# Patient Record
Sex: Female | Born: 1980 | Race: White | Hispanic: No | Marital: Married | Smoking: Current every day smoker
Health system: Southern US, Community
[De-identification: ages and names within clinical notes are randomized; demographics above are authoritative.]

## PROBLEM LIST (undated history)

## (undated) DIAGNOSIS — R32 Unspecified urinary incontinence: Secondary | ICD-10-CM

## (undated) DIAGNOSIS — E669 Obesity, unspecified: Secondary | ICD-10-CM

## (undated) DIAGNOSIS — E663 Overweight: Secondary | ICD-10-CM

## (undated) DIAGNOSIS — D649 Anemia, unspecified: Secondary | ICD-10-CM

## (undated) DIAGNOSIS — T7840XA Allergy, unspecified, initial encounter: Secondary | ICD-10-CM

## (undated) DIAGNOSIS — F329 Major depressive disorder, single episode, unspecified: Secondary | ICD-10-CM

## (undated) DIAGNOSIS — F419 Anxiety disorder, unspecified: Secondary | ICD-10-CM

## (undated) DIAGNOSIS — L732 Hidradenitis suppurativa: Secondary | ICD-10-CM

## (undated) DIAGNOSIS — R5383 Other fatigue: Secondary | ICD-10-CM

## (undated) DIAGNOSIS — K5900 Constipation, unspecified: Secondary | ICD-10-CM

## (undated) DIAGNOSIS — R05 Cough: Secondary | ICD-10-CM

## (undated) DIAGNOSIS — Z124 Encounter for screening for malignant neoplasm of cervix: Secondary | ICD-10-CM

## (undated) DIAGNOSIS — M2550 Pain in unspecified joint: Secondary | ICD-10-CM

## (undated) DIAGNOSIS — R1011 Right upper quadrant pain: Secondary | ICD-10-CM

## (undated) DIAGNOSIS — J209 Acute bronchitis, unspecified: Secondary | ICD-10-CM

## (undated) DIAGNOSIS — Z7251 High risk heterosexual behavior: Secondary | ICD-10-CM

## (undated) DIAGNOSIS — B019 Varicella without complication: Secondary | ICD-10-CM

## (undated) DIAGNOSIS — F319 Bipolar disorder, unspecified: Secondary | ICD-10-CM

## (undated) DIAGNOSIS — Z62819 Personal history of unspecified abuse in childhood: Secondary | ICD-10-CM

## (undated) DIAGNOSIS — E785 Hyperlipidemia, unspecified: Secondary | ICD-10-CM

## (undated) DIAGNOSIS — R5381 Other malaise: Secondary | ICD-10-CM

## (undated) DIAGNOSIS — Z62810 Personal history of physical and sexual abuse in childhood: Secondary | ICD-10-CM

## (undated) DIAGNOSIS — M79604 Pain in right leg: Secondary | ICD-10-CM

## (undated) DIAGNOSIS — F32A Depression, unspecified: Secondary | ICD-10-CM

## (undated) DIAGNOSIS — Z0000 Encounter for general adult medical examination without abnormal findings: Secondary | ICD-10-CM

## (undated) DIAGNOSIS — N643 Galactorrhea not associated with childbirth: Secondary | ICD-10-CM

## (undated) DIAGNOSIS — Z720 Tobacco use: Secondary | ICD-10-CM

## (undated) DIAGNOSIS — R4589 Other symptoms and signs involving emotional state: Secondary | ICD-10-CM

## (undated) HISTORY — DX: Bipolar disorder, unspecified: F31.9

## (undated) HISTORY — DX: Hyperlipidemia, unspecified: E78.5

## (undated) HISTORY — DX: Varicella without complication: B01.9

## (undated) HISTORY — DX: Pain in right leg: M79.604

## (undated) HISTORY — DX: Obesity, unspecified: E66.9

## (undated) HISTORY — DX: Unspecified urinary incontinence: R32

## (undated) HISTORY — DX: Personal history of physical and sexual abuse in childhood: Z62.810

## (undated) HISTORY — DX: Anemia, unspecified: D64.9

## (undated) HISTORY — DX: Other fatigue: R53.83

## (undated) HISTORY — DX: Anxiety disorder, unspecified: F41.9

## (undated) HISTORY — DX: Encounter for screening for malignant neoplasm of cervix: Z12.4

## (undated) HISTORY — DX: High risk heterosexual behavior: Z72.51

## (undated) HISTORY — DX: Right upper quadrant pain: R10.11

## (undated) HISTORY — DX: Overweight: E66.3

## (undated) HISTORY — DX: Constipation, unspecified: K59.00

## (undated) HISTORY — DX: Allergy, unspecified, initial encounter: T78.40XA

## (undated) HISTORY — DX: Galactorrhea not associated with childbirth: N64.3

## (undated) HISTORY — DX: Other malaise: R53.81

## (undated) HISTORY — DX: Major depressive disorder, single episode, unspecified: F32.9

## (undated) HISTORY — DX: Tobacco use: Z72.0

## (undated) HISTORY — DX: Other symptoms and signs involving emotional state: R45.89

## (undated) HISTORY — DX: Personal history of unspecified abuse in childhood: Z62.819

## (undated) HISTORY — DX: Encounter for general adult medical examination without abnormal findings: Z00.00

## (undated) HISTORY — DX: Hidradenitis suppurativa: L73.2

## (undated) HISTORY — DX: Cough: R05

## (undated) HISTORY — DX: Pain in unspecified joint: M25.50

## (undated) HISTORY — PX: WISDOM TOOTH EXTRACTION: SHX21

## (undated) HISTORY — DX: Acute bronchitis, unspecified: J20.9

## (undated) HISTORY — DX: Depression, unspecified: F32.A

---

## 2000-03-02 ENCOUNTER — Other Ambulatory Visit: Admission: RE | Admit: 2000-03-02 | Discharge: 2000-03-02 | Payer: Self-pay | Admitting: Family Medicine

## 2000-05-10 ENCOUNTER — Encounter: Payer: Self-pay | Admitting: Family Medicine

## 2000-05-10 ENCOUNTER — Ambulatory Visit (HOSPITAL_COMMUNITY): Admission: RE | Admit: 2000-05-10 | Discharge: 2000-05-10 | Payer: Self-pay | Admitting: Family Medicine

## 2000-05-22 ENCOUNTER — Encounter: Payer: Self-pay | Admitting: Family Medicine

## 2000-05-22 ENCOUNTER — Ambulatory Visit (HOSPITAL_COMMUNITY): Admission: RE | Admit: 2000-05-22 | Discharge: 2000-05-22 | Payer: Self-pay | Admitting: Family Medicine

## 2000-07-17 ENCOUNTER — Other Ambulatory Visit: Admission: RE | Admit: 2000-07-17 | Discharge: 2000-07-17 | Payer: Self-pay | Admitting: Family Medicine

## 2000-07-17 ENCOUNTER — Other Ambulatory Visit: Admission: RE | Admit: 2000-07-17 | Discharge: 2000-07-17 | Payer: Self-pay | Admitting: *Deleted

## 2000-08-22 ENCOUNTER — Encounter: Payer: Self-pay | Admitting: Family Medicine

## 2000-08-22 ENCOUNTER — Ambulatory Visit (HOSPITAL_COMMUNITY): Admission: RE | Admit: 2000-08-22 | Discharge: 2000-08-22 | Payer: Self-pay | Admitting: Family Medicine

## 2000-10-02 ENCOUNTER — Inpatient Hospital Stay (HOSPITAL_COMMUNITY): Admission: AD | Admit: 2000-10-02 | Discharge: 2000-10-02 | Payer: Self-pay | Admitting: Family Medicine

## 2000-11-07 ENCOUNTER — Inpatient Hospital Stay (HOSPITAL_COMMUNITY): Admission: AD | Admit: 2000-11-07 | Discharge: 2000-11-07 | Payer: Self-pay | Admitting: Family Medicine

## 2001-01-02 ENCOUNTER — Inpatient Hospital Stay (HOSPITAL_COMMUNITY): Admission: AD | Admit: 2001-01-02 | Discharge: 2001-01-02 | Payer: Self-pay | Admitting: Family Medicine

## 2001-01-05 ENCOUNTER — Inpatient Hospital Stay (HOSPITAL_COMMUNITY): Admission: AD | Admit: 2001-01-05 | Discharge: 2001-01-07 | Payer: Self-pay | Admitting: Family Medicine

## 2001-02-11 ENCOUNTER — Other Ambulatory Visit: Admission: RE | Admit: 2001-02-11 | Discharge: 2001-02-11 | Payer: Self-pay | Admitting: Family Medicine

## 2007-06-27 DIAGNOSIS — F319 Bipolar disorder, unspecified: Secondary | ICD-10-CM

## 2007-06-27 HISTORY — DX: Bipolar disorder, unspecified: F31.9

## 2008-04-08 ENCOUNTER — Inpatient Hospital Stay (HOSPITAL_COMMUNITY): Admission: RE | Admit: 2008-04-08 | Discharge: 2008-04-11 | Payer: Self-pay | Admitting: Psychiatry

## 2008-04-08 ENCOUNTER — Ambulatory Visit: Payer: Self-pay | Admitting: Psychiatry

## 2008-04-08 ENCOUNTER — Emergency Department (HOSPITAL_COMMUNITY): Admission: EM | Admit: 2008-04-08 | Discharge: 2008-04-08 | Payer: Self-pay | Admitting: Emergency Medicine

## 2009-03-26 HISTORY — PX: OTHER SURGICAL HISTORY: SHX169

## 2010-11-11 NOTE — Discharge Summary (Signed)
NAME:  Phyllis Washington, Phyllis Washington NO.:  [id number]   MEDICAL RECORD NO.:  [id number]          PATIENT TYPE:  IPS   LOCATION:  0303                          FACILITY:  [facility]   PHYSICIAN:  Anselm Jungling, MD  DATE OF BIRTH:  01-07-1981   DATE OF ADMISSION:  04/08/2008  DATE OF DISCHARGE:  04/11/2008                               DISCHARGE SUMMARY   IDENTIFYING DATA AND REASON FOR ADMISSION:  The patient is a 30 year old  Caucasian female who was admitted to the inpatient psychiatric service  via [facility].  The patient stated, I  was upset, it was a misunderstanding, I don't need to be here.  She did  admit to depression but denied any suicidal ideation, intent, or plan.  She had apparently made some significant suicide threats verbally, in  the presence of her husband prior to admission.  This was her first [facility]  admission.  She came to [place] on a regimen of Celexa, Haldol, and Xanax.  Please refer to the admission note for further details pertaining to the  symptoms, circumstances, and history that led to hospitalization.  She  was given an initial Axis I diagnosis of depressive disorder NOS.   MEDICAL AND LABORATORY:  The patient was medically and physically  assessed by the psychiatric nurse practitioner.  She was in good health  without any active or chronic medical problems.  There were no  significant medical issues.   HOSPITAL COURSE:  The patient was admitted to the adult inpatient  psychiatric service.  She presented as a well-nourished, normally-  developed female who appeared alert and fully-oriented.  She was  depressed, with anxious and blunted affect.  She made no overtly  delusional statements and did not appear to be psychotic.  She was  extremely focused on being discharged home as soon as possible and  minimized the statements she had made prior to admission that had led to  concern and her hospitalization.  She consented for her  family to be  contacted, and her husband was reached by the case manager.  The husband  indicated that the patient had been severely depressed and unable to  function at home over many months.  He appeared to be highly invested in  the patient getting treatment.  Although the patient continued to plea  to go home and signed a 72-hour release request form, it was felt that  she was desperately in need of inpatient care for her depression.  She  indicated that she did not want to take Haldol or Celexa any longer.  However, she consented to a trial of Risperdal in low doses, initially  at 0.25 mg t.i.d., to which she had a good response.   On the fourth hospital day, the patient still indicated that she wanted  to go home.  She had absent suicidal ideation throughout her entire  stay, was less depressed, and less anxious.  At this point, her husband  was in favor of her returning home.  She agreed to the following  aftercare plan.   AFTERCARE:  The  patient was to follow-up with Cl[facility] Ridges with an  appointment on April 11, 2008.   DISCHARGE MEDICATIONS:  1. Risperdal 0.25 mg t.i.d.  2. Xanax 1 mg every 6 hours p.r.n. anxiety.  3. Ambien 10 mg q.h.s. as needed for sleep.   DISCHARGE DIAGNOSES:  Axis I: Mood disorder NOS.  Axis II: Borderline personality traits.  Axis III: No acute or chronic illnesses.  Axis IV: Stressors severe.  Axis V: GAF on discharge 55.      Anselm Jungling, MD  Electronically Signed     SPB/MEDQ  D:  04/16/2008  T:  04/16/2008  Job:  [id number]

## 2011-03-28 LAB — CBC
HCT: 41.9
MCHC: 34.3
MCV: 89
Platelets: 270
RBC: 4.7
WBC: 10.5

## 2011-03-28 LAB — URINALYSIS, ROUTINE W REFLEX MICROSCOPIC
Bilirubin Urine: NEGATIVE
Glucose, UA: NEGATIVE
Hgb urine dipstick: NEGATIVE
Nitrite: NEGATIVE
Protein, ur: NEGATIVE
Specific Gravity, Urine: 1.01
Urobilinogen, UA: 0.2
pH: 6

## 2011-03-28 LAB — RAPID URINE DRUG SCREEN, HOSP PERFORMED
Amphetamines: NOT DETECTED
Barbiturates: NOT DETECTED
Benzodiazepines: POSITIVE — AB
Cocaine: NOT DETECTED
Opiates: NOT DETECTED
Tetrahydrocannabinol: POSITIVE — AB

## 2011-03-28 LAB — DIFFERENTIAL
Basophils Absolute: 0
Basophils Relative: 0
Eosinophils Absolute: 0.1
Eosinophils Relative: 1
Lymphocytes Relative: 20
Lymphs Abs: 2.1
Monocytes Absolute: 0.5
Monocytes Relative: 5
Neutro Abs: 7.9 — ABNORMAL HIGH
Neutrophils Relative %: 75

## 2011-03-28 LAB — PREGNANCY, URINE: Preg Test, Ur: NEGATIVE

## 2011-03-28 LAB — BASIC METABOLIC PANEL
BUN: 7
CO2: 25
Chloride: 105
Potassium: 3.7

## 2011-03-28 LAB — ETHANOL: Alcohol, Ethyl (B): <5

## 2012-04-16 ENCOUNTER — Encounter: Payer: Self-pay | Admitting: Family Medicine

## 2012-04-16 ENCOUNTER — Ambulatory Visit (INDEPENDENT_AMBULATORY_CARE_PROVIDER_SITE_OTHER): Payer: Medicare Other | Admitting: Family Medicine

## 2012-04-16 VITALS — BP 119/81 | HR 73 | Temp 97.6°F | Ht 66.0 in | Wt 220.4 lb

## 2012-04-16 DIAGNOSIS — E669 Obesity, unspecified: Secondary | ICD-10-CM | POA: Insufficient documentation

## 2012-04-16 DIAGNOSIS — Z1239 Encounter for other screening for malignant neoplasm of breast: Secondary | ICD-10-CM | POA: Insufficient documentation

## 2012-04-16 DIAGNOSIS — E663 Overweight: Secondary | ICD-10-CM

## 2012-04-16 DIAGNOSIS — M79609 Pain in unspecified limb: Secondary | ICD-10-CM

## 2012-04-16 DIAGNOSIS — Z0000 Encounter for general adult medical examination without abnormal findings: Secondary | ICD-10-CM

## 2012-04-16 DIAGNOSIS — Z62810 Personal history of physical and sexual abuse in childhood: Secondary | ICD-10-CM | POA: Insufficient documentation

## 2012-04-16 DIAGNOSIS — M79604 Pain in right leg: Secondary | ICD-10-CM | POA: Insufficient documentation

## 2012-04-16 DIAGNOSIS — Z23 Encounter for immunization: Secondary | ICD-10-CM

## 2012-04-16 DIAGNOSIS — Z62819 Personal history of unspecified abuse in childhood: Secondary | ICD-10-CM | POA: Insufficient documentation

## 2012-04-16 DIAGNOSIS — D649 Anemia, unspecified: Secondary | ICD-10-CM

## 2012-04-16 DIAGNOSIS — S82209A Unspecified fracture of shaft of unspecified tibia, initial encounter for closed fracture: Secondary | ICD-10-CM

## 2012-04-16 DIAGNOSIS — F319 Bipolar disorder, unspecified: Secondary | ICD-10-CM

## 2012-04-16 HISTORY — DX: Pain in right leg: M79.604

## 2012-04-16 HISTORY — DX: Encounter for general adult medical examination without abnormal findings: Z00.00

## 2012-04-16 NOTE — Assessment & Plan Note (Signed)
Presently has a therapist she is happy with and a psychiatrist who prescribes her meds. Her disability, Medicare and Medicaid have just been approved so she may have to change practitoners for insurance reasons.

## 2012-04-16 NOTE — Assessment & Plan Note (Signed)
Given handout on DASH diet and check labs including tsh, consider Phentermine if needed at next visit

## 2012-04-16 NOTE — Assessment & Plan Note (Signed)
Check CBC with fasting labs

## 2012-04-16 NOTE — Assessment & Plan Note (Signed)
Flu shot given today, tdap in 2010, has appt with GYN next week. Check fasting labs and see patient back in 1 month or sooner as needed.

## 2012-04-16 NOTE — Assessment & Plan Note (Signed)
Has not had insurance or medical care for several years. Her disability has been finaliized and she is in a great deal of pain still so we will refer to orthopaedics for further evaluation and treatment
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
## 2012-04-16 NOTE — Patient Instructions (Addendum)
Preventive Care for Adults, Female A healthy lifestyle and preventive care can promote health and wellness. Preventive health guidelines for women include the following key practices.  A routine yearly physical is a good way to check with your caregiver about your health and preventive screening. It is a chance to share any concerns and updates on your health, and to receive a thorough exam.  Visit your dentist for a routine exam and preventive care every 6 months. Brush your teeth twice a day and floss once a day. Good oral hygiene prevents tooth decay and gum disease.  The frequency of eye exams is based on your age, health, family medical history, use of contact lenses, and other factors. Follow your caregiver's recommendations for frequency of eye exams.  Eat a healthy diet. Foods like vegetables, fruits, whole grains, low-fat dairy products, and lean protein foods contain the nutrients you need without too many calories. Decrease your intake of foods high in solid fats, added sugars, and salt. Eat the right amount of calories for you.Get information about a proper diet from your caregiver, if necessary.  Regular physical exercise is one of the most important things you can do for your health. Most adults should get at least 150 minutes of moderate-intensity exercise (any activity that increases your heart rate and causes you to sweat) each week. In addition, most adults need muscle-strengthening exercises on 2 or more days a week.  Maintain a healthy weight. The body mass index (BMI) is a screening tool to identify possible weight problems. It provides an estimate of body fat based on height and weight. Your caregiver can help determine your BMI, and can help you achieve or maintain a healthy weight.For adults 20 years and older:  A BMI below 18.5 is considered underweight.  A BMI of 18.5 to 24.9 is normal.  A BMI of 25 to 29.9 is considered overweight.  A BMI of 30 and above is  considered obese.  Maintain normal blood lipids and cholesterol levels by exercising and minimizing your intake of saturated fat. Eat a balanced diet with plenty of fruit and vegetables. Blood tests for lipids and cholesterol should begin at age 20 and be repeated every 5 years. If your lipid or cholesterol levels are high, you are over 50, or you are at high risk for heart disease, you may need your cholesterol levels checked more frequently.Ongoing high lipid and cholesterol levels should be treated with medicines if diet and exercise are not effective.  If you smoke, find out from your caregiver how to quit. If you do not use tobacco, do not start.  If you are pregnant, do not drink alcohol. If you are breastfeeding, be very cautious about drinking alcohol. If you are not pregnant and choose to drink alcohol, do not exceed 1 drink per day. One drink is considered to be 12 ounces (355 mL) of beer, 5 ounces (148 mL) of wine, or 1.5 ounces (44 mL) of liquor.  Avoid use of street drugs. Do not share needles with anyone. Ask for help if you need support or instructions about stopping the use of drugs.  High blood pressure causes heart disease and increases the risk of stroke. Your blood pressure should be checked at least every 1 to 2 years. Ongoing high blood pressure should be treated with medicines if weight loss and exercise are not effective.  If you are 55 to 33 years old, ask your caregiver if you should take aspirin to prevent strokes.  Diabetes   screening involves taking a blood sample to check your fasting blood sugar level. This should be done once every 3 years, after age 45, if you are within normal weight and without risk factors for diabetes. Testing should be considered at a younger age or be carried out more frequently if you are overweight and have at least 1 risk factor for diabetes.  Breast cancer screening is essential preventive care for women. You should practice "breast  self-awareness." This means understanding the normal appearance and feel of your breasts and may include breast self-examination. Any changes detected, no matter how small, should be reported to a caregiver. Women in their 20s and 30s should have a clinical breast exam (CBE) by a caregiver as part of a regular health exam every 1 to 3 years. After age 40, women should have a CBE every year. Starting at age 40, women should consider having a mammography (breast X-ray test) every year. Women who have a family history of breast cancer should talk to their caregiver about genetic screening. Women at a high risk of breast cancer should talk to their caregivers about having magnetic resonance imaging (MRI) and a mammography every year.  The Pap test is a screening test for cervical cancer. A Pap test can show cell changes on the cervix that might become cervical cancer if left untreated. A Pap test is a procedure in which cells are obtained and examined from the lower end of the uterus (cervix).  Women should have a Pap test starting at age 21.  Between ages 21 and 29, Pap tests should be repeated every 2 years.  Beginning at age 30, you should have a Pap test every 3 years as long as the past 3 Pap tests have been normal.  Some women have medical problems that increase the chance of getting cervical cancer. Talk to your caregiver about these problems. It is especially important to talk to your caregiver if a new problem develops soon after your last Pap test. In these cases, your caregiver may recommend more frequent screening and Pap tests.  The above recommendations are the same for women who have or have not gotten the vaccine for human papillomavirus (HPV).  If you had a hysterectomy for a problem that was not cancer or a condition that could lead to cancer, then you no longer need Pap tests. Even if you no longer need a Pap test, a regular exam is a good idea to make sure no other problems are  starting.  If you are between ages 65 and 70, and you have had normal Pap tests going back 10 years, you no longer need Pap tests. Even if you no longer need a Pap test, a regular exam is a good idea to make sure no other problems are starting.  If you have had past treatment for cervical cancer or a condition that could lead to cancer, you need Pap tests and screening for cancer for at least 20 years after your treatment.  If Pap tests have been discontinued, risk factors (such as a new sexual partner) need to be reassessed to determine if screening should be resumed.  The HPV test is an additional test that may be used for cervical cancer screening. The HPV test looks for the virus that can cause the cell changes on the cervix. The cells collected during the Pap test can be tested for HPV. The HPV test could be used to screen women aged 30 years and older, and should   be used in women of any age who have unclear Pap test results. After the age of 30, women should have HPV testing at the same frequency as a Pap test.  Colorectal cancer can be detected and often prevented. Most routine colorectal cancer screening begins at the age of 50 and continues through age 75. However, your caregiver may recommend screening at an earlier age if you have risk factors for colon cancer. On a yearly basis, your caregiver may provide home test kits to check for hidden blood in the stool. Use of a small camera at the end of a tube, to directly examine the colon (sigmoidoscopy or colonoscopy), can detect the earliest forms of colorectal cancer. Talk to your caregiver about this at age 50, when routine screening begins. Direct examination of the colon should be repeated every 5 to 10 years through age 75, unless early forms of pre-cancerous polyps or small growths are found.  Hepatitis C blood testing is recommended for all people born from 1945 through 1965 and any individual with known risks for hepatitis C.  Practice  safe sex. Use condoms and avoid high-risk sexual practices to reduce the spread of sexually transmitted infections (STIs). STIs include gonorrhea, chlamydia, syphilis, trichomonas, herpes, HPV, and human immunodeficiency virus (HIV). Herpes, HIV, and HPV are viral illnesses that have no cure. They can result in disability, cancer, and death. Sexually active women aged 25 and younger should be checked for chlamydia. Older women with new or multiple partners should also be tested for chlamydia. Testing for other STIs is recommended if you are sexually active and at increased risk.  Osteoporosis is a disease in which the bones lose minerals and strength with aging. This can result in serious bone fractures. The risk of osteoporosis can be identified using a bone density scan. Women ages 65 and over and women at risk for fractures or osteoporosis should discuss screening with their caregivers. Ask your caregiver whether you should take a calcium supplement or vitamin D to reduce the rate of osteoporosis.  Menopause can be associated with physical symptoms and risks. Hormone replacement therapy is available to decrease symptoms and risks. You should talk to your caregiver about whether hormone replacement therapy is right for you.  Use sunscreen with sun protection factor (SPF) of 30 or more. Apply sunscreen liberally and repeatedly throughout the day. You should seek shade when your shadow is shorter than you. Protect yourself by wearing long sleeves, pants, a wide-brimmed hat, and sunglasses year round, whenever you are outdoors.  Once a month, do a whole body skin exam, using a mirror to look at the skin on your back. Notify your caregiver of new moles, moles that have irregular borders, moles that are larger than a pencil eraser, or moles that have changed in shape or color.  Stay current with required immunizations.  Influenza. You need a dose every fall (or winter). The composition of the flu vaccine  changes each year, so being vaccinated once is not enough.  Pneumococcal polysaccharide. You need 1 to 2 doses if you smoke cigarettes or if you have certain chronic medical conditions. You need 1 dose at age 65 (or older) if you have never been vaccinated.  Tetanus, diphtheria, pertussis (Tdap, Td). Get 1 dose of Tdap vaccine if you are younger than age 65, are over 65 and have contact with an infant, are a healthcare worker, are pregnant, or simply want to be protected from whooping cough. After that, you need a Td   booster dose every 10 years. Consult your caregiver if you have not had at least 3 tetanus and diphtheria-containing shots sometime in your life or have a deep or dirty wound.  HPV. You need this vaccine if you are a woman age 26 or younger. The vaccine is given in 3 doses over 6 months.  Measles, mumps, rubella (MMR). You need at least 1 dose of MMR if you were born in 1957 or later. You may also need a second dose.  Meningococcal. If you are age 19 to 21 and a first-year college student living in a residence hall, or have one of several medical conditions, you need to get vaccinated against meningococcal disease. You may also need additional booster doses.  Zoster (shingles). If you are age 60 or older, you should get this vaccine.  Varicella (chickenpox). If you have never had chickenpox or you were vaccinated but received only 1 dose, talk to your caregiver to find out if you need this vaccine.  Hepatitis A. You need this vaccine if you have a specific risk factor for hepatitis A virus infection or you simply wish to be protected from this disease. The vaccine is usually given as 2 doses, 6 to 18 months apart.  Hepatitis B. You need this vaccine if you have a specific risk factor for hepatitis B virus infection or you simply wish to be protected from this disease. The vaccine is given in 3 doses, usually over 6 months. Preventive Services / Frequency Ages 19 to 39  Blood  pressure check.** / Every 1 to 2 years.  Lipid and cholesterol check.** / Every 5 years beginning at age 20.  Clinical breast exam.** / Every 3 years for women in their 20s and 30s.  Pap test.** / Every 2 years from ages 21 through 29. Every 3 years starting at age 30 through age 65 or 70 with a history of 3 consecutive normal Pap tests.  HPV screening.** / Every 3 years from ages 30 through ages 65 to 70 with a history of 3 consecutive normal Pap tests.  Hepatitis C blood test.** / For any individual with known risks for hepatitis C.  Skin self-exam. / Monthly.  Influenza immunization.** / Every year.  Pneumococcal polysaccharide immunization.** / 1 to 2 doses if you smoke cigarettes or if you have certain chronic medical conditions.  Tetanus, diphtheria, pertussis (Tdap, Td) immunization. / A one-time dose of Tdap vaccine. After that, you need a Td booster dose every 10 years.  HPV immunization. / 3 doses over 6 months, if you are 26 and younger.  Measles, mumps, rubella (MMR) immunization. / You need at least 1 dose of MMR if you were born in 1957 or later. You may also need a second dose.  Meningococcal immunization. / 1 dose if you are age 19 to 21 and a first-year college student living in a residence hall, or have one of several medical conditions, you need to get vaccinated against meningococcal disease. You may also need additional booster doses.  Varicella immunization.** / Consult your caregiver.  Hepatitis A immunization.** / Consult your caregiver. 2 doses, 6 to 18 months apart.  Hepatitis B immunization.** / Consult your caregiver. 3 doses usually over 6 months. Ages 40 to 64  Blood pressure check.** / Every 1 to 2 years.  Lipid and cholesterol check.** / Every 5 years beginning at age 20.  Clinical breast exam.** / Every year after age 40.  Mammogram.** / Every year beginning at age 40   and continuing for as long as you are in good health. Consult with your  caregiver.  Pap test.** / Every 3 years starting at age 30 through age 65 or 70 with a history of 3 consecutive normal Pap tests.  HPV screening.** / Every 3 years from ages 30 through ages 65 to 70 with a history of 3 consecutive normal Pap tests.  Fecal occult blood test (FOBT) of stool. / Every year beginning at age 50 and continuing until age 75. You may not need to do this test if you get a colonoscopy every 10 years.  Flexible sigmoidoscopy or colonoscopy.** / Every 5 years for a flexible sigmoidoscopy or every 10 years for a colonoscopy beginning at age 50 and continuing until age 75.  Hepatitis C blood test.** / For all people born from 1945 through 1965 and any individual with known risks for hepatitis C.  Skin self-exam. / Monthly.  Influenza immunization.** / Every year.  Pneumococcal polysaccharide immunization.** / 1 to 2 doses if you smoke cigarettes or if you have certain chronic medical conditions.  Tetanus, diphtheria, pertussis (Tdap, Td) immunization.** / A one-time dose of Tdap vaccine. After that, you need a Td booster dose every 10 years.  Measles, mumps, rubella (MMR) immunization. / You need at least 1 dose of MMR if you were born in 1957 or later. You may also need a second dose.  Varicella immunization.** / Consult your caregiver.  Meningococcal immunization.** / Consult your caregiver.  Hepatitis A immunization.** / Consult your caregiver. 2 doses, 6 to 18 months apart.  Hepatitis B immunization.** / Consult your caregiver. 3 doses, usually over 6 months. Ages 65 and over  Blood pressure check.** / Every 1 to 2 years.  Lipid and cholesterol check.** / Every 5 years beginning at age 20.  Clinical breast exam.** / Every year after age 40.  Mammogram.** / Every year beginning at age 40 and continuing for as long as you are in good health. Consult with your caregiver.  Pap test.** / Every 3 years starting at age 30 through age 65 or 70 with a 3  consecutive normal Pap tests. Testing can be stopped between 65 and 70 with 3 consecutive normal Pap tests and no abnormal Pap or HPV tests in the past 10 years.  HPV screening.** / Every 3 years from ages 30 through ages 65 or 70 with a history of 3 consecutive normal Pap tests. Testing can be stopped between 65 and 70 with 3 consecutive normal Pap tests and no abnormal Pap or HPV tests in the past 10 years.  Fecal occult blood test (FOBT) of stool. / Every year beginning at age 50 and continuing until age 75. You may not need to do this test if you get a colonoscopy every 10 years.  Flexible sigmoidoscopy or colonoscopy.** / Every 5 years for a flexible sigmoidoscopy or every 10 years for a colonoscopy beginning at age 50 and continuing until age 75.  Hepatitis C blood test.** / For all people born from 1945 through 1965 and any individual with known risks for hepatitis C.  Osteoporosis screening.** / A one-time screening for women ages 65 and over and women at risk for fractures or osteoporosis.  Skin self-exam. / Monthly.  Influenza immunization.** / Every year.  Pneumococcal polysaccharide immunization.** / 1 dose at age 65 (or older) if you have never been vaccinated.  Tetanus, diphtheria, pertussis (Tdap, Td) immunization. / A one-time dose of Tdap vaccine if you are over   65 and have contact with an infant, are a healthcare worker, or simply want to be protected from whooping cough. After that, you need a Td booster dose every 10 years.  Varicella immunization.** / Consult your caregiver.  Meningococcal immunization.** / Consult your caregiver.  Hepatitis A immunization.** / Consult your caregiver. 2 doses, 6 to 18 months apart.  Hepatitis B immunization.** / Check with your caregiver. 3 doses, usually over 6 months. ** Family history and personal history of risk and conditions may change your caregiver's recommendations. Document Released: 08/08/2001 Document Revised: 09/04/2011  Document Reviewed: 11/07/2010 [employer] Patient Information 2013 [employer].  

## 2012-04-17 ENCOUNTER — Other Ambulatory Visit: Payer: Medicare Other

## 2012-04-18 ENCOUNTER — Other Ambulatory Visit (INDEPENDENT_AMBULATORY_CARE_PROVIDER_SITE_OTHER): Payer: Medicare Other

## 2012-04-18 DIAGNOSIS — D649 Anemia, unspecified: Secondary | ICD-10-CM

## 2012-04-18 DIAGNOSIS — E663 Overweight: Secondary | ICD-10-CM

## 2012-04-18 DIAGNOSIS — Z0000 Encounter for general adult medical examination without abnormal findings: Secondary | ICD-10-CM

## 2012-04-18 LAB — CBC
HCT: 40 % (ref 36.0–46.0)
MCV: 92.2 fl (ref 78.0–100.0)
Platelets: 257 10*3/uL (ref 150.0–400.0)
RBC: 4.34 Mil/uL (ref 3.87–5.11)

## 2012-04-19 LAB — HEPATIC FUNCTION PANEL
Bilirubin, Direct: 0 mg/dL (ref 0.0–0.3)
Total Bilirubin: 0.3 mg/dL (ref 0.3–1.2)

## 2012-04-19 LAB — TSH: TSH: 0.73 u[IU]/mL (ref 0.35–5.50)

## 2012-04-19 LAB — RENAL FUNCTION PANEL
Albumin: 3.7 g/dL (ref 3.5–5.2)
CO2: 18 mEq/L — ABNORMAL LOW (ref 19–32)
Calcium: 9 mg/dL (ref 8.4–10.5)
Chloride: 106 mEq/L (ref 96–112)
Phosphorus: 2.8 mg/dL (ref 2.3–4.6)
Potassium: 4.4 mEq/L (ref 3.5–5.1)

## 2012-04-19 LAB — LIPID PANEL
LDL Cholesterol: 119 mg/dL — ABNORMAL HIGH (ref 0–99)
Total CHOL/HDL Ratio: 4
VLDL: 18.6 mg/dL (ref 0.0–40.0)

## 2012-04-23 NOTE — Progress Notes (Signed)
Quick Note:  Patient Informed and voiced understanding ______ 

## 2012-04-24 ENCOUNTER — Ambulatory Visit: Payer: Medicare Other | Admitting: Family Medicine

## 2012-04-26 ENCOUNTER — Ambulatory Visit: Payer: Medicare Other | Admitting: Family Medicine

## 2012-05-08 ENCOUNTER — Encounter: Payer: Self-pay | Admitting: Family Medicine

## 2012-05-08 ENCOUNTER — Ambulatory Visit (INDEPENDENT_AMBULATORY_CARE_PROVIDER_SITE_OTHER): Payer: Medicare Other | Admitting: Family Medicine

## 2012-05-08 VITALS — BP 121/83 | HR 74 | Temp 98.2°F | Ht 66.0 in | Wt 222.8 lb

## 2012-05-08 DIAGNOSIS — Z720 Tobacco use: Secondary | ICD-10-CM | POA: Insufficient documentation

## 2012-05-08 DIAGNOSIS — E663 Overweight: Secondary | ICD-10-CM

## 2012-05-08 DIAGNOSIS — F17200 Nicotine dependence, unspecified, uncomplicated: Secondary | ICD-10-CM

## 2012-05-08 HISTORY — DX: Tobacco use: Z72.0

## 2012-05-08 MED ORDER — PHENTERMINE HCL 15 MG PO CAPS: 15.0000 mg | ORAL_CAPSULE | ORAL | Status: DC
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
## 2012-05-08 NOTE — Assessment & Plan Note (Signed)
Encouraged DASH diet, increased exercise, 8 hours of sleep and started on Phentermine 15 mg daily.

## 2012-05-08 NOTE — Patient Instructions (Addendum)
Start a probiotic such as [employer] by [employer] Avoid trans fats/partially hydrogenated oil 64 oz of clear fluids, avoid diet drinks Add Metamucil powder daily    Calorie Counting Diet A calorie counting diet requires you to eat the number of calories that are right for you in a day. Calories are the measurement of how much energy you get from the food you eat. Eating the right amount of calories is important for staying at a healthy weight. If you eat too many calories, your body will store them as fat and you may gain weight. If you eat too few calories, you may lose weight. Counting the number of calories you eat during a day will help you know if you are eating the right amount. A Registered Dietitian can determine how many calories you need in a day. The amount of calories needed varies from person to person. If your goal is to lose weight, you will need to eat fewer calories. Losing weight can benefit you if you are overweight or have health problems such as heart disease, high blood pressure, or diabetes. If your goal is to gain weight, you will need to eat more calories. Gaining weight may be necessary if you have a certain health problem that causes your body to need more energy. TIPS Whether you are increasing or decreasing the number of calories you eat during a day, it may be hard to get used to changes in what you eat and drink. The following are tips to help you keep track of the number of calories you eat.  Measure foods at home with measuring cups. This helps you know the amount of food and number of calories you are eating.  Restaurants often serve food in amounts that are larger than 1 serving. While eating out, estimate how many servings of a food you are given. For example, a serving of cooked rice is  cup or about the size of half of a fist. Knowing serving sizes will help you be aware of how much food you are eating at restaurants.  Ask for smaller portion sizes or  child-size portions at restaurants.  Plan to eat half of a meal at a restaurant. Take the rest home or share the other half with a friend.  Read the Nutrition Facts panel on food labels for calorie content and serving size. You can find out how many servings are in a package, the size of a serving, and the number of calories each serving has.  For example, a package might contain 3 cookies. The Nutrition Facts panel on that package says that 1 serving is 1 cookie. Below that, it will say there are 3 servings in the container. The calories section of the Nutrition Facts label says there are 90 calories. This means there are 90 calories in 1 cookie (1 serving). If you eat 1 cookie you have eaten 90 calories. If you eat all 3 cookies, you have eaten 270 calories (3 servings x 90 calories = 270 calories). The list below tells you how big or small some common portion sizes are.  1 oz.........4 stacked dice.  3 oz........Marland KitchenDeck of cards.  1 tsp.......Marland KitchenTip of little finger.  1 tbs......Marland KitchenMarland KitchenThumb.  2 tbs.......Marland KitchenGolf ball.   cup......Marland KitchenHalf of a fist.  1 cup.......Marland KitchenA fist. KEEP A FOOD LOG Write down every food item you eat, the amount you eat, and the number of calories in each food you eat during the day. At the end of the day, you can  add up the total number of calories you have eaten. It may help to keep a list like the one below. Find out the calorie information by reading the Nutrition Facts panel on food labels. Breakfast  Bran cereal (1 cup, 110 calories).  Fat-free milk ( cup, 45 calories). Snack  Apple (1 medium, 80 calories). Lunch  Spinach (1 cup, 20 calories).  Tomato ( medium, 20 calories).  Chicken breast strips (3 oz, 165 calories).  Shredded cheddar cheese ( cup, 110 calories).  Light [place] dressing (2 tbs, 60 calories).  Whole-wheat bread (1 slice, 80 calories).  Tub margarine (1 tsp, 35 calories).  Vegetable soup (1 cup, 160 calories). Dinner  Pork chop  (3 oz, 190 calories).  Brown rice (1 cup, 215 calories).  Steamed broccoli ( cup, 20 calories).  Strawberries (1  cup, 65 calories).  Whipped cream (1 tbs, 50 calories). Daily Calorie Total: 1425 Document Released: 06/12/2005 Document Revised: 09/04/2011 Document Reviewed: 12/07/2006 [facility] Patient Information 2013 [place], [place].

## 2012-05-08 NOTE — Assessment & Plan Note (Signed)
Discussed need for cessation 

## 2012-05-09 ENCOUNTER — Telehealth: Payer: Self-pay | Admitting: Family Medicine

## 2012-05-09 ENCOUNTER — Encounter: Payer: Self-pay | Admitting: Family Medicine

## 2012-05-09 NOTE — Telephone Encounter (Signed)
Patient needs to know what type of soap Dr Abner Greenspan wanted her to use. Is it OTC or does she need an Rx?

## 2012-05-09 NOTE — Telephone Encounter (Signed)
Cetaphil soap is over the counter and she can get it at any pharmacy

## 2012-05-09 NOTE — Telephone Encounter (Signed)
Advised patient

## 2012-05-29 ENCOUNTER — Ambulatory Visit (INDEPENDENT_AMBULATORY_CARE_PROVIDER_SITE_OTHER): Payer: Medicare Other | Admitting: Family Medicine

## 2012-05-29 ENCOUNTER — Encounter: Payer: Self-pay | Admitting: Family Medicine

## 2012-05-29 VITALS — BP 121/81 | HR 75 | Temp 97.8°F | Ht 66.0 in | Wt 221.0 lb

## 2012-05-29 DIAGNOSIS — E663 Overweight: Secondary | ICD-10-CM

## 2012-05-29 DIAGNOSIS — E669 Obesity, unspecified: Secondary | ICD-10-CM

## 2012-05-29 MED ORDER — PHENTERMINE HCL 37.5 MG PO CAPS: 37.5000 mg | ORAL_CAPSULE | ORAL | Status: DC

## 2012-05-29 NOTE — Patient Instructions (Addendum)
Walk

## 2012-05-29 NOTE — Assessment & Plan Note (Signed)
Has noted increased energy and weight loss secondary to decreased appetite starting phentermine 15. No concerning side effects. Will increase to phentermine 37.5 mg daily and reassess vital signs and symptoms and 3 weeks' time or as needed. Encouraged a daily walk and increase exercise as well as maintaining a low calorie high fiber and hi protein diet
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
## 2012-06-18 ENCOUNTER — Ambulatory Visit: Payer: Medicare Other | Admitting: Family Medicine

## 2012-06-29 ENCOUNTER — Other Ambulatory Visit: Payer: Self-pay | Admitting: Family Medicine

## 2012-07-01 NOTE — Telephone Encounter (Signed)
Please advise phentermine refill? Last RX was wrote on 05-29-12 quantity 30 with 0 refills

## 2012-07-02 ENCOUNTER — Ambulatory Visit (INDEPENDENT_AMBULATORY_CARE_PROVIDER_SITE_OTHER): Payer: Medicare Other | Admitting: Family Medicine

## 2012-07-02 ENCOUNTER — Encounter: Payer: Self-pay | Admitting: Family Medicine

## 2012-07-02 VITALS — BP 119/87 | HR 95 | Temp 98.0°F | Ht 66.0 in | Wt 215.1 lb

## 2012-07-02 DIAGNOSIS — Z720 Tobacco use: Secondary | ICD-10-CM

## 2012-07-02 DIAGNOSIS — F319 Bipolar disorder, unspecified: Secondary | ICD-10-CM

## 2012-07-02 DIAGNOSIS — E663 Overweight: Secondary | ICD-10-CM

## 2012-07-02 DIAGNOSIS — E669 Obesity, unspecified: Secondary | ICD-10-CM

## 2012-07-02 DIAGNOSIS — F17200 Nicotine dependence, unspecified, uncomplicated: Secondary | ICD-10-CM

## 2012-07-02 MED ORDER — PHENTERMINE HCL 37.5 MG PO CAPS: 37.5000 mg | ORAL_CAPSULE | ORAL | Status: DC

## 2012-07-02 NOTE — Patient Instructions (Signed)
Obesity Obesity is defined as having too much total body fat and a body mass index (BMI) of 30 or more. BMI is an estimate of body fat and is calculated from your height and weight. Obesity happens when you consume more calories than you can burn by exercising or performing daily physical tasks. Prolonged obesity can cause major illnesses or emergencies, such as:   A stroke.  Heart disease.  Diabetes.  Cancer.  Arthritis.  High blood pressure (hypertension).  High cholesterol.  Sleep apnea.  Erectile dysfunction.  Infertility problems. CAUSES   Regularly eating unhealthy foods.  Physical inactivity.  Certain disorders, such as an underactive thyroid (hypothyroidism), Cushing's syndrome, and polycystic ovarian syndrome.  Certain medicines, such as steroids, some depression medicines, and antipsychotics.  Genetics.  Lack of sleep. DIAGNOSIS  A caregiver can diagnose obesity after calculating your BMI. Obesity will be diagnosed if your BMI is 30 or higher.  There are other methods of measuring obesity levels. Some other methods include measuring your skin fold thickness, your waist circumference, and comparing your hip circumference to your waist circumference. TREATMENT  A healthy treatment program includes some or all of the following:  Long-term dietary changes.  Exercise and physical activity.  Behavioral and lifestyle changes.  Medicine only under the supervision of your caregiver. Medicines may help, but only if they are used with diet and exercise programs. An unhealthy treatment program includes:  Fasting.  Fad diets.  Supplements and drugs. These choices do not succeed in long-term weight control.  HOME CARE INSTRUCTIONS   Exercise and perform physical activity as directed by your caregiver. To increase physical activity, try the following:  Use stairs instead of elevators.  Park farther away from store entrances.  Garden, bike, or walk instead of  watching television or using the computer.  Eat healthy, low-calorie foods and drinks on a regular basis. Eat more fruits and vegetables. Use low-calorie cookbooks or take healthy cooking classes.  Limit fast food, sweets, and processed snack foods.  Eat smaller portions.  Keep a daily journal of everything you eat. There are many free websites to help you with this. It may be helpful to measure your foods so you can determine if you are eating the correct portion sizes.  Avoid drinking alcohol. Drink more water and drinks without calories.  Take vitamins and supplements only as recommended by your caregiver.  Weight-loss support groups, Registered Dieticians, counselors, and stress reduction education can also be very helpful. SEEK IMMEDIATE MEDICAL CARE IF:  You have chest pain or tightness.  You have trouble breathing or feel short of breath.  You have weakness or leg numbness.  You feel confused or have trouble talking.  You have sudden changes in your vision. MAKE SURE YOU:  Understand these instructions.  Will watch your condition.  Will get help right away if you are not doing well or get worse. Document Released: 07/20/2004 Document Revised: 12/12/2011 Document Reviewed: 07/19/2011 [employer] Patient Information 2013 [employer].  

## 2012-07-02 NOTE — Assessment & Plan Note (Signed)
Tolerating phentermine. Has had some headaches but they are not notably worse the usual just more frequent. She also had a change in her wrist no level, a great deal of stress over the holidays and other factors which may be contributing. At this point she would like to maintain the phentermine at 37.5 mg and she is allowed to do so. If she has any further trouble she will let us know. Encouraged DASH diet and increased exercise as well.

## 2012-07-02 NOTE — Assessment & Plan Note (Signed)
Is encouraged to consider any cigarette and trying to cut back further. Agrees to try
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
## 2012-07-02 NOTE — Assessment & Plan Note (Signed)
Her psychiatrist Dr. Valerie Roys to 1 mg twice a day and she felt like this contributed to her increase in headaches so she went back up to 3 times a day and has not noted a great improvement thus far. Does see psychiatry soon

## 2012-10-02 ENCOUNTER — Ambulatory Visit (INDEPENDENT_AMBULATORY_CARE_PROVIDER_SITE_OTHER): Payer: Medicare Other | Admitting: Family Medicine

## 2012-10-02 ENCOUNTER — Encounter: Payer: Self-pay | Admitting: Family Medicine

## 2012-10-02 VITALS — BP 121/85 | HR 101 | Temp 98.2°F | Ht 66.0 in | Wt 217.4 lb

## 2012-10-02 DIAGNOSIS — Z720 Tobacco use: Secondary | ICD-10-CM

## 2012-10-02 DIAGNOSIS — F341 Dysthymic disorder: Secondary | ICD-10-CM

## 2012-10-02 DIAGNOSIS — R4589 Other symptoms and signs involving emotional state: Secondary | ICD-10-CM

## 2012-10-02 DIAGNOSIS — F329 Major depressive disorder, single episode, unspecified: Secondary | ICD-10-CM

## 2012-10-02 DIAGNOSIS — F17200 Nicotine dependence, unspecified, uncomplicated: Secondary | ICD-10-CM

## 2012-10-02 DIAGNOSIS — F418 Other specified anxiety disorders: Secondary | ICD-10-CM | POA: Insufficient documentation

## 2012-10-02 DIAGNOSIS — E669 Obesity, unspecified: Secondary | ICD-10-CM

## 2012-10-02 MED ORDER — ESCITALOPRAM OXALATE 20 MG PO TABS: 20.0000 mg | ORAL_TABLET | Freq: Two times a day (BID) | ORAL | Status: DC

## 2012-10-02 NOTE — Assessment & Plan Note (Addendum)
Has just gotten lights set up in her house this week and is going to use them routinely. Is crying in visit, will d/c Celexa and try Escitalopram 20 mg po bid

## 2012-10-02 NOTE — Assessment & Plan Note (Signed)
Still smoking but has cut down from 5 cig today

## 2012-10-02 NOTE — Patient Instructions (Addendum)
Seasonal Affective Disorder  A seasonal affective disorder is a depressive reaction. It is when you feel emotionally down, which seems to come at specific times of the year. The most common time of year for this is winter. Otherwise, it behaves like a plain depression. As with other depressive disorders, there are:  Crying episodes.  Headaches.  Irritability.  Loss of energy. DIAGNOSIS  The diagnosis of this problem is usually made by the history (what has been going on). A physical exam may be done to make sure there is no other cause of your depression. TREATMENT  The treatment of seasonal affective disorders has been found to be helped immensely by photo-therapy. This means a person sits or lies for several hours per day in front of or under bright lights. The symptoms (problems) of depression respond rapidly, usually over a couple days. HOME CARE INSTRUCTIONS   Follow your caregiver's instructions for light therapy.  You must be awake during the light therapy.  If you do not respond or you feel you are getting worse, see your caregiver. Document Released: 03/07/2001 Document Revised: 09/04/2011 Document Reviewed: 09/28/2005 [facility] Patient Information 2013 [place], [place].
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
## 2012-10-03 ENCOUNTER — Telehealth: Payer: Self-pay | Admitting: Family Medicine

## 2012-10-03 NOTE — Telephone Encounter (Signed)
PRECERT STARTED WITH [employer] FOR ESCITALOPRAM 20 MG TABLET  [employer] WILL ADVISE WITHIN 48 HOURS BY FAX

## 2012-10-07 NOTE — Telephone Encounter (Signed)
PA was denied stating that the claim was missing information. Paperwork put on MD's desk.

## 2012-10-14 ENCOUNTER — Telehealth: Payer: Self-pay | Admitting: *Deleted

## 2012-10-14 DIAGNOSIS — F329 Major depressive disorder, single episode, unspecified: Secondary | ICD-10-CM

## 2012-10-14 MED ORDER — ESCITALOPRAM OXALATE 20 MG PO TABS: 20.0000 mg | ORAL_TABLET | Freq: Every day | ORAL | Status: DC

## 2012-10-14 NOTE — Addendum Note (Signed)
Addended by: Court Joy on: 10/14/2012 12:25 PM   Modules accepted: Orders

## 2012-10-14 NOTE — Telephone Encounter (Signed)
Patient called upset because she is having a problem getting her lexapro rx filled. Please give patient a call back? Thanks.

## 2012-10-14 NOTE — Telephone Encounter (Signed)
Pt informed that we are having to fill out an appeal due to insurance not wanting to pay for 1 Lexapro bid. Per md send in 30 tabs take 1 daily and have pt take 1 tab bid until md gets insurance paperwork done.   RX sent

## 2012-12-03 ENCOUNTER — Encounter: Payer: Self-pay | Admitting: Family Medicine

## 2012-12-03 ENCOUNTER — Ambulatory Visit (INDEPENDENT_AMBULATORY_CARE_PROVIDER_SITE_OTHER): Payer: Medicare Other | Admitting: Family Medicine

## 2012-12-03 VITALS — BP 122/82 | HR 82 | Temp 98.2°F | Ht 66.0 in | Wt 222.1 lb

## 2012-12-03 DIAGNOSIS — F319 Bipolar disorder, unspecified: Secondary | ICD-10-CM

## 2012-12-03 DIAGNOSIS — E669 Obesity, unspecified: Secondary | ICD-10-CM

## 2012-12-03 DIAGNOSIS — F17200 Nicotine dependence, unspecified, uncomplicated: Secondary | ICD-10-CM

## 2012-12-03 DIAGNOSIS — Z720 Tobacco use: Secondary | ICD-10-CM

## 2012-12-03 MED ORDER — PHENTERMINE HCL 15 MG PO CAPS: 15.0000 mg | ORAL_CAPSULE | ORAL | Status: DC

## 2012-12-03 NOTE — Patient Instructions (Addendum)
DASH Diet  The DASH diet stands for "Dietary Approaches to Stop Hypertension." It is a healthy eating plan that has been shown to reduce high blood pressure (hypertension) in as little as 14 days, while also possibly providing other significant health benefits. These other health benefits include reducing the risk of breast cancer after menopause and reducing the risk of type 2 diabetes, heart disease, colon cancer, and stroke. Health benefits also include weight loss and slowing kidney failure in patients with chronic kidney disease.   DIET GUIDELINES  · Limit salt (sodium). Your diet should contain less than 1500 mg of sodium daily.  · Limit refined or processed carbohydrates. Your diet should include mostly whole grains. Desserts and added sugars should be used sparingly.  · Include small amounts of heart-healthy fats. These types of fats include nuts, oils, and tub margarine. Limit saturated and trans fats. These fats have been shown to be harmful in the body.  CHOOSING FOODS   The following food groups are based on a 2000 calorie diet. See your Registered Dietitian for individual calorie needs.  Grains and Grain Products (6 to 8 servings daily)  · Eat More Often: Whole-wheat bread, brown rice, whole-grain or wheat pasta, quinoa, popcorn without added fat or salt (air popped).  · Eat Less Often: White bread, white pasta, white rice, cornbread.  Vegetables (4 to 5 servings daily)  · Eat More Often: Fresh, frozen, and canned vegetables. Vegetables may be raw, steamed, roasted, or grilled with a minimal amount of fat.  · Eat Less Often/Avoid: Creamed or fried vegetables. Vegetables in a cheese sauce.  Fruit (4 to 5 servings daily)  · Eat More Often: All fresh, canned (in natural juice), or frozen fruits. Dried fruits without added sugar. One hundred percent fruit juice (½ cup [237 mL] daily).  · Eat Less Often: Dried fruits with added sugar. Canned fruit in light or heavy syrup.  Lean Meats, Fish, and Poultry (2  servings or less daily. One serving is 3 to 4 oz [85-114 g]).  · Eat More Often: Ninety percent or leaner ground beef, tenderloin, sirloin. Round cuts of beef, chicken breast, turkey breast. All fish. Grill, bake, or broil your meat. Nothing should be fried.  · Eat Less Often/Avoid: Fatty cuts of meat, turkey, or chicken leg, thigh, or wing. Fried cuts of meat or fish.  Dairy (2 to 3 servings)  · Eat More Often: Low-fat or fat-free milk, low-fat plain or light yogurt, reduced-fat or part-skim cheese.  · Eat Less Often/Avoid: Milk (whole, 2%). Whole milk yogurt. Full-fat cheeses.  Nuts, Seeds, and Legumes (4 to 5 servings per week)  · Eat More Often: All without added salt.  · Eat Less Often/Avoid: Salted nuts and seeds, canned beans with added salt.  Fats and Sweets (limited)  · Eat More Often: Vegetable oils, tub margarines without trans fats, sugar-free gelatin. Mayonnaise and salad dressings.  · Eat Less Often/Avoid: Coconut oils, palm oils, butter, stick margarine, cream, half and half, cookies, candy, pie.  FOR MORE INFORMATION  The Dash Diet Eating Plan: www.dashdiet.org  Document Released: 06/01/2011 Document Revised: 09/04/2011 Document Reviewed: 06/01/2011  [employer]® Patient Information ©2014 [employer].

## 2012-12-07 NOTE — Assessment & Plan Note (Signed)
Unfortunately continues to smoke but has not increased again, encouraged cessation
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
## 2012-12-07 NOTE — Assessment & Plan Note (Signed)
Feels she has done well with the increase in her Lexapro. No further changes today

## 2013-01-07 ENCOUNTER — Ambulatory Visit: Payer: Medicare Other | Admitting: Family Medicine

## 2013-01-22 ENCOUNTER — Encounter: Payer: Self-pay | Admitting: Family Medicine

## 2013-01-22 ENCOUNTER — Ambulatory Visit (INDEPENDENT_AMBULATORY_CARE_PROVIDER_SITE_OTHER): Payer: Medicare Other | Admitting: Family Medicine

## 2013-01-22 VITALS — BP 110/80 | HR 69 | Temp 97.8°F | Ht 66.0 in | Wt 213.1 lb

## 2013-01-22 DIAGNOSIS — F319 Bipolar disorder, unspecified: Secondary | ICD-10-CM

## 2013-01-22 DIAGNOSIS — F17200 Nicotine dependence, unspecified, uncomplicated: Secondary | ICD-10-CM

## 2013-01-22 DIAGNOSIS — E663 Overweight: Secondary | ICD-10-CM

## 2013-01-22 DIAGNOSIS — Z720 Tobacco use: Secondary | ICD-10-CM

## 2013-01-22 DIAGNOSIS — E669 Obesity, unspecified: Secondary | ICD-10-CM

## 2013-01-22 MED ORDER — PHENTERMINE HCL 15 MG PO CAPS: 15.0000 mg | ORAL_CAPSULE | ORAL | Status: DC

## 2013-01-22 NOTE — Assessment & Plan Note (Signed)
Has increased slightly to 1/2 to 1 ppd, encouraged cessation or at least cutting down again

## 2013-01-22 NOTE — Patient Instructions (Addendum)
  Good job continue exercising  DASH Diet The DASH diet stands for "Dietary Approaches to Stop Hypertension." It is a healthy eating plan that has been shown to reduce high blood pressure (hypertension) in as little as 14 days, while also possibly providing other significant health benefits. These other health benefits include reducing the risk of breast cancer after menopause and reducing the risk of type 2 diabetes, heart disease, colon cancer, and stroke. Health benefits also include weight loss and slowing kidney failure in patients with chronic kidney disease.  DIET GUIDELINES  Limit salt (sodium). Your diet should contain less than 1500 mg of sodium daily.  Limit refined or processed carbohydrates. Your diet should include mostly whole grains. Desserts and added sugars should be used sparingly.  Include small amounts of heart-healthy fats. These types of fats include nuts, oils, and tub margarine. Limit saturated and trans fats. These fats have been shown to be harmful in the body. CHOOSING FOODS  The following food groups are based on a 2000 calorie diet. See your Registered Dietitian for individual calorie needs. Grains and Grain Products (6 to 8 servings daily)  Eat More Often: Whole-wheat bread, brown rice, whole-grain or wheat pasta, quinoa, popcorn without added fat or salt (air popped).  Eat Less Often: White bread, white pasta, white rice, cornbread. Vegetables (4 to 5 servings daily)  Eat More Often: Fresh, frozen, and canned vegetables. Vegetables may be raw, steamed, roasted, or grilled with a minimal amount of fat.  Eat Less Often/Avoid: Creamed or fried vegetables. Vegetables in a cheese sauce. Fruit (4 to 5 servings daily)  Eat More Often: All fresh, canned (in natural juice), or frozen fruits. Dried fruits without added sugar. One hundred percent fruit juice ( cup [237 mL] daily).  Eat Less Often: Dried fruits with added sugar. Canned fruit in light or heavy  syrup. [employer], Fish, and Poultry (2 servings or less daily. One serving is 3 to 4 oz [85-114 g]).  Eat More Often: Ninety percent or leaner ground beef, tenderloin, sirloin. Round cuts of beef, chicken breast, [place] breast. All fish. Grill, bake, or broil your meat. Nothing should be fried.  Eat Less Often/Avoid: Fatty cuts of meat, [place], or chicken leg, thigh, or wing. Fried cuts of meat or fish. Dairy (2 to 3 servings)  Eat More Often: Low-fat or fat-free milk, low-fat plain or light yogurt, reduced-fat or part-skim cheese.  Eat Less Often/Avoid: Milk (whole, 2%).Whole milk yogurt. Full-fat cheeses. Nuts, Seeds, and Legumes (4 to 5 servings per week)  Eat More Often: All without added salt.  Eat Less Often/Avoid: Salted nuts and seeds, canned beans with added salt. Fats and Sweets (limited)  Eat More Often: Vegetable oils, tub margarines without trans fats, sugar-free gelatin. Mayonnaise and salad dressings.  Eat Less Often/Avoid: Coconut oils, palm oils, butter, stick margarine, cream, half and half, cookies, candy, pie. FOR MORE INFORMATION The Dash Diet Eating Plan: www.dashdiet.org Document Released: 06/01/2011 Document Revised: 09/04/2011 Document Reviewed: 06/01/2011 [facility] Patient Information 2014 [place], [place].

## 2013-01-22 NOTE — Assessment & Plan Note (Signed)
Feeling better on current meds. No concerns at today's visit

## 2013-01-22 NOTE — Assessment & Plan Note (Signed)
Good weight loss with low dose Phentermine. Given a 3 month supply. Has started walking and the DASH diet and is doing well, encouraged the same
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
## 2013-03-19 ENCOUNTER — Telehealth: Payer: Self-pay | Admitting: Family Medicine

## 2013-03-19 NOTE — Telephone Encounter (Signed)
Patient informed and voiced understanding

## 2013-03-19 NOTE — Telephone Encounter (Signed)
Please advise 

## 2013-03-19 NOTE — Telephone Encounter (Signed)
Patient states that her psychiatrist has put her on prazosin 1mg  and would like to know if this medication would interact with the other medications that she is taking?

## 2013-03-19 NOTE — Telephone Encounter (Signed)
No interactions with Prazosin, OK to take

## 2013-04-22 ENCOUNTER — Ambulatory Visit (INDEPENDENT_AMBULATORY_CARE_PROVIDER_SITE_OTHER): Payer: Medicare Other | Admitting: Family Medicine

## 2013-04-22 ENCOUNTER — Encounter: Payer: Self-pay | Admitting: Family Medicine

## 2013-04-22 VITALS — BP 110/82 | HR 66 | Temp 97.8°F | Ht 66.0 in | Wt 214.0 lb

## 2013-04-22 DIAGNOSIS — R5383 Other fatigue: Secondary | ICD-10-CM | POA: Insufficient documentation

## 2013-04-22 DIAGNOSIS — E785 Hyperlipidemia, unspecified: Secondary | ICD-10-CM

## 2013-04-22 DIAGNOSIS — R5381 Other malaise: Secondary | ICD-10-CM

## 2013-04-22 DIAGNOSIS — Z0000 Encounter for general adult medical examination without abnormal findings: Secondary | ICD-10-CM

## 2013-04-22 DIAGNOSIS — Z720 Tobacco use: Secondary | ICD-10-CM

## 2013-04-22 DIAGNOSIS — F17200 Nicotine dependence, unspecified, uncomplicated: Secondary | ICD-10-CM

## 2013-04-22 DIAGNOSIS — E782 Mixed hyperlipidemia: Secondary | ICD-10-CM | POA: Insufficient documentation

## 2013-04-22 DIAGNOSIS — E663 Overweight: Secondary | ICD-10-CM

## 2013-04-22 DIAGNOSIS — Z23 Encounter for immunization: Secondary | ICD-10-CM

## 2013-04-22 DIAGNOSIS — E669 Obesity, unspecified: Secondary | ICD-10-CM

## 2013-04-22 DIAGNOSIS — F319 Bipolar disorder, unspecified: Secondary | ICD-10-CM

## 2013-04-22 HISTORY — DX: Hyperlipidemia, unspecified: E78.5

## 2013-04-22 HISTORY — DX: Other malaise: R53.81

## 2013-04-22 LAB — RENAL FUNCTION PANEL
BUN: 7 mg/dL (ref 6–23)
Calcium: 8.9 mg/dL (ref 8.4–10.5)
Glucose, Bld: 97 mg/dL (ref 70–99)
Potassium: 4.6 mEq/L (ref 3.5–5.3)

## 2013-04-22 LAB — CBC
HCT: 38.1 % (ref 36.0–46.0)
Hemoglobin: 13 g/dL (ref 12.0–15.0)
MCHC: 34.1 g/dL (ref 30.0–36.0)

## 2013-04-22 LAB — LIPID PANEL
Cholesterol: 238 mg/dL — ABNORMAL HIGH (ref 0–200)
LDL Cholesterol: 154 mg/dL — ABNORMAL HIGH (ref 0–99)
VLDL: 21 mg/dL (ref 0–40)

## 2013-04-22 LAB — HEPATIC FUNCTION PANEL
ALT: 16 U/L (ref 0–35)
AST: 16 U/L (ref 0–37)
Alkaline Phosphatase: 74 U/L (ref 39–117)
Indirect Bilirubin: 0.2 mg/dL (ref 0.0–0.9)
Total Protein: 6.3 g/dL (ref 6.0–8.3)

## 2013-04-22 MED ORDER — PHENTERMINE HCL 30 MG PO CAPS: 30.0000 mg | ORAL_CAPSULE | ORAL | Status: DC

## 2013-04-22 NOTE — Assessment & Plan Note (Signed)
Multifactorial, likely contributed to by meds and depression but will repeat annual labs as well, encouraged adequate sleep and increased exercise

## 2013-04-22 NOTE — Assessment & Plan Note (Signed)
Less than 1 ppd, encouraged ongoing efforts at quitting.

## 2013-04-22 NOTE — Patient Instructions (Signed)

## 2013-04-22 NOTE — Assessment & Plan Note (Signed)
Following with psychiatry still struggling with depression, encouraged increased exercise

## 2013-04-22 NOTE — Assessment & Plan Note (Signed)
Check lipids, avoid trans fats
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
## 2013-04-22 NOTE — Assessment & Plan Note (Addendum)
Is frustrated with lack of weight loss, no concerning side effects withPhentermine, will try increasing dose. Reassess next month. Encouraged carb counting. Increase exercise

## 2013-04-25 ENCOUNTER — Telehealth: Payer: Self-pay

## 2013-04-25 NOTE — Telephone Encounter (Signed)
Patient informed of results.  

## 2013-04-25 NOTE — Telephone Encounter (Signed)
Message copied by Eulis Manly on Fri Apr 25, 2013  4:18 PM ------      Message from: Danise Edge A      Created: Tue Apr 22, 2013  6:25 PM       Notify labs look good so far, cbc pending all else normal except cholesterol is up again, we will discuss options for that at next ------

## 2013-05-19 ENCOUNTER — Encounter: Payer: Self-pay | Admitting: Family Medicine

## 2013-05-19 ENCOUNTER — Ambulatory Visit (INDEPENDENT_AMBULATORY_CARE_PROVIDER_SITE_OTHER): Payer: Medicare Other | Admitting: Family Medicine

## 2013-05-19 VITALS — BP 102/82 | HR 95 | Temp 98.3°F | Ht 66.0 in | Wt 212.0 lb

## 2013-05-19 DIAGNOSIS — Z720 Tobacco use: Secondary | ICD-10-CM

## 2013-05-19 DIAGNOSIS — M25571 Pain in right ankle and joints of right foot: Secondary | ICD-10-CM

## 2013-05-19 DIAGNOSIS — M79604 Pain in right leg: Secondary | ICD-10-CM

## 2013-05-19 DIAGNOSIS — F17200 Nicotine dependence, unspecified, uncomplicated: Secondary | ICD-10-CM

## 2013-05-19 DIAGNOSIS — E669 Obesity, unspecified: Secondary | ICD-10-CM

## 2013-05-19 DIAGNOSIS — M79609 Pain in unspecified limb: Secondary | ICD-10-CM

## 2013-05-19 DIAGNOSIS — M25579 Pain in unspecified ankle and joints of unspecified foot: Secondary | ICD-10-CM

## 2013-05-19 MED ORDER — TRAMADOL HCL 50 MG PO TABS: 50.0000 mg | ORAL_TABLET | Freq: Two times a day (BID) | ORAL | Status: DC | PRN

## 2013-05-19 NOTE — Assessment & Plan Note (Signed)
Has been eating better and tolerating Phentermine but acknowledges that she is not exercising and knows for the holidays she will not eat right. So we will stop Phentermine for now and reconsider in spring. Encouraged DASH diet

## 2013-05-19 NOTE — Assessment & Plan Note (Signed)
Has cut back considerably with vapor cig, encouraged ongoing efforts at complete cessation

## 2013-05-19 NOTE — Assessment & Plan Note (Signed)
Continue Ibuprofen 400 mg prn and can try topical treatments as well. Given a small amount of Tramadol to use prn. Call if worsens

## 2013-05-19 NOTE — Progress Notes (Signed)
Pre visit review using our clinic review tool, if applicable. No additional management support is needed unless otherwise documented below in the visit note. 

## 2013-05-19 NOTE — Patient Instructions (Signed)
Nicotine Addiction Nicotine can act as both a stimulant (excites/activates) and a sedative (calms/quiets). Immediately after exposure to nicotine, there is a "kick" caused in part by the drug's stimulation of the adrenal glands and resulting discharge of adrenaline (epinephrine). The rush of adrenaline stimulates the body and causes a sudden release of sugar. This means that smokers are always slightly hyperglycemic. Hyperglycemic means that the blood sugar is high, just like in diabetics. Nicotine also decreases the amount of insulin which helps control sugar levels in the body. There is an increase in blood pressure, breathing, and the rate of heart beats.  In addition, nicotine indirectly causes a release of dopamine in the brain that controls pleasure and motivation. A similar reaction is seen with other drugs of abuse, such as cocaine and heroin. This dopamine release is thought to cause the pleasurable sensations when smoking. In some different cases, nicotine can also create a calming effect, depending on sensitivity of the smoker's nervous system and the dose of nicotine taken. WHAT HAPPENS WHEN NICOTINE IS TAKEN FOR LONG PERIODS OF TIME?  Long-term use of nicotine results in addiction. It is difficult to stop.  Repeated use of nicotine creates tolerance. Higher doses of nicotine are needed to get the "kick." When nicotine use is stopped, withdrawal may last a month or more. Withdrawal may begin within a few hours after the last cigarette. Symptoms peak within the first few days and may lessen within a few weeks. For some people, however, symptoms may last for months or longer. Withdrawal symptoms include:   Irritability.  Craving.  Learning and attention deficits.  Sleep disturbances.  Increased appetite. Craving for tobacco may last for 6 months or longer. Many behaviors done while using nicotine can also play a part in the severity of withdrawal symptoms. For some people, the feel,  smell, and sight of a cigarette and the ritual of obtaining, handling, lighting, and smoking the cigarette are closely linked with the pleasure of smoking. When stopped, they also miss the related behaviors which make the withdrawal or craving worse. While nicotine gum and patches may lessen the drug aspects of withdrawal, cravings often persist. WHAT ARE THE MEDICAL CONSEQUENCES OF NICOTINE USE?  Nicotine addiction accounts for one-third of all cancers. The top cancer caused by tobacco is lung cancer. Lung cancer is the number one cancer killer of both men and women.  Smoking is also associated with cancers of the:  Mouth.  Pharynx.  Larynx.  Esophagus.  Stomach.  Pancreas.  Cervix.  Kidney.  Ureter.  Bladder.  Smoking also causes lung diseases such as lasting (chronic) bronchitis and emphysema.  It worsens asthma in adults and children.  Smoking increases the risk of heart disease, including:  Stroke.  Heart attack.  Vascular disease.  Aneurysm.  Passive or secondary smoke can also increase medical risks including:  Asthma in children.  Sudden Infant Death Syndrome (SIDS).  Additionally, dropped cigarettes are the leading cause of residential fire fatalities.  Nicotine poisoning has been reported from accidental ingestion of tobacco products by children and pets. Death usually results in a few minutes from respiratory failure (when a person stops breathing) caused by paralysis. TREATMENT   Medication. Nicotine replacement medicines such as nicotine gum and the patch are used to stop smoking. These medicines gradually lower the dosage of nicotine in the body. These medicines do not contain the carbon monoxide and other toxins found in tobacco smoke.  Hypnotherapy.  Relaxation therapy.  Nicotine Anonymous (a 12-step support   program). Find times and locations in your local yellow pages. Document Released: 02/16/2004 Document Revised: 09/04/2011 Document  Reviewed: 07/10/2007 [facility] Patient Information 2014 [employer], [place]. Basic Carbohydrate Counting Basic carbohydrate counting is a way to plan meals. It is done by counting the amount of carbohydrate in foods. Foods that have carbohydrates are starches (grains, beans, starchy vegetables) and sweets. Eating carbohydrates increases blood glucose (sugar) levels. People with diabetes use carbohydrate counting to help keep their blood glucose at a normal level.  COUNTING CARBOHYDRATES IN FOODS The first step in counting carbohydrates is to learn how many carbohydrate servings you should have in every meal. A dietitian can plan this for you. After learning the amount of carbohydrates to include in your meal plan, you can start to choose the carbohydrate-containing foods you want to eat.  There are 2 ways to identify the amount of carbohydrates in the foods you eat.  Read the Nutrition Facts panel on food labels. You need 2 pieces of information from the Nutrition Facts panel to count carbohydrates this way:  Serving size.  Total carbohydrate (in grams). Decide how many servings you will be eating. If it is 1 serving, you will be eating the amount of carbohydrate listed on the panel. If you will be eating 2 servings, you will be eating double the amount of carbohydrate listed on the panel.   Learn serving sizes. A serving size of most carbohydrate-containing foods is about 15 grams (g). Listed below are single serving sizes of common carbohydrate-containing foods:  1 slice bread.   cup unsweetened, dry cereal.   cup hot cereal.   cup rice.   cup mashed potatoes.   cup pasta.  1 cup fresh fruit.   cup canned fruit.  1 cup milk (whole, 2%, or skim).   cup starchy vegetables (peas, corn, or potatoes). Counting carbohydrates this way is similar to looking on the Nutrition Facts panel. Decide how many servings you will eat first. Multiply the number of servings you eat by 15 g. For  example, if you have 2 cups of strawberries, you had 2 servings. That means you had 30 g of carbohydrate (2 servings x 15 g = 30 g). CALCULATING CARBOHYDRATES IN A MEAL Sample dinner  3 oz chicken breast.   cup brown rice.   cup corn.  1 cup fat-free milk.  1 cup strawberries with sugar-free whipped topping. Carbohydrate calculation First, identify the foods that contain carbohydrate:  Rice.  Corn.  Milk.  Strawberries. Calculate the number of servings eaten:  2 servings rice.  1 serving corn.  1 serving milk.  1 serving strawberries. Multiply the number of servings by 15 g:  2 servings rice x 15 g = 30 g.  1 serving corn x 15 g = 15 g.  1 serving milk x 15 g = 15 g.  1 serving strawberries x 15 g = 15 g. Add the amounts to find the total carbohydrates eaten: 30 g + 15 g + 15 g + 15 g = 75 g carbohydrate eaten at dinner. Document Released: 06/12/2005 Document Revised: 09/04/2011 Document Reviewed: 04/28/2011 [facility] Patient Information 2014 [place], [place].
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
## 2013-07-03 ENCOUNTER — Encounter: Payer: Medicare Other | Admitting: Family Medicine

## 2013-08-01 ENCOUNTER — Telehealth: Payer: Self-pay | Admitting: Family Medicine

## 2013-08-01 MED ORDER — ESCITALOPRAM OXALATE 20 MG PO TABS
20.0000 mg | ORAL_TABLET | Freq: Two times a day (BID) | ORAL | Status: DC
Start: 2013-08-01 — End: 2018-04-30

## 2013-08-01 MED ORDER — ESCITALOPRAM OXALATE 20 MG PO TABS: 20.0000 mg | ORAL_TABLET | Freq: Two times a day (BID) | ORAL | Status: DC

## 2013-08-01 NOTE — Telephone Encounter (Signed)
Patient is on lexapro.  He physchiatrist is monitoring the medicine his name is Dr Ysidro Evert.  He is not returning her call and she is out of meds.  Dr Charlett Blake is who originally put her on this med.  Is there anything we can do to help the patient

## 2013-08-01 NOTE — Telephone Encounter (Signed)
Notified pt. Rx was sent to [facility] in [place] and pt advised to print rx from [email or web address]. Pt called back stating [facility] will not except coupon but [facility] in [place] will. Rx sent to [facility], pt aware.  Rx cancelled at [facility].

## 2013-08-19 ENCOUNTER — Ambulatory Visit: Payer: Medicare Other | Admitting: Family Medicine

## 2013-09-18 ENCOUNTER — Telehealth: Payer: Self-pay | Admitting: Family Medicine

## 2013-09-18 ENCOUNTER — Ambulatory Visit (INDEPENDENT_AMBULATORY_CARE_PROVIDER_SITE_OTHER): Payer: Medicare Other | Admitting: Family Medicine

## 2013-09-18 ENCOUNTER — Encounter: Payer: Self-pay | Admitting: Family Medicine

## 2013-09-18 VITALS — BP 120/76 | HR 74 | Temp 97.7°F | Ht 66.0 in | Wt 224.0 lb

## 2013-09-18 DIAGNOSIS — E785 Hyperlipidemia, unspecified: Secondary | ICD-10-CM

## 2013-09-18 DIAGNOSIS — E669 Obesity, unspecified: Secondary | ICD-10-CM

## 2013-09-18 DIAGNOSIS — F17200 Nicotine dependence, unspecified, uncomplicated: Secondary | ICD-10-CM

## 2013-09-18 DIAGNOSIS — D649 Anemia, unspecified: Secondary | ICD-10-CM

## 2013-09-18 DIAGNOSIS — F319 Bipolar disorder, unspecified: Secondary | ICD-10-CM

## 2013-09-18 DIAGNOSIS — Z720 Tobacco use: Secondary | ICD-10-CM

## 2013-09-18 DIAGNOSIS — Z0000 Encounter for general adult medical examination without abnormal findings: Secondary | ICD-10-CM

## 2013-09-18 LAB — HEPATIC FUNCTION PANEL
ALBUMIN: 4.1 g/dL (ref 3.5–5.2)
ALT: 15 U/L (ref 0–35)
AST: 18 U/L (ref 0–37)
Alkaline Phosphatase: 84 U/L (ref 39–117)
Bilirubin, Direct: 0.1 mg/dL (ref 0.0–0.3)
Indirect Bilirubin: 0.2 mg/dL (ref 0.2–1.2)
TOTAL PROTEIN: 6.5 g/dL (ref 6.0–8.3)
Total Bilirubin: 0.3 mg/dL (ref 0.2–1.2)

## 2013-09-18 LAB — TSH: TSH: 1.435 u[IU]/mL (ref 0.350–4.500)

## 2013-09-18 LAB — LIPID PANEL
Cholesterol: 220 mg/dL — ABNORMAL HIGH (ref 0–200)
HDL: 58 mg/dL (ref >39)
LDL CALC: 143 mg/dL — AB (ref 0–99)
TRIGLYCERIDES: 97 mg/dL (ref <150)
Total CHOL/HDL Ratio: 3.8 Ratio
VLDL: 19 mg/dL (ref 0–40)

## 2013-09-18 LAB — RENAL FUNCTION PANEL
ALBUMIN: 4.1 g/dL (ref 3.5–5.2)
BUN: 7 mg/dL (ref 6–23)
CALCIUM: 8.9 mg/dL (ref 8.4–10.5)
CO2: 24 meq/L (ref 19–32)
CREATININE: 0.77 mg/dL (ref 0.50–1.10)
Chloride: 103 mEq/L (ref 96–112)
GLUCOSE: 92 mg/dL (ref 70–99)
Phosphorus: 2.8 mg/dL (ref 2.3–4.6)
Potassium: 4.3 mEq/L (ref 3.5–5.3)
Sodium: 136 mEq/L (ref 135–145)

## 2013-09-18 LAB — CBC
HCT: 37.5 % (ref 36.0–46.0)
Hemoglobin: 13 g/dL (ref 12.0–15.0)
MCH: 30.2 pg (ref 26.0–34.0)
MCHC: 34.7 g/dL (ref 30.0–36.0)
MCV: 87 fL (ref 78.0–100.0)
PLATELETS: 330 10*3/uL (ref 150–400)
RBC: 4.31 MIL/uL (ref 3.87–5.11)
RDW: 13.6 % (ref 11.5–15.5)
WBC: 9.7 10*3/uL (ref 4.0–10.5)

## 2013-09-18 MED ORDER — PHENTERMINE HCL 37.5 MG PO CAPS: 37.5000 mg | ORAL_CAPSULE | ORAL | Status: DC

## 2013-09-18 NOTE — Patient Instructions (Addendum)
Nicotine Addiction Nicotine can act as both a stimulant (excites/activates) and a sedative (calms/quiets). Immediately after exposure to nicotine, there is a "kick" caused in part by the drug's stimulation of the adrenal glands and resulting discharge of adrenaline (epinephrine). The rush of adrenaline stimulates the body and causes a sudden release of sugar. This means that smokers are always slightly hyperglycemic. Hyperglycemic means that the blood sugar is high, just like in diabetics. Nicotine also decreases the amount of insulin which helps control sugar levels in the body. There is an increase in blood pressure, breathing, and the rate of heart beats.  In addition, nicotine indirectly causes a release of dopamine in the brain that controls pleasure and motivation. A similar reaction is seen with other drugs of abuse, such as cocaine and heroin. This dopamine release is thought to cause the pleasurable sensations when smoking. In some different cases, nicotine can also create a calming effect, depending on sensitivity of the smoker's nervous system and the dose of nicotine taken. WHAT HAPPENS WHEN NICOTINE IS TAKEN FOR LONG PERIODS OF TIME?  Long-term use of nicotine results in addiction. It is difficult to stop.  Repeated use of nicotine creates tolerance. Higher doses of nicotine are needed to get the "kick." When nicotine use is stopped, withdrawal may last a month or more. Withdrawal may begin within a few hours after the last cigarette. Symptoms peak within the first few days and may lessen within a few weeks. For some people, however, symptoms may last for months or longer. Withdrawal symptoms include:   Irritability.  Craving.  Learning and attention deficits.  Sleep disturbances.  Increased appetite. Craving for tobacco may last for 6 months or longer. Many behaviors done while using nicotine can also play a part in the severity of withdrawal symptoms. For some people, the feel,  smell, and sight of a cigarette and the ritual of obtaining, handling, lighting, and smoking the cigarette are closely linked with the pleasure of smoking. When stopped, they also miss the related behaviors which make the withdrawal or craving worse. While nicotine gum and patches may lessen the drug aspects of withdrawal, cravings often persist. WHAT ARE THE MEDICAL CONSEQUENCES OF NICOTINE USE?  Nicotine addiction accounts for one-third of all cancers. The top cancer caused by tobacco is lung cancer. Lung cancer is the number one cancer killer of both men and women.  Smoking is also associated with cancers of the:  Mouth.  Pharynx.  Larynx.  Esophagus.  Stomach.  Pancreas.  Cervix.  Kidney.  Ureter.  Bladder.  Smoking also causes lung diseases such as lasting (chronic) bronchitis and emphysema.  It worsens asthma in adults and children.  Smoking increases the risk of heart disease, including:  Stroke.  Heart attack.  Vascular disease.  Aneurysm.  Passive or secondary smoke can also increase medical risks including:  Asthma in children.  Sudden Infant Death Syndrome (SIDS).  Additionally, dropped cigarettes are the leading cause of residential fire fatalities.  Nicotine poisoning has been reported from accidental ingestion of tobacco products by children and pets. Death usually results in a few minutes from respiratory failure (when a person stops breathing) caused by paralysis. TREATMENT   Medication. Nicotine replacement medicines such as nicotine gum and the patch are used to stop smoking. These medicines gradually lower the dosage of nicotine in the body. These medicines do not contain the carbon monoxide and other toxins found in tobacco smoke.  Hypnotherapy.  Relaxation therapy.  Nicotine Anonymous (a 12-step support   program). Find times and locations in your local yellow pages. Document Released: 02/16/2004 Document Revised: 09/04/2011 Document  Reviewed: 07/10/2007 [employer] Patient Information 2014 [employer].  

## 2013-09-18 NOTE — Progress Notes (Signed)
Pre visit review using our clinic review tool, if applicable. No additional management support is needed unless otherwise documented below in the visit note. 
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
## 2013-09-18 NOTE — Telephone Encounter (Signed)
Relevant patient education assigned to patient using Emmi. ° °

## 2013-09-20 ENCOUNTER — Other Ambulatory Visit: Payer: Self-pay | Admitting: Family Medicine

## 2013-09-21 NOTE — Assessment & Plan Note (Signed)
Encouraged complete cessation. Discussed need to quit as relates to risk of numerous cancers, cardiac and pulmonary disease as well as neurologic complications. Counseled for greater than 3 minutes 

## 2013-09-21 NOTE — Assessment & Plan Note (Signed)
Allowed just 2 months worth of Phetermine. Encouraged DASH diet, decrease po intake and increase exercise as tolerated. Needs 7-8 hours of sleep nightly. Avoid trans fats, eat small, frequent meals every 4-5 hours with lean proteins, complex carbs and healthy fats. Minimize simple carbs, GMO foods.

## 2013-09-21 NOTE — Assessment & Plan Note (Signed)
Encouraged heart healthy diet, increase exercise, avoid trans fats, consider a krill oil cap daily 

## 2013-09-21 NOTE — Assessment & Plan Note (Signed)
Stable on current meds 

## 2013-09-21 NOTE — Assessment & Plan Note (Signed)
resolved 

## 2013-09-21 NOTE — Assessment & Plan Note (Signed)
Patient encouraged to maintain heart healthy diet, regular exercise, adequate sleep. Consider daily probiotics. Take medications as prescribed 

## 2013-09-22 NOTE — Telephone Encounter (Signed)
OK to refill with the same sig, 60 with 1 rf

## 2013-09-22 NOTE — Telephone Encounter (Signed)
Pt has follow up on 10/24/13, last seen 09/18/13.  Rx of tramadol called to pharmacy voicemail as previous refill of 05/19/13, #60 x 1 refill.

## 2013-10-07 ENCOUNTER — Other Ambulatory Visit: Payer: Self-pay | Admitting: Family Medicine

## 2013-10-24 ENCOUNTER — Ambulatory Visit (INDEPENDENT_AMBULATORY_CARE_PROVIDER_SITE_OTHER): Payer: Medicare Other | Admitting: Family Medicine

## 2013-10-24 ENCOUNTER — Encounter: Payer: Self-pay | Admitting: Family Medicine

## 2013-10-24 VITALS — BP 130/90 | HR 83 | Temp 98.0°F | Ht 66.0 in | Wt 216.1 lb

## 2013-10-24 DIAGNOSIS — E669 Obesity, unspecified: Secondary | ICD-10-CM

## 2013-10-24 DIAGNOSIS — J209 Acute bronchitis, unspecified: Secondary | ICD-10-CM

## 2013-10-24 DIAGNOSIS — B009 Herpesviral infection, unspecified: Secondary | ICD-10-CM

## 2013-10-24 DIAGNOSIS — N926 Irregular menstruation, unspecified: Secondary | ICD-10-CM

## 2013-10-24 DIAGNOSIS — B001 Herpesviral vesicular dermatitis: Secondary | ICD-10-CM

## 2013-10-24 DIAGNOSIS — N938 Other specified abnormal uterine and vaginal bleeding: Secondary | ICD-10-CM

## 2013-10-24 DIAGNOSIS — N949 Unspecified condition associated with female genital organs and menstrual cycle: Secondary | ICD-10-CM

## 2013-10-24 DIAGNOSIS — D649 Anemia, unspecified: Secondary | ICD-10-CM

## 2013-10-24 DIAGNOSIS — F319 Bipolar disorder, unspecified: Secondary | ICD-10-CM

## 2013-10-24 DIAGNOSIS — F17200 Nicotine dependence, unspecified, uncomplicated: Secondary | ICD-10-CM

## 2013-10-24 DIAGNOSIS — E785 Hyperlipidemia, unspecified: Secondary | ICD-10-CM

## 2013-10-24 DIAGNOSIS — E663 Overweight: Secondary | ICD-10-CM

## 2013-10-24 DIAGNOSIS — Z720 Tobacco use: Secondary | ICD-10-CM

## 2013-10-24 LAB — POCT URINE HCG BY VISUAL COLOR COMPARISON TESTS: Preg Test, Ur: NEGATIVE

## 2013-10-24 MED ORDER — PHENTERMINE HCL 37.5 MG PO CAPS: 37.5000 mg | ORAL_CAPSULE | ORAL | Status: DC

## 2013-10-24 MED ORDER — ALBUTEROL SULFATE HFA 108 (90 BASE) MCG/ACT IN AERS: 2.0000 | INHALATION_SPRAY | Freq: Four times a day (QID) | RESPIRATORY_TRACT | Status: DC | PRN

## 2013-10-24 MED ORDER — ACYCLOVIR 5 % EX CREA: 1.0000 "application " | TOPICAL_CREAM | CUTANEOUS | Status: DC

## 2013-10-24 MED ORDER — CIPROFLOXACIN HCL 500 MG PO TABS: 500.0000 mg | ORAL_TABLET | Freq: Two times a day (BID) | ORAL | Status: DC

## 2013-10-24 MED ORDER — HYDROCODONE-HOMATROPINE 5-1.5 MG/5ML PO SYRP: 5.0000 mL | ORAL_SOLUTION | Freq: Three times a day (TID) | ORAL | Status: DC | PRN

## 2013-10-24 NOTE — Assessment & Plan Note (Signed)
Stable 1/2 ppd. Encouraged complete cessation. Discussed need to quit as relates to risk of numerous cancers, cardiac and pulmonary disease as well as neurologic complications. Counseled for greater than 3 minutes
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
## 2013-10-24 NOTE — Patient Instructions (Signed)
Start a probiotic daily  Bronchitis Bronchitis is inflammation of the airways that extend from the windpipe into the lungs (bronchi). The inflammation often causes mucus to develop, which leads to a cough. If the inflammation becomes severe, it may cause shortness of breath. CAUSES  Bronchitis may be caused by:   Viral infections.   Bacteria.   Cigarette smoke.   Allergens, pollutants, and other irritants.  SIGNS AND SYMPTOMS  The most common symptom of bronchitis is a frequent cough that produces mucus. Other symptoms include:  Fever.   Body aches.   Chest congestion.   Chills.   Shortness of breath.   Sore throat.  DIAGNOSIS  Bronchitis is usually diagnosed through a medical history and physical exam. Tests, such as chest X-rays, are sometimes done to rule out other conditions.  TREATMENT  You may need to avoid contact with whatever caused the problem (smoking, for example). Medicines are sometimes needed. These may include:  Antibiotics. These may be prescribed if the condition is caused by bacteria.  Cough suppressants. These may be prescribed for relief of cough symptoms.   Inhaled medicines. These may be prescribed to help open your airways and make it easier for you to breathe.   Steroid medicines. These may be prescribed for those with recurrent (chronic) bronchitis. HOME CARE INSTRUCTIONS  Get plenty of rest.   Drink enough fluids to keep your urine clear or pale yellow (unless you have a medical condition that requires fluid restriction). Increasing fluids may help thin your secretions and will prevent dehydration.   Only take over-the-counter or prescription medicines as directed by your health care provider.  Only take antibiotics as directed. Make sure you finish them even if you start to feel better.  Avoid secondhand smoke, irritating chemicals, and strong fumes. These will make bronchitis worse. If you are a smoker, quit smoking.  Consider using nicotine gum or skin patches to help control withdrawal symptoms. Quitting smoking will help your lungs heal faster.   Put a cool-mist humidifier in your bedroom at night to moisten the air. This may help loosen mucus. Change the water in the humidifier daily. You can also run the hot water in your shower and sit in the bathroom with the door closed for 5 10 minutes.   Follow up with your health care provider as directed.   Wash your hands frequently to avoid catching bronchitis again or spreading an infection to others.  SEEK MEDICAL CARE IF: Your symptoms do not improve after 1 week of treatment.  SEEK IMMEDIATE MEDICAL CARE IF:  Your fever increases.  You have chills.   You have chest pain.   You have worsening shortness of breath.   You have bloody sputum.  You faint.  You have lightheadedness.  You have a severe headache.   You vomit repeatedly. MAKE SURE YOU:   Understand these instructions.  Will watch your condition.  Will get help right away if you are not doing well or get worse. Document Released: 06/12/2005 Document Revised: 04/02/2013 Document Reviewed: 02/04/2013 [facility] Patient Information 2014 [place].

## 2013-10-24 NOTE — Assessment & Plan Note (Signed)
resolved 

## 2013-10-24 NOTE — Progress Notes (Signed)
Pre visit review using our clinic review tool, if applicable. No additional management support is needed unless otherwise documented below in the visit note. 

## 2013-10-24 NOTE — Assessment & Plan Note (Signed)
Encouraged DASH diet, decrease po intake and increase exercise as tolerated. Needs 7-8 hours of sleep nightly. Avoid trans fats, eat small, frequent meals every 4-5 hours with lean proteins, complex carbs and healthy fats. Minimize simple carbs, GMO foods. Allowed one more set of refills on the Phentermine but she is aware that is her last refill.

## 2013-10-24 NOTE — Assessment & Plan Note (Signed)
Encouraged heart healthy diet, increase exercise, avoid trans fats, consider a krill oil cap daily 

## 2013-10-24 NOTE — Assessment & Plan Note (Addendum)
Her psychiatrist has recently dropped her Risperdal to 1 mg and she notes increased irritability. They dropped due to progesterone being elevated. May need to switch meds. Did take some Sudafed prior to her angry outburst yesterday she agrees not to take anymore and then contact psych if symptoms do not improve

## 2014-01-26 ENCOUNTER — Ambulatory Visit: Payer: Medicare Other | Admitting: Family Medicine

## 2014-02-10 ENCOUNTER — Encounter: Payer: Self-pay | Admitting: Family Medicine

## 2014-02-10 ENCOUNTER — Ambulatory Visit (INDEPENDENT_AMBULATORY_CARE_PROVIDER_SITE_OTHER): Payer: Medicare Other | Admitting: Family Medicine

## 2014-02-10 ENCOUNTER — Other Ambulatory Visit: Payer: Self-pay | Admitting: Family Medicine

## 2014-02-10 ENCOUNTER — Telehealth: Payer: Self-pay | Admitting: Family Medicine

## 2014-02-10 VITALS — BP 128/90 | HR 88 | Temp 98.1°F | Ht 66.0 in | Wt 213.1 lb

## 2014-02-10 DIAGNOSIS — F17200 Nicotine dependence, unspecified, uncomplicated: Secondary | ICD-10-CM

## 2014-02-10 DIAGNOSIS — N644 Mastodynia: Secondary | ICD-10-CM

## 2014-02-10 DIAGNOSIS — N643 Galactorrhea not associated with childbirth: Secondary | ICD-10-CM

## 2014-02-10 DIAGNOSIS — Z720 Tobacco use: Secondary | ICD-10-CM

## 2014-02-10 DIAGNOSIS — E785 Hyperlipidemia, unspecified: Secondary | ICD-10-CM

## 2014-02-10 DIAGNOSIS — E669 Obesity, unspecified: Secondary | ICD-10-CM

## 2014-02-10 DIAGNOSIS — Z0000 Encounter for general adult medical examination without abnormal findings: Secondary | ICD-10-CM

## 2014-02-10 DIAGNOSIS — F319 Bipolar disorder, unspecified: Secondary | ICD-10-CM

## 2014-02-10 NOTE — Progress Notes (Signed)
Pre visit review using our clinic review tool, if applicable. No additional management support is needed unless otherwise documented below in the visit note. 

## 2014-02-10 NOTE — Telephone Encounter (Signed)
Lab order week of 07-07-2014 lipid renal cbc tsh hepatic

## 2014-02-10 NOTE — Assessment & Plan Note (Signed)
Since increasing Wellbutrin has cut down to less than 1/2 ppd. Encouraged complete cessation. Discussed need to quit as relates to risk of numerous cancers, cardiac and pulmonary disease as well as neurologic complications. Counseled for greater than 3 minutes

## 2014-02-10 NOTE — Patient Instructions (Signed)
Galactorrhea Galactorrhea is when there is a milky nipple discharge. It is different from normal milk in nursing mothers. It usually comes from both nipples. Galactorrhea is not a disease but may be a symptom of a problem. It may continue for years after weaning. Galactorrhea is caused by the hormone prolactin, which stimulates milk production. If the breast discharge looks like pus, is bloody or if there is a lump present in the affected breast, the discharge may be caused by other problems including:  A benign cyst.  Papilloma.  Breast cancer.  A breast infection.  A breast abscess. It can also be seen in men who have a low or absent female hormone (testosterone) level. Galactorrhea can be present in a newborn if the mother had high female hormone (estrogen) levels that crossed into the baby through the placenta. The baby usually has enlarged breasts, but in time, it all goes away on its own. CAUSES   Tumor of the pituitary gland in the brain.  Problems with the hypothalamus in the brain that stimulates the pituitary gland.  Low thyroid function (hypothyroid disease).  Chronic kidney failure.  Medications, antidepressants, tranquilizers and blood pressure medication.  Herbal medications (nettle, fennel, blessed thistle, anise and fenugreek seed).  Illegal drugs (marijuana and opiates).  Breast stimulation during sexual activity or too many and frequent self breast exams.  Birth control pills.  Surgery or trauma to the breast causing nerve damage.  Spinal cord injury. SYMPTOMS   White, yellow or green discharge from one or both breasts.  No menstrual period (amenorrhea) or infrequent menstrual periods (hypomenorrhea).  Hot flashes, lack of sexual desire or vaginal dryness.  Infertility in women and men.  Headaches and vision problems.  Decrease in calcium in your bones (developing osteopenia or osteoporosis). DIAGNOSIS  Your caregiver may be able to know your problem  by taking a detailed history and physical exam of you. Tests that may be done, include:  Blood tests to check for the prolactin hormone, your female and thyroid hormones and a pregnancy test.  A detailed eye exam.  Mammogram.  X-rays, CT scan or MRI of breasts or your brain looking for a tumor. TREATMENT   Stopping medications that may be causing the galactorrhea.  Treating low thyroid function with thyroid hormones.  Medical or surgical (if necessary) treatment of a pituitary gland tumor.  Medication to lower the prolactin hormone level when no cause can be found.  Surgery as a last resort to remove the breasts ducts if the discharge persists with treatment and is a problem.  Treatment may not be necessary if you are not bothered by the breast discharge. HOME CARE INSTRUCTIONS   Before seeing your caregiver, make a list of all your symptoms, medications, when the breast discharge started and questions you may have.  Avoid breast stimulation during sexual activity.  Perform breast self exam once a month.  Avoid clothes that rub on your nipples.  Use breasts pads to absorb the discharge.  Wear a support bra. SEEK MEDICAL CARE IF:   You have galactorrhea and you are trying to get pregnant.  You develop hot flashes, vaginal dryness or lack of sexual desire.  You stop having menstrual periods or they are irregular or far apart.  You have headaches.  You have vision problems. SEEK IMMEDIATE MEDICAL CARE IF:   Your breast discharge is bloody or pus-like.  You have breast pain.  You feel a lump in your breast.  Your breast shows wrinkling or   dimpling.  Your breast becomes red and swollen. Document Released: 07/20/2004 Document Revised: 09/04/2011 Document Reviewed: 06/02/2008 [employer] Patient Information 2015 [employer]. This information is not intended to replace advice given to you by your health care provider. Make sure you discuss any questions you have  with your health care provider.  

## 2014-02-15 ENCOUNTER — Encounter: Payer: Self-pay | Admitting: Family Medicine

## 2014-02-15 DIAGNOSIS — N643 Galactorrhea not associated with childbirth: Secondary | ICD-10-CM

## 2014-02-15 HISTORY — DX: Galactorrhea not associated with childbirth: N64.3

## 2014-02-15 NOTE — Assessment & Plan Note (Signed)
Encouraged heart healthy diet, increase exercise, avoid trans fats, consider a krill oil cap daily 

## 2014-02-15 NOTE — Assessment & Plan Note (Signed)
Encouraged DASH diet, decrease po intake and increase exercise as tolerated. Needs 7-8 hours of sleep nightly. Avoid trans fats, eat small, frequent meals every 4-5 hours with lean proteins, complex carbs and healthy fats. Minimize simple carbs, GMO foods. 

## 2014-02-15 NOTE — Assessment & Plan Note (Signed)
Recently noted on left. Likely related to Risperdal which has been stopped. Will see if resolution occurs.

## 2014-02-15 NOTE — Assessment & Plan Note (Signed)
Her psychiatrist has just stopped her Risperdal due to her persistent feelings of fatigue. Sees [facility], Baker Pierini, [place]
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
## 2014-02-17 ENCOUNTER — Ambulatory Visit
Admission: RE | Admit: 2014-02-17 | Discharge: 2014-02-17 | Disposition: A | Discharge status: Home / Self Care | Payer: Medicare Other | Source: Ambulatory Visit | Attending: Family Medicine | Admitting: Family Medicine

## 2014-02-17 DIAGNOSIS — N644 Mastodynia: Secondary | ICD-10-CM

## 2014-03-18 ENCOUNTER — Other Ambulatory Visit: Payer: Self-pay | Admitting: Family Medicine

## 2014-03-27 ENCOUNTER — Other Ambulatory Visit: Payer: Self-pay | Admitting: Family Medicine

## 2014-03-30 NOTE — Telephone Encounter (Signed)
Last OV 02-10-14  Last RX done on 09-22-13 quantity 60 with 1 refill  RX printed for md to sign and fax

## 2014-06-01 ENCOUNTER — Ambulatory Visit: Payer: Medicare Other | Admitting: Family Medicine

## 2014-06-09 ENCOUNTER — Encounter: Payer: Self-pay | Admitting: Family Medicine

## 2014-06-26 DIAGNOSIS — B019 Varicella without complication: Secondary | ICD-10-CM

## 2014-06-26 HISTORY — DX: Varicella without complication: B01.9

## 2014-07-18 ENCOUNTER — Other Ambulatory Visit: Payer: Self-pay | Admitting: Family Medicine

## 2014-07-23 ENCOUNTER — Encounter: Payer: Medicare Other | Admitting: Family Medicine

## 2014-08-11 ENCOUNTER — Other Ambulatory Visit: Payer: Self-pay | Admitting: Family Medicine

## 2014-08-13 NOTE — Telephone Encounter (Signed)
Patient informed of denail on tramadol refill.. She has scheduled an appointment with PCP 09/29/14

## 2014-09-29 ENCOUNTER — Ambulatory Visit (INDEPENDENT_AMBULATORY_CARE_PROVIDER_SITE_OTHER): Payer: Medicare Other | Admitting: Family Medicine

## 2014-09-29 VITALS — BP 120/76 | HR 91 | Temp 98.0°F | Resp 16 | Ht 67.0 in | Wt 217.0 lb

## 2014-09-29 DIAGNOSIS — M1990 Unspecified osteoarthritis, unspecified site: Secondary | ICD-10-CM

## 2014-09-29 DIAGNOSIS — R2231 Localized swelling, mass and lump, right upper limb: Secondary | ICD-10-CM

## 2014-09-29 DIAGNOSIS — E669 Obesity, unspecified: Secondary | ICD-10-CM

## 2014-09-29 DIAGNOSIS — M79604 Pain in right leg: Secondary | ICD-10-CM

## 2014-09-29 DIAGNOSIS — Z720 Tobacco use: Secondary | ICD-10-CM

## 2014-09-29 MED ORDER — METHYLPREDNISOLONE (PAK) 4 MG PO TABS: ORAL_TABLET | ORAL | Status: DC

## 2014-09-29 MED ORDER — TRAMADOL HCL 50 MG PO TABS: 50.0000 mg | ORAL_TABLET | Freq: Two times a day (BID) | ORAL | Status: DC | PRN

## 2014-09-29 NOTE — Progress Notes (Signed)
Pre visit review using our clinic review tool, if applicable. No additional management support is needed unless otherwise documented below in the visit note. 

## 2014-09-29 NOTE — Patient Instructions (Signed)
Rel of rec Domenica Reamer, MMS, PA-C at [facility] lab work over last yearArthritis, Nonspecific Arthritis is inflammation of a joint. This usually means pain, redness, warmth or swelling are present. One or more joints may be involved. There are a number of types of arthritis. Your caregiver may not be able to tell what type of arthritis you have right away. CAUSES  The most common cause of arthritis is the wear and tear on the joint (osteoarthritis). This causes damage to the cartilage, which can break down over time. The knees, hips, back and neck are most often affected by this type of arthritis. Other types of arthritis and common causes of joint pain include:  Sprains and other injuries near the joint. Sometimes minor sprains and injuries cause pain and swelling that develop hours later.  Rheumatoid arthritis. This affects hands, feet and knees. It usually affects both sides of your body at the same time. It is often associated with chronic ailments, fever, weight loss and general weakness.  Crystal arthritis. Gout and pseudo gout can cause occasional acute severe pain, redness and swelling in the foot, ankle, or knee.  Infectious arthritis. Bacteria can get into a joint through a break in overlying skin. This can cause infection of the joint. Bacteria and viruses can also spread through the blood and affect your joints.  Drug, infectious and allergy reactions. Sometimes joints can become mildly painful and slightly swollen with these types of illnesses. SYMPTOMS   Pain is the main symptom.  Your joint or joints can also be red, swollen and warm or hot to the touch.  You may have a fever with certain types of arthritis, or even feel overall ill.  The joint with arthritis will hurt with movement. Stiffness is present with some types of arthritis. DIAGNOSIS  Your caregiver will suspect arthritis based on your description of your symptoms and on your exam. Testing may be needed to  find the type of arthritis:  Blood and sometimes urine tests.  X-ray tests and sometimes CT or MRI scans.  Removal of fluid from the joint (arthrocentesis) is done to check for bacteria, crystals or other causes. Your caregiver (or a specialist) will numb the area over the joint with a local anesthetic, and use a needle to remove joint fluid for examination. This procedure is only minimally uncomfortable.  Even with these tests, your caregiver may not be able to tell what kind of arthritis you have. Consultation with a specialist (rheumatologist) may be helpful. TREATMENT  Your caregiver will discuss with you treatment specific to your type of arthritis. If the specific type cannot be determined, then the following general recommendations may apply. Treatment of severe joint pain includes:  Rest.  Elevation.  Anti-inflammatory medication (for example, ibuprofen) may be prescribed. Avoiding activities that cause increased pain.  Only take over-the-counter or prescription medicines for pain and discomfort as recommended by your caregiver.  Cold packs over an inflamed joint may be used for 10 to 15 minutes every hour. Hot packs sometimes feel better, but do not use overnight. Do not use hot packs if you are diabetic without your caregiver's permission.  A cortisone shot into arthritic joints may help reduce pain and swelling.  Any acute arthritis that gets worse over the next 1 to 2 days needs to be looked at to be sure there is no joint infection. Long-term arthritis treatment involves modifying activities and lifestyle to reduce joint stress jarring. This can include weight loss. Also, exercise  is needed to nourish the joint cartilage and remove waste. This helps keep the muscles around the joint strong. HOME CARE INSTRUCTIONS   Do not take aspirin to relieve pain if gout is suspected. This elevates uric acid levels.  Only take over-the-counter or prescription medicines for pain,  discomfort or fever as directed by your caregiver.  Rest the joint as much as possible.  If your joint is swollen, keep it elevated.  Use crutches if the painful joint is in your leg.  Drinking plenty of fluids may help for certain types of arthritis.  Follow your caregiver's dietary instructions.  Try low-impact exercise such as:  Swimming.  Water aerobics.  Biking.  Walking.  Morning stiffness is often relieved by a warm shower.  Put your joints through regular range-of-motion. SEEK MEDICAL CARE IF:   You do not feel better in 24 hours or are getting worse.  You have side effects to medications, or are not getting better with treatment. SEEK IMMEDIATE MEDICAL CARE IF:   You have a fever.  You develop severe joint pain, swelling or redness.  Many joints are involved and become painful and swollen.  There is severe back pain and/or leg weakness.  You have loss of bowel or bladder control. Document Released: 07/20/2004 Document Revised: 09/04/2011 Document Reviewed: 08/05/2008 [facility] Patient Information 2015 [place], [place]. This information is not intended to replace advice given to you by your health care provider. Make sure you discuss any questions you have with your health care provider.

## 2014-10-04 ENCOUNTER — Encounter: Payer: Self-pay | Admitting: Family Medicine

## 2014-10-04 DIAGNOSIS — M1990 Unspecified osteoarthritis, unspecified site: Secondary | ICD-10-CM | POA: Insufficient documentation

## 2014-10-04 NOTE — Assessment & Plan Note (Signed)
Continues to struggle with chronic pain but manages with medicatiosn, may continue

## 2014-10-04 NOTE — Assessment & Plan Note (Signed)
Encouraged complete cessation. Discussed need to quit as relates to risk of numerous cancers, cardiac and pulmonary disease as well as neurologic complications. Counseled for greater than 3 minutes 
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
## 2014-10-04 NOTE — Assessment & Plan Note (Signed)
Nodule on right finger, arthritic nodule. Encouraged to keep using pain and try Salon Pas gel.

## 2014-10-04 NOTE — Assessment & Plan Note (Signed)
Encouraged DASH diet, decrease po intake and increase exercise as tolerated. Needs 7-8 hours of sleep nightly. Avoid trans fats, eat small, frequent meals every 4-5 hours with lean proteins, complex carbs and healthy fats. Minimize simple carbs, GMO foods. 

## 2014-10-13 ENCOUNTER — Other Ambulatory Visit (INDEPENDENT_AMBULATORY_CARE_PROVIDER_SITE_OTHER): Payer: Medicare Other

## 2014-10-13 DIAGNOSIS — Z0000 Encounter for general adult medical examination without abnormal findings: Secondary | ICD-10-CM | POA: Diagnosis not present

## 2014-10-13 DIAGNOSIS — R2231 Localized swelling, mass and lump, right upper limb: Secondary | ICD-10-CM | POA: Diagnosis not present

## 2014-10-13 DIAGNOSIS — M1990 Unspecified osteoarthritis, unspecified site: Secondary | ICD-10-CM

## 2014-10-13 DIAGNOSIS — E785 Hyperlipidemia, unspecified: Secondary | ICD-10-CM

## 2014-10-13 LAB — URIC ACID: Uric Acid, Serum: 3.2 mg/dL (ref 2.4–7.0)

## 2014-10-13 LAB — SEDIMENTATION RATE: Sed Rate: 18 mm/hr (ref 0–22)

## 2014-10-13 NOTE — Addendum Note (Signed)
Addended by: Modena Morrow D on: 10/13/2014 10:14 AM   Modules accepted: Orders

## 2014-10-13 NOTE — Addendum Note (Signed)
Addended by: Modena Morrow D on: 10/13/2014 10:11 AM   Modules accepted: Orders

## 2014-10-14 LAB — ANA: Anti Nuclear Antibody(ANA): NEGATIVE

## 2014-11-24 ENCOUNTER — Other Ambulatory Visit: Payer: Self-pay | Admitting: Family Medicine

## 2014-11-24 DIAGNOSIS — R2231 Localized swelling, mass and lump, right upper limb: Secondary | ICD-10-CM

## 2014-11-24 DIAGNOSIS — M1990 Unspecified osteoarthritis, unspecified site: Secondary | ICD-10-CM

## 2014-11-25 ENCOUNTER — Other Ambulatory Visit: Payer: Self-pay

## 2014-11-25 ENCOUNTER — Other Ambulatory Visit: Payer: Self-pay | Admitting: Family Medicine

## 2014-11-25 NOTE — Telephone Encounter (Signed)
OK to refill Tramadol 

## 2014-11-26 ENCOUNTER — Other Ambulatory Visit: Payer: Self-pay | Admitting: Family Medicine

## 2014-11-26 DIAGNOSIS — M1990 Unspecified osteoarthritis, unspecified site: Secondary | ICD-10-CM

## 2014-11-26 DIAGNOSIS — R2231 Localized swelling, mass and lump, right upper limb: Secondary | ICD-10-CM

## 2014-11-26 MED ORDER — TRAMADOL HCL 50 MG PO TABS: 50.0000 mg | ORAL_TABLET | Freq: Two times a day (BID) | ORAL | Status: DC | PRN

## 2014-11-26 NOTE — Telephone Encounter (Signed)
Printed prescription as instructed by PCP and on counter for signature then will fax.  The patient has been notified per [employer] script sent in.

## 2014-11-26 NOTE — Telephone Encounter (Signed)
Printed tramadol per PCP instructions. On counter for signature , then will fax in.  Patient notified per mychart.

## 2015-05-28 ENCOUNTER — Ambulatory Visit (INDEPENDENT_AMBULATORY_CARE_PROVIDER_SITE_OTHER): Payer: Medicare Other | Admitting: Family Medicine

## 2015-05-28 ENCOUNTER — Encounter: Payer: Self-pay | Admitting: Family Medicine

## 2015-05-28 ENCOUNTER — Other Ambulatory Visit (HOSPITAL_COMMUNITY)
Admission: RE | Admit: 2015-05-28 | Discharge: 2015-05-28 | Disposition: A | Discharge status: Home / Self Care | Payer: Medicare Other | Source: Ambulatory Visit | Attending: Family Medicine | Admitting: Family Medicine

## 2015-05-28 VITALS — BP 133/97 | HR 97 | Temp 98.1°F | Ht 67.0 in | Wt 222.5 lb

## 2015-05-28 DIAGNOSIS — Z0000 Encounter for general adult medical examination without abnormal findings: Secondary | ICD-10-CM

## 2015-05-28 DIAGNOSIS — M79604 Pain in right leg: Secondary | ICD-10-CM

## 2015-05-28 DIAGNOSIS — Z124 Encounter for screening for malignant neoplasm of cervix: Secondary | ICD-10-CM | POA: Insufficient documentation

## 2015-05-28 DIAGNOSIS — Z720 Tobacco use: Secondary | ICD-10-CM

## 2015-05-28 DIAGNOSIS — F3110 Bipolar disorder, current episode manic without psychotic features, unspecified: Secondary | ICD-10-CM

## 2015-05-28 DIAGNOSIS — E669 Obesity, unspecified: Secondary | ICD-10-CM | POA: Diagnosis not present

## 2015-05-28 DIAGNOSIS — M1990 Unspecified osteoarthritis, unspecified site: Secondary | ICD-10-CM

## 2015-05-28 DIAGNOSIS — L732 Hidradenitis suppurativa: Secondary | ICD-10-CM

## 2015-05-28 DIAGNOSIS — R2231 Localized swelling, mass and lump, right upper limb: Secondary | ICD-10-CM

## 2015-05-28 HISTORY — DX: Encounter for screening for malignant neoplasm of cervix: Z12.4

## 2015-05-28 MED ORDER — TRAMADOL HCL 50 MG PO TABS: 50.0000 mg | ORAL_TABLET | Freq: Two times a day (BID) | ORAL | Status: DC | PRN

## 2015-05-28 NOTE — Assessment & Plan Note (Signed)
Pap today, no concerns on exam.  

## 2015-05-28 NOTE — Assessment & Plan Note (Signed)
Encouraged DASH diet, decrease po intake and increase exercise as tolerated. Needs 7-8 hours of sleep nightly. Avoid trans fats, eat small, frequent meals every 4-5 hours with lean proteins, complex carbs and healthy fats. Minimize simple carbs 

## 2015-05-28 NOTE — Patient Instructions (Signed)
Preventive Care for Adults, Female A healthy lifestyle and preventive care can promote health and wellness. Preventive health guidelines for women include the following key practices.  A routine yearly physical is a good way to check with your health care provider about your health and preventive screening. It is a chance to share any concerns and updates on your health and to receive a thorough exam.  Visit your dentist for a routine exam and preventive care every 6 months. Brush your teeth twice a day and floss once a day. Good oral hygiene prevents tooth decay and gum disease.  The frequency of eye exams is based on your age, health, family medical history, use of contact lenses, and other factors. Follow your health care provider's recommendations for frequency of eye exams.  Eat a healthy diet. Foods like vegetables, fruits, whole grains, low-fat dairy products, and lean protein foods contain the nutrients you need without too many calories. Decrease your intake of foods high in solid fats, added sugars, and salt. Eat the right amount of calories for you.Get information about a proper diet from your health care provider, if necessary.  Regular physical exercise is one of the most important things you can do for your health. Most adults should get at least 150 minutes of moderate-intensity exercise (any activity that increases your heart rate and causes you to sweat) each week. In addition, most adults need muscle-strengthening exercises on 2 or more days a week.  Maintain a healthy weight. The body mass index (BMI) is a screening tool to identify possible weight problems. It provides an estimate of body fat based on height and weight. Your health care provider can find your BMI and can help you achieve or maintain a healthy weight.For adults 20 years and older:  A BMI below 18.5 is considered underweight.  A BMI of 18.5 to 24.9 is normal.  A BMI of 25 to 29.9 is considered overweight.  A  BMI of 30 and above is considered obese.  Maintain normal blood lipids and cholesterol levels by exercising and minimizing your intake of saturated fat. Eat a balanced diet with plenty of fruit and vegetables. Blood tests for lipids and cholesterol should begin at age 45 and be repeated every 5 years. If your lipid or cholesterol levels are high, you are over 50, or you are at high risk for heart disease, you may need your cholesterol levels checked more frequently.Ongoing high lipid and cholesterol levels should be treated with medicines if diet and exercise are not working.  If you smoke, find out from your health care provider how to quit. If you do not use tobacco, do not start.  Lung cancer screening is recommended for adults aged 45-80 years who are at high risk for developing lung cancer because of a history of smoking. A yearly low-dose CT scan of the lungs is recommended for people who have at least a 30-pack-year history of smoking and are a current smoker or have quit within the past 15 years. A pack year of smoking is smoking an average of 1 pack of cigarettes a day for 1 year (for example: 1 pack a day for 30 years or 2 packs a day for 15 years). Yearly screening should continue until the smoker has stopped smoking for at least 15 years. Yearly screening should be stopped for people who develop a health problem that would prevent them from having lung cancer treatment.  If you are pregnant, do not drink alcohol. If you are  breastfeeding, be very cautious about drinking alcohol. If you are not pregnant and choose to drink alcohol, do not have more than 1 drink per day. One drink is considered to be 12 ounces (355 mL) of beer, 5 ounces (148 mL) of wine, or 1.5 ounces (44 mL) of liquor.  Avoid use of street drugs. Do not share needles with anyone. Ask for help if you need support or instructions about stopping the use of drugs.  High blood pressure causes heart disease and increases the risk  of stroke. Your blood pressure should be checked at least every 1 to 2 years. Ongoing high blood pressure should be treated with medicines if weight loss and exercise do not work.  If you are 55-79 years old, ask your health care provider if you should take aspirin to prevent strokes.  Diabetes screening is done by taking a blood sample to check your blood glucose level after you have not eaten for a certain period of time (fasting). If you are not overweight and you do not have risk factors for diabetes, you should be screened once every 3 years starting at age 45. If you are overweight or obese and you are 40-70 years of age, you should be screened for diabetes every year as part of your cardiovascular risk assessment.  Breast cancer screening is essential preventive care for women. You should practice "breast self-awareness." This means understanding the normal appearance and feel of your breasts and may include breast self-examination. Any changes detected, no matter how small, should be reported to a health care provider. Women in their 20s and 30s should have a clinical breast exam (CBE) by a health care provider as part of a regular health exam every 1 to 3 years. After age 40, women should have a CBE every year. Starting at age 40, women should consider having a mammogram (breast X-ray test) every year. Women who have a family history of breast cancer should talk to their health care provider about genetic screening. Women at a high risk of breast cancer should talk to their health care providers about having an MRI and a mammogram every year.  Breast cancer gene (BRCA)-related cancer risk assessment is recommended for women who have family members with BRCA-related cancers. BRCA-related cancers include breast, ovarian, tubal, and peritoneal cancers. Having family members with these cancers may be associated with an increased risk for harmful changes (mutations) in the breast cancer genes BRCA1 and  BRCA2. Results of the assessment will determine the need for genetic counseling and BRCA1 and BRCA2 testing.  Your health care provider may recommend that you be screened regularly for cancer of the pelvic organs (ovaries, uterus, and vagina). This screening involves a pelvic examination, including checking for microscopic changes to the surface of your cervix (Pap test). You may be encouraged to have this screening done every 3 years, beginning at age 21.  For women ages 30-65, health care providers may recommend pelvic exams and Pap testing every 3 years, or they may recommend the Pap and pelvic exam, combined with testing for human papilloma virus (HPV), every 5 years. Some types of HPV increase your risk of cervical cancer. Testing for HPV may also be done on women of any age with unclear Pap test results.  Other health care providers may not recommend any screening for nonpregnant women who are considered low risk for pelvic cancer and who do not have symptoms. Ask your health care provider if a screening pelvic exam is right for   you.  If you have had past treatment for cervical cancer or a condition that could lead to cancer, you need Pap tests and screening for cancer for at least 20 years after your treatment. If Pap tests have been discontinued, your risk factors (such as having a new sexual partner) need to be reassessed to determine if screening should resume. Some women have medical problems that increase the chance of getting cervical cancer. In these cases, your health care provider may recommend more frequent screening and Pap tests.  Colorectal cancer can be detected and often prevented. Most routine colorectal cancer screening begins at the age of 50 years and continues through age 75 years. However, your health care provider may recommend screening at an earlier age if you have risk factors for colon cancer. On a yearly basis, your health care provider may provide home test kits to check  for hidden blood in the stool. Use of a small camera at the end of a tube, to directly examine the colon (sigmoidoscopy or colonoscopy), can detect the earliest forms of colorectal cancer. Talk to your health care provider about this at age 50, when routine screening begins. Direct exam of the colon should be repeated every 5-10 years through age 75 years, unless early forms of precancerous polyps or small growths are found.  People who are at an increased risk for hepatitis B should be screened for this virus. You are considered at high risk for hepatitis B if:  You were born in a country where hepatitis B occurs often. Talk with your health care provider about which countries are considered high risk.  Your parents were born in a high-risk country and you have not received a shot to protect against hepatitis B (hepatitis B vaccine).  You have HIV or AIDS.  You use needles to inject street drugs.  You live with, or have sex with, someone who has hepatitis B.  You get hemodialysis treatment.  You take certain medicines for conditions like cancer, organ transplantation, and autoimmune conditions.  Hepatitis C blood testing is recommended for all people born from 1945 through 1965 and any individual with known risks for hepatitis C.  Practice safe sex. Use condoms and avoid high-risk sexual practices to reduce the spread of sexually transmitted infections (STIs). STIs include gonorrhea, chlamydia, syphilis, trichomonas, herpes, HPV, and human immunodeficiency virus (HIV). Herpes, HIV, and HPV are viral illnesses that have no cure. They can result in disability, cancer, and death.  You should be screened for sexually transmitted illnesses (STIs) including gonorrhea and chlamydia if:  You are sexually active and are younger than 24 years.  You are older than 24 years and your health care provider tells you that you are at risk for this type of infection.  Your sexual activity has changed  since you were last screened and you are at an increased risk for chlamydia or gonorrhea. Ask your health care provider if you are at risk.  If you are at risk of being infected with HIV, it is recommended that you take a prescription medicine daily to prevent HIV infection. This is called preexposure prophylaxis (PrEP). You are considered at risk if:  You are sexually active and do not regularly use condoms or know the HIV status of your partner(s).  You take drugs by injection.  You are sexually active with a partner who has HIV.  Talk with your health care provider about whether you are at high risk of being infected with HIV. If   you choose to begin PrEP, you should first be tested for HIV. You should then be tested every 3 months for as long as you are taking PrEP.  Osteoporosis is a disease in which the bones lose minerals and strength with aging. This can result in serious bone fractures or breaks. The risk of osteoporosis can be identified using a bone density scan. Women ages 67 years and over and women at risk for fractures or osteoporosis should discuss screening with their health care providers. Ask your health care provider whether you should take a calcium supplement or vitamin D to reduce the rate of osteoporosis.  Menopause can be associated with physical symptoms and risks. Hormone replacement therapy is available to decrease symptoms and risks. You should talk to your health care provider about whether hormone replacement therapy is right for you.  Use sunscreen. Apply sunscreen liberally and repeatedly throughout the day. You should seek shade when your shadow is shorter than you. Protect yourself by wearing long sleeves, pants, a wide-brimmed hat, and sunglasses year round, whenever you are outdoors.  Once a month, do a whole body skin exam, using a mirror to look at the skin on your back. Tell your health care provider of new moles, moles that have irregular borders, moles that  are larger than a pencil eraser, or moles that have changed in shape or color.  Stay current with required vaccines (immunizations).  Influenza vaccine. All adults should be immunized every year.  Tetanus, diphtheria, and acellular pertussis (Td, Tdap) vaccine. Pregnant women should receive 1 dose of Tdap vaccine during each pregnancy. The dose should be obtained regardless of the length of time since the last dose. Immunization is preferred during the 27th-36th week of gestation. An adult who has not previously received Tdap or who does not know her vaccine status should receive 1 dose of Tdap. This initial dose should be followed by tetanus and diphtheria toxoids (Td) booster doses every 10 years. Adults with an unknown or incomplete history of completing a 3-dose immunization series with Td-containing vaccines should begin or complete a primary immunization series including a Tdap dose. Adults should receive a Td booster every 10 years.  Varicella vaccine. An adult without evidence of immunity to varicella should receive 2 doses or a second dose if she has previously received 1 dose. Pregnant females who do not have evidence of immunity should receive the first dose after pregnancy. This first dose should be obtained before leaving the health care facility. The second dose should be obtained 4-8 weeks after the first dose.  Human papillomavirus (HPV) vaccine. Females aged 13-26 years who have not received the vaccine previously should obtain the 3-dose series. The vaccine is not recommended for use in pregnant females. However, pregnancy testing is not needed before receiving a dose. If a female is found to be pregnant after receiving a dose, no treatment is needed. In that case, the remaining doses should be delayed until after the pregnancy. Immunization is recommended for any person with an immunocompromised condition through the age of 61 years if she did not get any or all doses earlier. During the  3-dose series, the second dose should be obtained 4-8 weeks after the first dose. The third dose should be obtained 24 weeks after the first dose and 16 weeks after the second dose.  Zoster vaccine. One dose is recommended for adults aged 30 years or older unless certain conditions are present.  Measles, mumps, and rubella (MMR) vaccine. Adults born  before 1957 generally are considered immune to measles and mumps. Adults born in 1957 or later should have 1 or more doses of MMR vaccine unless there is a contraindication to the vaccine or there is laboratory evidence of immunity to each of the three diseases. A routine second dose of MMR vaccine should be obtained at least 28 days after the first dose for students attending postsecondary schools, health care workers, or international travelers. People who received inactivated measles vaccine or an unknown type of measles vaccine during 1963-1967 should receive 2 doses of MMR vaccine. People who received inactivated mumps vaccine or an unknown type of mumps vaccine before 1979 and are at high risk for mumps infection should consider immunization with 2 doses of MMR vaccine. For females of childbearing age, rubella immunity should be determined. If there is no evidence of immunity, females who are not pregnant should be vaccinated. If there is no evidence of immunity, females who are pregnant should delay immunization until after pregnancy. Unvaccinated health care workers born before 1957 who lack laboratory evidence of measles, mumps, or rubella immunity or laboratory confirmation of disease should consider measles and mumps immunization with 2 doses of MMR vaccine or rubella immunization with 1 dose of MMR vaccine.  Pneumococcal 13-valent conjugate (PCV13) vaccine. When indicated, a person who is uncertain of his immunization history and has no record of immunization should receive the PCV13 vaccine. All adults 65 years of age and older should receive this  vaccine. An adult aged 19 years or older who has certain medical conditions and has not been previously immunized should receive 1 dose of PCV13 vaccine. This PCV13 should be followed with a dose of pneumococcal polysaccharide (PPSV23) vaccine. Adults who are at high risk for pneumococcal disease should obtain the PPSV23 vaccine at least 8 weeks after the dose of PCV13 vaccine. Adults older than 36 years of age who have normal immune system function should obtain the PPSV23 vaccine dose at least 1 year after the dose of PCV13 vaccine.  Pneumococcal polysaccharide (PPSV23) vaccine. When PCV13 is also indicated, PCV13 should be obtained first. All adults aged 65 years and older should be immunized. An adult younger than age 65 years who has certain medical conditions should be immunized. Any person who resides in a nursing home or long-term care facility should be immunized. An adult smoker should be immunized. People with an immunocompromised condition and certain other conditions should receive both PCV13 and PPSV23 vaccines. People with human immunodeficiency virus (HIV) infection should be immunized as soon as possible after diagnosis. Immunization during chemotherapy or radiation therapy should be avoided. Routine use of PPSV23 vaccine is not recommended for American Indians, [place] Natives, or people younger than 65 years unless there are medical conditions that require PPSV23 vaccine. When indicated, people who have unknown immunization and have no record of immunization should receive PPSV23 vaccine. One-time revaccination 5 years after the first dose of PPSV23 is recommended for people aged 19-64 years who have chronic kidney failure, nephrotic syndrome, asplenia, or immunocompromised conditions. People who received 1-2 doses of PPSV23 before age 65 years should receive another dose of PPSV23 vaccine at age 65 years or later if at least 5 years have passed since the previous dose. Doses of PPSV23 are not  needed for people immunized with PPSV23 at or after age 65 years.  Meningococcal vaccine. Adults with asplenia or persistent complement component deficiencies should receive 2 doses of quadrivalent meningococcal conjugate (MenACWY-D) vaccine. The doses should be obtained   at least 2 months apart. Microbiologists working with certain meningococcal bacteria, [place] recruits, people at risk during an outbreak, and people who travel to or live in countries with a high rate of meningitis should be immunized. A first-year college student up through age 34 years who is living in a residence hall should receive a dose if she did not receive a dose on or after her 16th birthday. Adults who have certain high-risk conditions should receive one or more doses of vaccine.  Hepatitis A vaccine. Adults who wish to be protected from this disease, have certain high-risk conditions, work with hepatitis A-infected animals, work in hepatitis A research labs, or travel to or work in countries with a high rate of hepatitis A should be immunized. Adults who were previously unvaccinated and who anticipate close contact with an international adoptee during the first 60 days after arrival in the [place] from a country with a high rate of hepatitis A should be immunized.  Hepatitis B vaccine. Adults who wish to be protected from this disease, have certain high-risk conditions, may be exposed to blood or other infectious body fluids, are household contacts or sex partners of hepatitis B positive people, are clients or workers in certain care facilities, or travel to or work in countries with a high rate of hepatitis B should be immunized.  Haemophilus influenzae type b (Hib) vaccine. A previously unvaccinated person with asplenia or sickle cell disease or having a scheduled splenectomy should receive 1 dose of Hib vaccine. Regardless of previous immunization, a recipient of a hematopoietic stem cell transplant should receive a  3-dose series 6-12 months after her successful transplant. Hib vaccine is not recommended for adults with HIV infection. Preventive Services / Frequency Ages 35 to 4 years  Blood pressure check.** / Every 3-5 years.  Lipid and cholesterol check.** / Every 5 years beginning at age 60.  Clinical breast exam.** / Every 3 years for women in their 71s and 10s.  BRCA-related cancer risk assessment.** / For women who have family members with a BRCA-related cancer (breast, ovarian, tubal, or peritoneal cancers).  Pap test.** / Every 2 years from ages 76 through 26. Every 3 years starting at age 61 through age 76 or 93 with a history of 3 consecutive normal Pap tests.  HPV screening.** / Every 3 years from ages 37 through ages 60 to 51 with a history of 3 consecutive normal Pap tests.  Hepatitis C blood test.** / For any individual with known risks for hepatitis C.  Skin self-exam. / Monthly.  Influenza vaccine. / Every year.  Tetanus, diphtheria, and acellular pertussis (Tdap, Td) vaccine.** / Consult your health care provider. Pregnant women should receive 1 dose of Tdap vaccine during each pregnancy. 1 dose of Td every 10 years.  Varicella vaccine.** / Consult your health care provider. Pregnant females who do not have evidence of immunity should receive the first dose after pregnancy.  HPV vaccine. / 3 doses over 6 months, if 93 and younger. The vaccine is not recommended for use in pregnant females. However, pregnancy testing is not needed before receiving a dose.  Measles, mumps, rubella (MMR) vaccine.** / You need at least 1 dose of MMR if you were born in 1957 or later. You may also need a 2nd dose. For females of childbearing age, rubella immunity should be determined. If there is no evidence of immunity, females who are not pregnant should be vaccinated. If there is no evidence of immunity, females who are  pregnant should delay immunization until after pregnancy.  Pneumococcal  13-valent conjugate (PCV13) vaccine.** / Consult your health care provider.  Pneumococcal polysaccharide (PPSV23) vaccine.** / 1 to 2 doses if you smoke cigarettes or if you have certain conditions.  Meningococcal vaccine.** / 1 dose if you are age 68 to 8 years and a Market researcher living in a residence hall, or have one of several medical conditions, you need to get vaccinated against meningococcal disease. You may also need additional booster doses.  Hepatitis A vaccine.** / Consult your health care provider.  Hepatitis B vaccine.** / Consult your health care provider.  Haemophilus influenzae type b (Hib) vaccine.** / Consult your health care provider. Ages 7 to 53 years  Blood pressure check.** / Every year.  Lipid and cholesterol check.** / Every 5 years beginning at age 25 years.  Lung cancer screening. / Every year if you are aged 11-80 years and have a 30-pack-year history of smoking and currently smoke or have quit within the past 15 years. Yearly screening is stopped once you have quit smoking for at least 15 years or develop a health problem that would prevent you from having lung cancer treatment.  Clinical breast exam.** / Every year after age 48 years.  BRCA-related cancer risk assessment.** / For women who have family members with a BRCA-related cancer (breast, ovarian, tubal, or peritoneal cancers).  Mammogram.** / Every year beginning at age 41 years and continuing for as long as you are in good health. Consult with your health care provider.  Pap test.** / Every 3 years starting at age 65 years through age 37 or 70 years with a history of 3 consecutive normal Pap tests.  HPV screening.** / Every 3 years from ages 72 years through ages 60 to 40 years with a history of 3 consecutive normal Pap tests.  Fecal occult blood test (FOBT) of stool. / Every year beginning at age 21 years and continuing until age 5 years. You may not need to do this test if you get  a colonoscopy every 10 years.  Flexible sigmoidoscopy or colonoscopy.** / Every 5 years for a flexible sigmoidoscopy or every 10 years for a colonoscopy beginning at age 35 years and continuing until age 48 years.  Hepatitis C blood test.** / For all people born from [birth year] through 1965 and any individual with known risks for hepatitis C.  Skin self-exam. / Monthly.  Influenza vaccine. / Every year.  Tetanus, diphtheria, and acellular pertussis (Tdap/Td) vaccine.** / Consult your health care provider. Pregnant women should receive 1 dose of Tdap vaccine during each pregnancy. 1 dose of Td every 10 years.  Varicella vaccine.** / Consult your health care provider. Pregnant females who do not have evidence of immunity should receive the first dose after pregnancy.  Zoster vaccine.** / 1 dose for adults aged 30 years or older.  Measles, mumps, rubella (MMR) vaccine.** / You need at least 1 dose of MMR if you were born in 1957 or later. You may also need a second dose. For females of childbearing age, rubella immunity should be determined. If there is no evidence of immunity, females who are not pregnant should be vaccinated. If there is no evidence of immunity, females who are pregnant should delay immunization until after pregnancy.  Pneumococcal 13-valent conjugate (PCV13) vaccine.** / Consult your health care provider.  Pneumococcal polysaccharide (PPSV23) vaccine.** / 1 to 2 doses if you smoke cigarettes or if you have certain conditions.  Meningococcal vaccine.** /  Consult your health care provider.  Hepatitis A vaccine.** / Consult your health care provider.  Hepatitis B vaccine.** / Consult your health care provider.  Haemophilus influenzae type b (Hib) vaccine.** / Consult your health care provider. Ages 64 years and over  Blood pressure check.** / Every year.  Lipid and cholesterol check.** / Every 5 years beginning at age 23 years.  Lung cancer screening. / Every year if you  are aged 16-80 years and have a 30-pack-year history of smoking and currently smoke or have quit within the past 15 years. Yearly screening is stopped once you have quit smoking for at least 15 years or develop a health problem that would prevent you from having lung cancer treatment.  Clinical breast exam.** / Every year after age 74 years.  BRCA-related cancer risk assessment.** / For women who have family members with a BRCA-related cancer (breast, ovarian, tubal, or peritoneal cancers).  Mammogram.** / Every year beginning at age 44 years and continuing for as long as you are in good health. Consult with your health care provider.  Pap test.** / Every 3 years starting at age 58 years through age 22 or 39 years with 3 consecutive normal Pap tests. Testing can be stopped between 65 and 70 years with 3 consecutive normal Pap tests and no abnormal Pap or HPV tests in the past 10 years.  HPV screening.** / Every 3 years from ages 64 years through ages 70 or 61 years with a history of 3 consecutive normal Pap tests. Testing can be stopped between 65 and 70 years with 3 consecutive normal Pap tests and no abnormal Pap or HPV tests in the past 10 years.  Fecal occult blood test (FOBT) of stool. / Every year beginning at age 40 years and continuing until age 27 years. You may not need to do this test if you get a colonoscopy every 10 years.  Flexible sigmoidoscopy or colonoscopy.** / Every 5 years for a flexible sigmoidoscopy or every 10 years for a colonoscopy beginning at age 7 years and continuing until age 32 years.  Hepatitis C blood test.** / For all people born from [birth year] through 1967 and any individual with known risks for hepatitis C.  Osteoporosis screening.** / A one-time screening for women ages 30 years and over and women at risk for fractures or osteoporosis.  Skin self-exam. / Monthly.  Influenza vaccine. / Every year.  Tetanus, diphtheria, and acellular pertussis (Tdap/Td)  vaccine.** / 1 dose of Td every 10 years.  Varicella vaccine.** / Consult your health care provider.  Zoster vaccine.** / 1 dose for adults aged 35 years or older.  Pneumococcal 13-valent conjugate (PCV13) vaccine.** / Consult your health care provider.  Pneumococcal polysaccharide (PPSV23) vaccine.** / 1 dose for all adults aged 46 years and older.  Meningococcal vaccine.** / Consult your health care provider.  Hepatitis A vaccine.** / Consult your health care provider.  Hepatitis B vaccine.** / Consult your health care provider.  Haemophilus influenzae type b (Hib) vaccine.** / Consult your health care provider. ** Family history and personal history of risk and conditions may change your health care provider's recommendations.   This information is not intended to replace advice given to you by your health care provider. Make sure you discuss any questions you have with your health care provider.   Document Released: 08/08/2001 Document Revised: 07/03/2014 Document Reviewed: 11/07/2010 Elsevier Interactive Patient Education [employer].

## 2015-05-30 ENCOUNTER — Encounter: Payer: Self-pay | Admitting: Family Medicine

## 2015-05-30 DIAGNOSIS — L732 Hidradenitis suppurativa: Secondary | ICD-10-CM | POA: Insufficient documentation

## 2015-05-30 HISTORY — DX: Hidradenitis suppurativa: L73.2

## 2015-05-30 NOTE — Assessment & Plan Note (Signed)
Patient encouraged to maintain heart healthy diet, regular exercise, adequate sleep. Consider daily probiotics. Take medications as prescribed. Labs reviewed. Pap completed.

## 2015-05-30 NOTE — Assessment & Plan Note (Signed)
Encouraged complete cessation. Discussed need to quit as relates to risk of numerous cancers, cardiac and pulmonary disease as well as neurologic complications. Counseled for greater than 3 minutes 

## 2015-05-30 NOTE — Assessment & Plan Note (Signed)
Recurrent b/l she is controlling by cleaning routinely with rubbing alcohol. Continue same

## 2015-05-30 NOTE — Assessment & Plan Note (Signed)
Continues to follow closely with psychiatry and has recently been struggling with a manic episode. Has been placed on Gabapentin and it appears to be helping. At 300 mg po tid the only concern is some fatigue.
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
## 2015-05-30 NOTE — Assessment & Plan Note (Signed)
Struggles with chronic pain, allowed refill on Tramadol which she uses sparingly. Will request copy of drug testing that her psychiatrist does

## 2015-05-31 LAB — CYTOLOGY - PAP

## 2015-07-20 ENCOUNTER — Telehealth: Payer: Self-pay | Admitting: Family Medicine

## 2015-07-20 NOTE — Telephone Encounter (Signed)
LM to schedule AWV with RN (never billed AWV)

## 2015-07-24 ENCOUNTER — Other Ambulatory Visit: Payer: Self-pay | Admitting: Family Medicine

## 2015-07-26 NOTE — Telephone Encounter (Signed)
Faxed hardcopy to [email or web address]

## 2015-09-30 ENCOUNTER — Other Ambulatory Visit: Payer: Self-pay | Admitting: Family Medicine

## 2015-11-26 ENCOUNTER — Ambulatory Visit: Payer: Medicare Other | Admitting: Family Medicine

## 2015-12-24 ENCOUNTER — Other Ambulatory Visit (INDEPENDENT_AMBULATORY_CARE_PROVIDER_SITE_OTHER): Payer: Medicare Other

## 2015-12-24 ENCOUNTER — Ambulatory Visit (INDEPENDENT_AMBULATORY_CARE_PROVIDER_SITE_OTHER): Payer: Medicare Other | Admitting: Family Medicine

## 2015-12-24 ENCOUNTER — Encounter: Payer: Self-pay | Admitting: Family Medicine

## 2015-12-24 ENCOUNTER — Other Ambulatory Visit: Payer: Self-pay | Admitting: Family Medicine

## 2015-12-24 VITALS — BP 108/62 | HR 108 | Temp 98.0°F | Ht 67.0 in | Wt 229.5 lb

## 2015-12-24 DIAGNOSIS — D649 Anemia, unspecified: Secondary | ICD-10-CM

## 2015-12-24 DIAGNOSIS — R739 Hyperglycemia, unspecified: Secondary | ICD-10-CM

## 2015-12-24 DIAGNOSIS — K5900 Constipation, unspecified: Secondary | ICD-10-CM | POA: Diagnosis not present

## 2015-12-24 DIAGNOSIS — E669 Obesity, unspecified: Secondary | ICD-10-CM

## 2015-12-24 DIAGNOSIS — F319 Bipolar disorder, unspecified: Secondary | ICD-10-CM

## 2015-12-24 DIAGNOSIS — E782 Mixed hyperlipidemia: Secondary | ICD-10-CM | POA: Diagnosis not present

## 2015-12-24 DIAGNOSIS — Z720 Tobacco use: Secondary | ICD-10-CM

## 2015-12-24 DIAGNOSIS — J209 Acute bronchitis, unspecified: Secondary | ICD-10-CM

## 2015-12-24 DIAGNOSIS — F3110 Bipolar disorder, current episode manic without psychotic features, unspecified: Secondary | ICD-10-CM

## 2015-12-24 HISTORY — DX: Constipation, unspecified: K59.00

## 2015-12-24 HISTORY — DX: Acute bronchitis, unspecified: J20.9

## 2015-12-24 LAB — COMPREHENSIVE METABOLIC PANEL
ALBUMIN: 4 g/dL (ref 3.5–5.2)
ALT: 11 U/L (ref 0–35)
AST: 14 U/L (ref 0–37)
Alkaline Phosphatase: 94 U/L (ref 39–117)
BILIRUBIN TOTAL: 0.2 mg/dL (ref 0.2–1.2)
BUN: 10 mg/dL (ref 6–23)
CALCIUM: 9 mg/dL (ref 8.4–10.5)
CO2: 27 mEq/L (ref 19–32)
Chloride: 105 mEq/L (ref 96–112)
Creatinine, Ser: 0.81 mg/dL (ref 0.40–1.20)
GFR: 84.45 mL/min (ref >60.00)
Glucose, Bld: 110 mg/dL — ABNORMAL HIGH (ref 70–99)
Potassium: 4.3 mEq/L (ref 3.5–5.1)
Sodium: 136 mEq/L (ref 135–145)
TOTAL PROTEIN: 7 g/dL (ref 6.0–8.3)

## 2015-12-24 LAB — LIPID PANEL
CHOLESTEROL: 224 mg/dL — AB (ref 0–200)
HDL: 57.9 mg/dL (ref >39.00)
LDL Cholesterol: 141 mg/dL — ABNORMAL HIGH (ref 0–99)
NonHDL: 166.59
TRIGLYCERIDES: 128 mg/dL (ref 0.0–149.0)
Total CHOL/HDL Ratio: 4
VLDL: 25.6 mg/dL (ref 0.0–40.0)

## 2015-12-24 LAB — CBC
HCT: 33.4 % — ABNORMAL LOW (ref 36.0–46.0)
HEMOGLOBIN: 11 g/dL — AB (ref 12.0–15.0)
MCHC: 32.8 g/dL (ref 30.0–36.0)
MCV: 83.7 fl (ref 78.0–100.0)
PLATELETS: 337 10*3/uL (ref 150.0–400.0)
RBC: 3.99 Mil/uL (ref 3.87–5.11)
RDW: 14.5 % (ref 11.5–15.5)
WBC: 8.4 10*3/uL (ref 4.0–10.5)

## 2015-12-24 LAB — HEMOGLOBIN A1C: HEMOGLOBIN A1C: 5.9 % (ref 4.6–6.5)

## 2015-12-24 LAB — TSH: TSH: 2.72 u[IU]/mL (ref 0.35–4.50)

## 2015-12-24 MED ORDER — PREDNISONE 20 MG PO TABS: 20.0000 mg | ORAL_TABLET | Freq: Every day | ORAL | Status: DC

## 2015-12-24 MED ORDER — TRAMADOL HCL 50 MG PO TABS: 50.0000 mg | ORAL_TABLET | Freq: Two times a day (BID) | ORAL | Status: DC | PRN

## 2015-12-24 MED ORDER — FERROUS FUMARATE 324 (106 FE) MG PO TABS: 1.0000 | ORAL_TABLET | Freq: Every day | ORAL | Status: DC

## 2015-12-24 MED ORDER — CEFDINIR 300 MG PO CAPS: 300.0000 mg | ORAL_CAPSULE | Freq: Two times a day (BID) | ORAL | Status: AC
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
## 2015-12-24 NOTE — Assessment & Plan Note (Addendum)
Encouraged increased rest and hydration, add probiotics, zinc such as Coldeze or Xicam. Treat fevers as needed. Cefdinir and Prednisone

## 2015-12-24 NOTE — Progress Notes (Signed)
Pre visit review using our clinic review tool, if applicable. No additional management support is needed unless otherwise documented below in the visit note. 

## 2015-12-24 NOTE — Assessment & Plan Note (Signed)
Encouraged increased hydration and fiber in diet. Daily probiotics. If bowels not moving can use MOM 2 tbls po in 4 oz of warm prune juice by mouth every 2-3 days. If no results then repeat in 4 hours with  Dulcolax suppository pr, may repeat again in 4 more hours as needed. Seek care if symptoms worsen. Consider daily Miralax and/or Dulcolax if symptoms persist.  

## 2015-12-24 NOTE — Assessment & Plan Note (Signed)
Encouraged heart healthy diet, increase exercise, avoid trans fats, consider a krill oil cap daily 

## 2015-12-24 NOTE — Assessment & Plan Note (Signed)
Encouraged DASH diet, decrease po intake and increase exercise as tolerated. Needs 7-8 hours of sleep nightly. Avoid trans fats, eat small, frequent meals every 4-5 hours with lean proteins, complex carbs and healthy fats. Minimize simple carbs 

## 2015-12-24 NOTE — Patient Instructions (Signed)
NOW probiotic daily Encouraged increased hydration and fiber in diet. Daily probiotics. If bowels not moving can use MOM 2 tbls po in 4 oz of warm prune juice by mouth every 2-3 days. If no results then repeat in 4 hours with  Dulcolax suppository pr, may repeat again in 4 more hours as needed. Seek care if symptoms worsen. Consider daily Miralax and/or Dulcolax if symptoms persist. 64 oz   Acute Bronchitis Bronchitis is inflammation of the airways that extend from the windpipe into the lungs (bronchi). The inflammation often causes mucus to develop. This leads to a cough, which is the most common symptom of bronchitis.  In acute bronchitis, the condition usually develops suddenly and goes away over time, usually in a couple weeks. Smoking, allergies, and asthma can make bronchitis worse. Repeated episodes of bronchitis may cause further lung problems.  CAUSES Acute bronchitis is most often caused by the same virus that causes a cold. The virus can spread from person to person (contagious) through coughing, sneezing, and touching contaminated objects. SIGNS AND SYMPTOMS   Cough.   Fever.   Coughing up mucus.   Body aches.   Chest congestion.   Chills.   Shortness of breath.   Sore throat.  DIAGNOSIS  Acute bronchitis is usually diagnosed through a physical exam. Your health care provider will also ask you questions about your medical history. Tests, such as chest X-rays, are sometimes done to rule out other conditions.  TREATMENT  Acute bronchitis usually goes away in a couple weeks. Oftentimes, no medical treatment is necessary. Medicines are sometimes given for relief of fever or cough. Antibiotic medicines are usually not needed but may be prescribed in certain situations. In some cases, an inhaler may be recommended to help reduce shortness of breath and control the cough. A cool mist vaporizer may also be used to help thin bronchial secretions and make it easier to clear the  chest.  HOME CARE INSTRUCTIONS  Get plenty of rest.   Drink enough fluids to keep your urine clear or pale yellow (unless you have a medical condition that requires fluid restriction). Increasing fluids may help thin your respiratory secretions (sputum) and reduce chest congestion, and it will prevent dehydration.   Take medicines only as directed by your health care provider.  If you were prescribed an antibiotic medicine, finish it all even if you start to feel better.  Avoid smoking and secondhand smoke. Exposure to cigarette smoke or irritating chemicals will make bronchitis worse. If you are a smoker, consider using nicotine gum or skin patches to help control withdrawal symptoms. Quitting smoking will help your lungs heal faster.   Reduce the chances of another bout of acute bronchitis by washing your hands frequently, avoiding people with cold symptoms, and trying not to touch your hands to your mouth, nose, or eyes.   Keep all follow-up visits as directed by your health care provider.  SEEK MEDICAL CARE IF: Your symptoms do not improve after 1 week of treatment.  SEEK IMMEDIATE MEDICAL CARE IF:  You develop an increased fever or chills.   You have chest pain.   You have severe shortness of breath.  You have bloody sputum.   You develop dehydration.  You faint or repeatedly feel like you are going to pass out.  You develop repeated vomiting.  You develop a severe headache. MAKE SURE YOU:   Understand these instructions.  Will watch your condition.  Will get help right away if you are not  doing well or get worse.   This information is not intended to replace advice given to you by your health care provider. Make sure you discuss any questions you have with your health care provider.   Document Released: 07/20/2004 Document Revised: 07/03/2014 Document Reviewed: 12/03/2012 Elsevier Interactive Patient Education [employer].

## 2015-12-24 NOTE — Assessment & Plan Note (Signed)
Encouraged complete cessation. Discussed need to quit as relates to risk of numerous cancers, cardiac and pulmonary disease as well as neurologic complications. Counseled for greater than 3 minutes, unfortunately up to 2 ppd

## 2015-12-29 NOTE — Assessment & Plan Note (Signed)
Following with psychiatry, no recent change in meds.

## 2016-02-26 ENCOUNTER — Other Ambulatory Visit: Payer: Self-pay | Admitting: Family Medicine

## 2016-02-29 ENCOUNTER — Other Ambulatory Visit: Payer: Self-pay | Admitting: Family Medicine

## 2016-02-29 MED ORDER — FERROUS FUMARATE 324 (106 FE) MG PO TABS: 1.0000 | ORAL_TABLET | Freq: Every day | ORAL | 0 refills | Status: DC

## 2016-03-27 ENCOUNTER — Encounter: Payer: Self-pay | Admitting: Family Medicine

## 2016-03-27 ENCOUNTER — Other Ambulatory Visit: Payer: Self-pay | Admitting: Family Medicine

## 2016-03-27 ENCOUNTER — Ambulatory Visit (HOSPITAL_BASED_OUTPATIENT_CLINIC_OR_DEPARTMENT_OTHER)
Admission: RE | Admit: 2016-03-27 | Discharge: 2016-03-27 | Disposition: A | Discharge status: Home / Self Care | Payer: Medicare Other | Source: Ambulatory Visit | Attending: Family Medicine | Admitting: Family Medicine

## 2016-03-27 ENCOUNTER — Ambulatory Visit (INDEPENDENT_AMBULATORY_CARE_PROVIDER_SITE_OTHER): Payer: Medicare Other | Admitting: Family Medicine

## 2016-03-27 ENCOUNTER — Telehealth: Payer: Self-pay | Admitting: Family Medicine

## 2016-03-27 VITALS — BP 118/80 | HR 91 | Temp 98.4°F | Wt 228.6 lb

## 2016-03-27 DIAGNOSIS — R1011 Right upper quadrant pain: Secondary | ICD-10-CM | POA: Diagnosis not present

## 2016-03-27 DIAGNOSIS — L732 Hidradenitis suppurativa: Secondary | ICD-10-CM | POA: Diagnosis not present

## 2016-03-27 DIAGNOSIS — R739 Hyperglycemia, unspecified: Secondary | ICD-10-CM | POA: Diagnosis not present

## 2016-03-27 DIAGNOSIS — K6289 Other specified diseases of anus and rectum: Secondary | ICD-10-CM | POA: Insufficient documentation

## 2016-03-27 DIAGNOSIS — N200 Calculus of kidney: Secondary | ICD-10-CM | POA: Insufficient documentation

## 2016-03-27 DIAGNOSIS — D649 Anemia, unspecified: Secondary | ICD-10-CM | POA: Diagnosis not present

## 2016-03-27 DIAGNOSIS — K5900 Constipation, unspecified: Secondary | ICD-10-CM | POA: Diagnosis not present

## 2016-03-27 DIAGNOSIS — E782 Mixed hyperlipidemia: Secondary | ICD-10-CM

## 2016-03-27 DIAGNOSIS — Z0000 Encounter for general adult medical examination without abnormal findings: Secondary | ICD-10-CM

## 2016-03-27 DIAGNOSIS — R109 Unspecified abdominal pain: Secondary | ICD-10-CM | POA: Diagnosis not present

## 2016-03-27 HISTORY — DX: Right upper quadrant pain: R10.11

## 2016-03-27 LAB — COMPREHENSIVE METABOLIC PANEL
ALK PHOS: 87 U/L (ref 39–117)
ALT: 16 U/L (ref 0–35)
AST: 17 U/L (ref 0–37)
Albumin: 4 g/dL (ref 3.5–5.2)
BUN: 10 mg/dL (ref 6–23)
CO2: 25 mEq/L (ref 19–32)
Calcium: 8.9 mg/dL (ref 8.4–10.5)
Chloride: 106 mEq/L (ref 96–112)
Creatinine, Ser: 0.76 mg/dL (ref 0.40–1.20)
GFR: 90.76 mL/min (ref >60.00)
GLUCOSE: 101 mg/dL — AB (ref 70–99)
POTASSIUM: 4.1 meq/L (ref 3.5–5.1)
SODIUM: 138 meq/L (ref 135–145)
TOTAL PROTEIN: 7 g/dL (ref 6.0–8.3)
Total Bilirubin: 0.2 mg/dL (ref 0.2–1.2)

## 2016-03-27 LAB — LIPID PANEL
CHOL/HDL RATIO: 5
Cholesterol: 225 mg/dL — ABNORMAL HIGH (ref 0–200)
HDL: 46 mg/dL (ref >39.00)
LDL Cholesterol: 155 mg/dL — ABNORMAL HIGH (ref 0–99)
NONHDL: 179.02
Triglycerides: 121 mg/dL (ref 0.0–149.0)
VLDL: 24.2 mg/dL (ref 0.0–40.0)

## 2016-03-27 LAB — CBC
HEMATOCRIT: 37.1 % (ref 36.0–46.0)
HEMOGLOBIN: 12.9 g/dL (ref 12.0–15.0)
MCHC: 34.8 g/dL (ref 30.0–36.0)
MCV: 89.2 fl (ref 78.0–100.0)
Platelets: 287 10*3/uL (ref 150.0–400.0)
RBC: 4.16 Mil/uL (ref 3.87–5.11)
RDW: 14.2 % (ref 11.5–15.5)
WBC: 12.3 10*3/uL — AB (ref 4.0–10.5)

## 2016-03-27 LAB — URINALYSIS
Bilirubin Urine: NEGATIVE
Hgb urine dipstick: NEGATIVE
Ketones, ur: NEGATIVE
Leukocytes, UA: NEGATIVE
NITRITE: NEGATIVE
PH: 6 (ref 5.0–8.0)
TOTAL PROTEIN, URINE-UPE24: NEGATIVE
Urine Glucose: NEGATIVE
Urobilinogen, UA: 0.2 (ref 0.0–1.0)

## 2016-03-27 LAB — HEMOGLOBIN A1C: HEMOGLOBIN A1C: 5.6 % (ref 4.6–6.5)

## 2016-03-27 LAB — TSH: TSH: 3.19 u[IU]/mL (ref 0.35–4.50)

## 2016-03-27 MED ORDER — TIZANIDINE HCL 4 MG PO TABS: 4.0000 mg | ORAL_TABLET | Freq: Four times a day (QID) | ORAL | 1 refills | Status: DC | PRN

## 2016-03-27 MED ORDER — SULFAMETHOXAZOLE-TRIMETHOPRIM 800-160 MG PO TABS: 1.0000 | ORAL_TABLET | Freq: Two times a day (BID) | ORAL | 0 refills | Status: DC

## 2016-03-27 MED ORDER — FLUCONAZOLE 150 MG PO TABS: 150.0000 mg | ORAL_TABLET | ORAL | 0 refills | Status: DC

## 2016-03-27 NOTE — Patient Instructions (Signed)
Encouraged increased hydration and fiber in diet. Daily probiotics. If bowels not moving can use MOM 2 tbls po in 4 oz of warm prune juice by mouth every 2-3 days. If no results then repeat in 4 hours with  Dulcolax suppository pr, may repeat again in 4 more hours as needed. Seek care if symptoms worsen. Consider daily Miralax and/or Dulcolax if symptoms persist.   Apply moist heat and Lidocaine patches, Aspercreme, Icy Hot or Salon Pas  Back Pain, Adult Back pain is very common in adults.The cause of back pain is rarely dangerous and the pain often gets better over time.The cause of your back pain may not be known. Some common causes of back pain include:  Strain of the muscles or ligaments supporting the spine.  Wear and tear (degeneration) of the spinal disks.  Arthritis.  Direct injury to the back. For many people, back pain may return. Since back pain is rarely dangerous, most people can learn to manage this condition on their own. HOME CARE INSTRUCTIONS Watch your back pain for any changes. The following actions may help to lessen any discomfort you are feeling:  Remain active. It is stressful on your back to sit or stand in one place for long periods of time. Do not sit, drive, or stand in one place for more than 30 minutes at a time. Take short walks on even surfaces as soon as you are able.Try to increase the length of time you walk each day.  Exercise regularly as directed by your health care provider. Exercise helps your back heal faster. It also helps avoid future injury by keeping your muscles strong and flexible.  Do not stay in bed.Resting more than 1-2 days can delay your recovery.  Pay attention to your body when you bend and lift. The most comfortable positions are those that put less stress on your recovering back. Always use proper lifting techniques, including:  Bending your knees.  Keeping the load close to your body.  Avoiding twisting.  Find a comfortable  position to sleep. Use a firm mattress and lie on your side with your knees slightly bent. If you lie on your back, put a pillow under your knees.  Avoid feeling anxious or stressed.Stress increases muscle tension and can worsen back pain.It is important to recognize when you are anxious or stressed and learn ways to manage it, such as with exercise.  Take medicines only as directed by your health care provider. Over-the-counter medicines to reduce pain and inflammation are often the most helpful.Your health care provider may prescribe muscle relaxant drugs.These medicines help dull your pain so you can more quickly return to your normal activities and healthy exercise.  Apply ice to the injured area:  Put ice in a plastic bag.  Place a towel between your skin and the bag.  Leave the ice on for 20 minutes, 2-3 times a day for the first 2-3 days. After that, ice and heat may be alternated to reduce pain and spasms.  Maintain a healthy weight. Excess weight puts extra stress on your back and makes it difficult to maintain good posture. SEEK MEDICAL CARE IF:  You have pain that is not relieved with rest or medicine.  You have increasing pain going down into the legs or buttocks.  You have pain that does not improve in one week.  You have night pain.  You lose weight.  You have a fever or chills. SEEK IMMEDIATE MEDICAL CARE IF:   You develop  new bowel or bladder control problems.  You have unusual weakness or numbness in your arms or legs.  You develop nausea or vomiting.  You develop abdominal pain.  You feel faint.   This information is not intended to replace advice given to you by your health care provider. Make sure you discuss any questions you have with your health care provider.   Document Released: 06/12/2005 Document Revised: 07/03/2014 Document Reviewed: 10/14/2013 Elsevier Interactive Patient Education [employer].

## 2016-03-27 NOTE — Telephone Encounter (Signed)
It is not a prescription it is over the counter. Also her WBC was up so I ordered an abdominal ultrasound please make sure she has that in process.

## 2016-03-27 NOTE — Assessment & Plan Note (Signed)
Moist hot compresses, clean with alcohol and started on Bactrim DS and consider referral to surgeon if recurs again, diflucan for possible yeast infection. Happens mostly with cycles

## 2016-03-27 NOTE — Telephone Encounter (Signed)
Pt says that provider discussed starting her on Lateran. (not sure of spelling) She says that she went to pharmacy and was advised that provider didn't sent in Rx. She would like to be advised.    CB: [phone]
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
## 2016-03-27 NOTE — Assessment & Plan Note (Signed)
hgba1c acceptable, minimize simple carbs. Increase exercise as tolerated.  

## 2016-03-27 NOTE — Progress Notes (Signed)
Pre visit review using our clinic review tool, if applicable. No additional management support is needed unless otherwise documented below in the visit note. 

## 2016-03-27 NOTE — Telephone Encounter (Signed)
Relation to [id number] Call back number:[phone] Pharmacy: [id number] - [place], [place]  Reason for call:  Patient checking on the status of lidocaine prescription. Please advise

## 2016-03-27 NOTE — Assessment & Plan Note (Signed)
Increase leafy greens, consider increased lean red meat and using cast iron cookware. Continue to monitor, report any concerns 

## 2016-03-27 NOTE — Assessment & Plan Note (Signed)
She linked it to exercise and straining but with leukocytosis need to rule out leukocytosis so will proceed with abdominal ultrasound

## 2016-03-27 NOTE — Assessment & Plan Note (Signed)
Encouraged heart healthy diet, increase exercise, avoid trans fats, consider a krill oil cap daily 

## 2016-03-28 ENCOUNTER — Telehealth: Payer: Self-pay | Admitting: Family Medicine

## 2016-03-28 ENCOUNTER — Telehealth: Payer: Self-pay | Admitting: *Deleted

## 2016-03-28 LAB — URINE CULTURE: ORGANISM ID, BACTERIA: NO GROWTH

## 2016-03-28 MED ORDER — HYDROCORTISONE ACETATE 25 MG RE SUPP: 25.0000 mg | Freq: Two times a day (BID) | RECTAL | 0 refills | Status: DC

## 2016-03-28 NOTE — Telephone Encounter (Signed)
[facility] note received via fax  Call:   Date: 03/27/16 Time: 2109   Caller: Self Return number: [phone]  Nurse: Ocie Cornfield, RN  Chief Complaint: Medication Question (non-symptomatic)  Reason for call: Saw Dr. Charlett Blake this morning and thought it was a pulled muscle, then the dr told her to go get an ultra sound because of blood work results and it may be her gal bladder. Does she take her meds for the muscle or not.  Related visit to physician within the last 2 weeks: Yes  Guideline: N/A  Disposition: Clinical Call  **Discussed w/ Dr. Charlett Blake, okay to take Zanaflex as directed.**

## 2016-03-28 NOTE — Telephone Encounter (Signed)
OK to use hemorrhoid suppository, Anusol HC supp 1 pr qhs prn disp #10

## 2016-03-28 NOTE — Telephone Encounter (Signed)
Pharmacy is calling to let us know that this medication hydrocortisone (ANUSOL-HC) 25 MG suppository is not covered by the patient's insurance and she knows it is normally an expensive medication. The pharmacist is wondering if we could prescribe her something else.    Pharmacy Contact: [phone]

## 2016-03-28 NOTE — Addendum Note (Signed)
Addended by: Sharon Seller B on: 03/28/2016 01:55 PM   Modules accepted: Orders

## 2016-03-28 NOTE — Telephone Encounter (Signed)
Sent in suppositories to her pharmacy. Patient informed of PCP instructions

## 2016-03-28 NOTE — Telephone Encounter (Signed)
Patient stated she has not had a normal BM in a couple days and now hemorrhoids are bleeding.  She needs to use a hemorrhoid suppository, but not sure if ok since not having a BM

## 2016-03-28 NOTE — Telephone Encounter (Signed)
Need to know what her insurance will cover for hemorrhoids.

## 2016-03-29 ENCOUNTER — Telehealth: Payer: Self-pay | Admitting: *Deleted

## 2016-03-29 NOTE — Telephone Encounter (Signed)
CallerBelenda Cruise from [facility] #: [phone]  Reason for call: Diflucan rx sent on 03/27/16. Concurrent use of diflucan and alprazolam can cause prolonged QT interval. Please advise.

## 2016-03-29 NOTE — Telephone Encounter (Signed)
Patient can hold the Xanax for a couple of days around use of the Diflucan. Then resume.

## 2016-03-29 NOTE — Telephone Encounter (Signed)
Called pharmacy, insurance company did not send formulary or recommend alternative medication. Called pt and notified her to call insurance company and ask for covered alternatives and let us know what the options are. She verbalized understanding of instructions.

## 2016-03-29 NOTE — Telephone Encounter (Signed)
Pharmacy notified of instructions and verbalized understanding. 

## 2016-03-31 ENCOUNTER — Telehealth: Payer: Self-pay | Admitting: Family Medicine

## 2016-03-31 MED ORDER — HYDROCORTISONE 2.5 % RE CREA: 1.0000 "application " | TOPICAL_CREAM | RECTAL | 1 refills | Status: DC | PRN

## 2016-03-31 NOTE — Telephone Encounter (Signed)
Caller name: Relationship to patient: Pharmacy  Pharmacy:  [id number] - [place], [place] [phone] (Phone) [phone] (Fax)     Reason for call: Insurance does not cover hydrocortisone (ANUSOL-HC) 25 MG suppository [id number]  And wants to know if it can be changed to a cream.

## 2016-03-31 NOTE — Telephone Encounter (Signed)
Pharmacist states they will cover Proctisol HC 2.5% rectal cream. Please send in

## 2016-03-31 NOTE — Telephone Encounter (Signed)
Check with pharmacy and see if her insurance covers any suppositories or creams and if so which ones so we can choose one.

## 2016-03-31 NOTE — Telephone Encounter (Signed)
OK to send in rx for Proctisol 1 dose pr qhs prn disp 1 tube with 1 rf

## 2016-04-03 MED ORDER — HYDROCORTISONE 2.5 % RE CREA: 1.0000 "application " | TOPICAL_CREAM | Freq: Two times a day (BID) | RECTAL | 1 refills | Status: DC

## 2016-04-03 NOTE — Addendum Note (Signed)
Addended by: Sharon Seller B on: 04/03/2016 07:39 AM   Modules accepted: Orders

## 2016-04-26 ENCOUNTER — Telehealth: Payer: Self-pay | Admitting: Family Medicine

## 2016-04-26 NOTE — Telephone Encounter (Signed)
Caller name: Relationship to patient: Dr. Ysidro Evert Office Can be reached: [phone]     Reason for call: Dr Eino Farber office needs copies of last labs for this patient faxed to them

## 2016-04-27 NOTE — Telephone Encounter (Signed)
Labs faxed as requested

## 2016-06-22 ENCOUNTER — Other Ambulatory Visit: Payer: Self-pay | Admitting: Family Medicine

## 2016-06-22 DIAGNOSIS — D649 Anemia, unspecified: Secondary | ICD-10-CM

## 2016-06-22 DIAGNOSIS — E782 Mixed hyperlipidemia: Secondary | ICD-10-CM

## 2016-06-22 DIAGNOSIS — K5900 Constipation, unspecified: Secondary | ICD-10-CM

## 2016-06-22 DIAGNOSIS — J209 Acute bronchitis, unspecified: Secondary | ICD-10-CM

## 2016-06-22 DIAGNOSIS — Z720 Tobacco use: Secondary | ICD-10-CM

## 2016-06-22 NOTE — Telephone Encounter (Signed)
Faxed hardcopy to [facility]

## 2016-06-22 NOTE — Telephone Encounter (Signed)
Last refill #60 with 0 refills on 12/24/2015 Last office visit 03/27/2016
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
## 2016-06-28 NOTE — Progress Notes (Signed)
Pre visit review using our clinic review tool, if applicable. No additional management support is needed unless otherwise documented below in the visit note. 

## 2016-06-29 ENCOUNTER — Ambulatory Visit (HOSPITAL_BASED_OUTPATIENT_CLINIC_OR_DEPARTMENT_OTHER)
Admission: RE | Admit: 2016-06-29 | Discharge: 2016-06-29 | Disposition: A | Discharge status: Home / Self Care | Payer: Medicare Other | Source: Ambulatory Visit | Attending: Family Medicine | Admitting: Family Medicine

## 2016-06-29 ENCOUNTER — Encounter: Payer: Self-pay | Admitting: Family Medicine

## 2016-06-29 ENCOUNTER — Telehealth: Payer: Self-pay | Admitting: Family Medicine

## 2016-06-29 ENCOUNTER — Encounter (HOSPITAL_BASED_OUTPATIENT_CLINIC_OR_DEPARTMENT_OTHER): Payer: Self-pay

## 2016-06-29 ENCOUNTER — Ambulatory Visit (INDEPENDENT_AMBULATORY_CARE_PROVIDER_SITE_OTHER): Payer: Medicare Other | Admitting: Family Medicine

## 2016-06-29 VITALS — BP 114/81 | HR 99 | Ht 67.0 in | Wt 231.0 lb

## 2016-06-29 DIAGNOSIS — E782 Mixed hyperlipidemia: Secondary | ICD-10-CM | POA: Diagnosis not present

## 2016-06-29 DIAGNOSIS — K5900 Constipation, unspecified: Secondary | ICD-10-CM

## 2016-06-29 DIAGNOSIS — E6609 Other obesity due to excess calories: Secondary | ICD-10-CM

## 2016-06-29 DIAGNOSIS — Z0000 Encounter for general adult medical examination without abnormal findings: Secondary | ICD-10-CM

## 2016-06-29 DIAGNOSIS — R739 Hyperglycemia, unspecified: Secondary | ICD-10-CM | POA: Diagnosis not present

## 2016-06-29 DIAGNOSIS — Z720 Tobacco use: Secondary | ICD-10-CM | POA: Diagnosis not present

## 2016-06-29 DIAGNOSIS — R1032 Left lower quadrant pain: Secondary | ICD-10-CM

## 2016-06-29 DIAGNOSIS — R05 Cough: Secondary | ICD-10-CM | POA: Insufficient documentation

## 2016-06-29 DIAGNOSIS — Z202 Contact with and (suspected) exposure to infections with a predominantly sexual mode of transmission: Secondary | ICD-10-CM

## 2016-06-29 DIAGNOSIS — R059 Cough, unspecified: Secondary | ICD-10-CM

## 2016-06-29 DIAGNOSIS — Z114 Encounter for screening for human immunodeficiency virus [HIV]: Secondary | ICD-10-CM

## 2016-06-29 DIAGNOSIS — R1010 Upper abdominal pain, unspecified: Secondary | ICD-10-CM

## 2016-06-29 DIAGNOSIS — R195 Other fecal abnormalities: Secondary | ICD-10-CM | POA: Diagnosis not present

## 2016-06-29 DIAGNOSIS — Z7251 High risk heterosexual behavior: Secondary | ICD-10-CM

## 2016-06-29 DIAGNOSIS — D649 Anemia, unspecified: Secondary | ICD-10-CM | POA: Diagnosis not present

## 2016-06-29 DIAGNOSIS — D72829 Elevated white blood cell count, unspecified: Secondary | ICD-10-CM

## 2016-06-29 HISTORY — DX: Cough, unspecified: R05.9

## 2016-06-29 HISTORY — DX: High risk heterosexual behavior: Z72.51

## 2016-06-29 LAB — LIPID PANEL
CHOL/HDL RATIO: 4
Cholesterol: 226 mg/dL — ABNORMAL HIGH (ref 0–200)
HDL: 51.2 mg/dL (ref >39.00)
LDL Cholesterol: 146 mg/dL — ABNORMAL HIGH (ref 0–99)
NONHDL: 175.04
TRIGLYCERIDES: 144 mg/dL (ref 0.0–149.0)
VLDL: 28.8 mg/dL (ref 0.0–40.0)

## 2016-06-29 LAB — COMPREHENSIVE METABOLIC PANEL
ALBUMIN: 4.1 g/dL (ref 3.5–5.2)
ALT: 14 U/L (ref 0–35)
AST: 15 U/L (ref 0–37)
Alkaline Phosphatase: 80 U/L (ref 39–117)
BILIRUBIN TOTAL: 0.2 mg/dL (ref 0.2–1.2)
BUN: 8 mg/dL (ref 6–23)
CHLORIDE: 107 meq/L (ref 96–112)
CO2: 27 mEq/L (ref 19–32)
CREATININE: 0.75 mg/dL (ref 0.40–1.20)
Calcium: 8.8 mg/dL (ref 8.4–10.5)
GFR: 92.03 mL/min (ref >60.00)
Glucose, Bld: 112 mg/dL — ABNORMAL HIGH (ref 70–99)
Potassium: 3.9 mEq/L (ref 3.5–5.1)
SODIUM: 140 meq/L (ref 135–145)
TOTAL PROTEIN: 6.6 g/dL (ref 6.0–8.3)

## 2016-06-29 LAB — CBC
HEMATOCRIT: 38.1 % (ref 36.0–46.0)
Hemoglobin: 13 g/dL (ref 12.0–15.0)
MCHC: 34.2 g/dL (ref 30.0–36.0)
MCV: 92.2 fl (ref 78.0–100.0)
Platelets: 307 10*3/uL (ref 150.0–400.0)
RBC: 4.13 Mil/uL (ref 3.87–5.11)
RDW: 13 % (ref 11.5–15.5)

## 2016-06-29 LAB — TSH: TSH: 1.71 u[IU]/mL (ref 0.35–4.50)

## 2016-06-29 LAB — HEMOGLOBIN A1C: HEMOGLOBIN A1C: 5.3 % (ref 4.6–6.5)

## 2016-06-29 MED ORDER — METRONIDAZOLE 500 MG PO TABS: 500.0000 mg | ORAL_TABLET | Freq: Three times a day (TID) | ORAL | 0 refills | Status: DC

## 2016-06-29 MED ORDER — CIPROFLOXACIN HCL 500 MG PO TABS: 500.0000 mg | ORAL_TABLET | Freq: Two times a day (BID) | ORAL | 0 refills | Status: DC

## 2016-06-29 NOTE — Assessment & Plan Note (Signed)
Encouraged increased hydration and fiber in diet. Daily probiotics. If bowels not moving can use MOM 2 tbls po in 4 oz of warm prune juice by mouth every 2-3 days. If no results then repeat in 4 hours with  Dulcolax suppository pr, may repeat again in 4 more hours as needed. Seek care if symptoms worsen. Consider daily Miralax and/or Dulcolax if symptoms persist.  

## 2016-06-29 NOTE — Patient Instructions (Addendum)
Continue to work on quitting smoking.  Begin exercising at home as discussed. You have everything you need. Go for it!! Begin eating small meals throughout the day including lots of fruits and vegetables. Increase water intake and avoid sodas and sweet tea.  Encouraged increased hydration and fiber in diet. Daily probiotics. If bowels not moving can use MOM 2 tbls po in 4 oz of warm prune juice by mouth every 2-3 days. If no results then repeat in 4 hours with  Dulcolax suppository pr, may repeat again in 4 more hours as needed. Seek care if symptoms worsen. Consider daily Miralax and/or Dulcolax if symptoms persist.  Heart-Healthy Eating Plan Introduction Heart-healthy meal planning includes:  Limiting unhealthy fats.  Increasing healthy fats.  Making other small dietary changes. You may need to talk with your doctor or a diet specialist (dietitian) to create an eating plan that is right for you. What types of fat should I choose?  Choose healthy fats. These include olive oil and canola oil, flaxseeds, walnuts, almonds, and seeds.  Eat more omega-3 fats. These include salmon, mackerel, sardines, tuna, flaxseed oil, and ground flaxseeds. Try to eat fish at least twice each week.  Limit saturated fats.  Saturated fats are often found in animal products, such as meats, butter, and cream.  Plant sources of saturated fats include palm oil, palm kernel oil, and coconut oil.  Avoid foods with partially hydrogenated oils in them. These include stick margarine, some tub margarines, cookies, crackers, and other baked goods. These contain trans fats. What general guidelines do I need to follow?  Check food labels carefully. Identify foods with trans fats or high amounts of saturated fat.  Fill one half of your plate with vegetables and green salads. Eat 4-5 servings of vegetables per day. A serving of vegetables is:  1 cup of raw leafy vegetables.   cup of raw or cooked cut-up  vegetables.   cup of vegetable juice.  Fill one fourth of your plate with whole grains. Look for the word "whole" as the first word in the ingredient list.  Fill one fourth of your plate with lean protein foods.  Eat 4-5 servings of fruit per day. A serving of fruit is:  One medium whole fruit.   cup of dried fruit.   cup of fresh, frozen, or canned fruit.   cup of 100% fruit juice.  Eat more foods that contain soluble fiber. These include apples, broccoli, carrots, beans, peas, and barley. Try to get 20-30 g of fiber per day.  Eat more home-cooked food. Eat less restaurant, buffet, and fast food.  Limit or avoid alcohol.  Limit foods high in starch and sugar.  Avoid fried foods.  Avoid frying your food. Try baking, boiling, grilling, or broiling it instead. You can also reduce fat by:  Removing the skin from poultry.  Removing all visible fats from meats.  Skimming the fat off of stews, soups, and gravies before serving them.  Steaming vegetables in water or broth.  Lose weight if you are overweight.  Eat 4-5 servings of nuts, legumes, and seeds per week:  One serving of dried beans or legumes equals  cup after being cooked.  One serving of nuts equals 1 ounces.  One serving of seeds equals  ounce or one tablespoon.  You may need to keep track of how much salt or sodium you eat. This is especially true if you have high blood pressure. Talk with your doctor or dietitian to get  more information. What foods can I eat? Grains  Breads, including [place], white, pita, wheat, raisin, rye, oatmeal, and [place]. Tortillas that are neither fried nor made with lard or trans fat. Low-fat rolls, including hotdog and hamburger buns and English muffins. Biscuits. Muffins. Waffles. Pancakes. Light popcorn. Whole-grain cereals. Flatbread. Melba toast. Pretzels. Breadsticks. Rusks. Low-fat snacks. Low-fat crackers, including oyster, saltine, matzo, graham, animal, and rye.  Rice and pasta, including brown rice and pastas that are made with whole wheat. Vegetables  All vegetables. Fruits  All fruits, but limit coconut. Meats and Other Protein Sources  Lean, well-trimmed beef, veal, pork, and lamb. Chicken and [place] without skin. All fish and shellfish. Wild duck, rabbit, pheasant, and venison. Egg whites or low-cholesterol egg substitutes. Dried beans, peas, lentils, and tofu. Seeds and most nuts. Dairy  Low-fat or nonfat cheeses, including ricotta, string, and mozzarella. Skim or 1% milk that is liquid, powdered, or evaporated. Buttermilk that is made with low-fat milk. Nonfat or low-fat yogurt. Beverages  Mineral water. Diet carbonated beverages. Sweets and Desserts  Sherbets and fruit ices. Honey, jam, marmalade, jelly, and syrups. Meringues and gelatins. Pure sugar candy, such as hard candy, jelly beans, gumdrops, mints, marshmallows, and small amounts of dark chocolate. [employer] Eat all sweets and desserts in moderation. Fats and Oils  Nonhydrogenated (trans-free) margarines. Vegetable oils, including soybean, sesame, sunflower, olive, peanut, safflower, corn, canola, and cottonseed. Salad dressings or mayonnaise made with a vegetable oil. Limit added fats and oils that you use for cooking, baking, salads, and as spreads. Other  Cocoa powder. Coffee and tea. All seasonings and condiments. The items listed above may not be a complete list of recommended foods or beverages. Contact your dietitian for more options.  What foods are not recommended? Grains  Breads that are made with saturated or trans fats, oils, or whole milk. Croissants. Butter rolls. Cheese breads. Sweet rolls. Donuts. Buttered popcorn. Chow mein noodles. High-fat crackers, such as cheese or butter crackers. Meats and Other Protein Sources  Fatty meats, such as hotdogs, short ribs, sausage, spareribs, bacon, rib eye roast or steak, and mutton. High-fat deli meats, such as salami and  bologna. Caviar. Domestic duck and goose. Organ meats, such as kidney, liver, sweetbreads, and heart. Dairy  Cream, sour cream, cream cheese, and creamed cottage cheese. Whole-milk cheeses, including blue (bleu), Monte[place] Jack, [place], [place], [place], [place], Swiss, cheddar, [place], and [place]. Whole or 2% milk that is liquid, evaporated, or condensed. Whole buttermilk. Cream sauce or high-fat cheese sauce. Yogurt that is made from whole milk. Beverages  Regular sodas and juice drinks with added sugar. Sweets and Desserts  Frosting. Pudding. Cookies. Cakes other than angel food cake. Candy that has milk chocolate or white chocolate, hydrogenated fat, butter, coconut, or unknown ingredients. Buttered syrups. Full-fat ice cream or ice cream drinks. Fats and Oils  Gravy that has suet, meat fat, or shortening. Cocoa butter, hydrogenated oils, palm oil, coconut oil, palm kernel oil. These can often be found in baked products, candy, fried foods, nondairy creamers, and whipped toppings. Solid fats and shortenings, including bacon fat, salt pork, lard, and butter. Nondairy cream substitutes, such as coffee creamers and sour cream substitutes. Salad dressings that are made of unknown oils, cheese, or sour cream. The items listed above may not be a complete list of foods and beverages to avoid. Contact your dietitian for more information.  This information is not intended to replace advice given to you by your health care provider. Make sure  you discuss any questions you have with your health care provider. Document Released: 12/12/2011 Document Revised: 11/18/2015 Document Reviewed: 12/04/2013  2017 Elsevier Calorie Counting for Weight Loss Calories are energy you get from the things you eat and drink. Your body uses this energy to keep you going throughout the day. The number of calories you eat affects your weight. When you eat more calories than your body needs, your body stores the extra calories as  fat. When you eat fewer calories than your body needs, your body burns fat to get the energy it needs. Calorie counting means keeping track of how many calories you eat and drink each day. If you make sure to eat fewer calories than your body needs, you should lose weight. In order for calorie counting to work, you will need to eat the number of calories that are right for you in a day to lose a healthy amount of weight per week. A healthy amount of weight to lose per week is usually 1-2 lb (0.5-0.9 kg). A dietitian can determine how many calories you need in a day and give you suggestions on how to reach your calorie goal.  WHAT IS MY MY PLAN? My goal is to have __________ calories per day.  If I have this many calories per day, I should lose around __________ pounds per week. WHAT DO I NEED TO KNOW ABOUT CALORIE COUNTING? In order to meet your daily calorie goal, you will need to:  Find out how many calories are in each food you would like to eat. Try to do this before you eat.  Decide how much of the food you can eat.  Write down what you ate and how many calories it had. Doing this is called keeping a food log. WHERE DO I FIND CALORIE INFORMATION? The number of calories in a food can be found on a Nutrition Facts label. Note that all the information on a label is based on a specific serving of the food. If a food does not have a Nutrition Facts label, try to look up the calories online or ask your dietitian for help. HOW DO I DECIDE HOW MUCH TO EAT? To decide how much of the food you can eat, you will need to consider both the number of calories in one serving and the size of one serving. This information can be found on the Nutrition Facts label. If a food does not have a Nutrition Facts label, look up the information online or ask your dietitian for help. Remember that calories are listed per serving. If you choose to have more than one serving of a food, you will have to multiply the calories  per serving by the amount of servings you plan to eat. For example, the label on a package of bread might say that a serving size is 1 slice and that there are 90 calories in a serving. If you eat 1 slice, you will have eaten 90 calories. If you eat 2 slices, you will have eaten 180 calories. HOW DO I KEEP A FOOD LOG? After each meal, record the following information in your food log:  What you ate.  How much of it you ate.  How many calories it had.  Then, add up your calories. Keep your food log near you, such as in a small notebook in your pocket. Another option is to use a mobile app or website. Some programs will calculate calories for you and show you how many calories  you have left each time you add an item to the log. WHAT ARE SOME CALORIE COUNTING TIPS?  Use your calories on foods and drinks that will fill you up and not leave you hungry. Some examples of this include foods like nuts and nut butters, vegetables, lean proteins, and high-fiber foods (more than 5 g fiber per serving).  Eat nutritious foods and avoid empty calories. Empty calories are calories you get from foods or beverages that do not have many nutrients, such as candy and soda. It is better to have a nutritious high-calorie food (such as an avocado) than a food with few nutrients (such as a bag of chips).  Know how many calories are in the foods you eat most often. This way, you do not have to look up how many calories they have each time you eat them.  Look out for foods that may seem like low-calorie foods but are really high-calorie foods, such as baked goods, soda, and fat-free candy.  Pay attention to calories in drinks. Drinks such as sodas, specialty coffee drinks, alcohol, and juices have a lot of calories yet do not fill you up. Choose low-calorie drinks like water and diet drinks.  Focus your calorie counting efforts on higher calorie items. Logging the calories in a garden salad that contains only  vegetables is less important than calculating the calories in a milk shake.  Find a way of tracking calories that works for you. Get creative. Most people who are successful find ways to keep track of how much they eat in a day, even if they do not count every calorie. WHAT ARE SOME PORTION CONTROL TIPS?  Know how many calories are in a serving. This will help you know how many servings of a certain food you can have.  Use a measuring cup to measure serving sizes. This is helpful when you start out. With time, you will be able to estimate serving sizes for some foods.  Take some time to put servings of different foods on your favorite plates, bowls, and cups so you know what a serving looks like.  Try not to eat straight from a bag or box. Doing this can lead to overeating. Put the amount you would like to eat in a cup or on a plate to make sure you are eating the right portion.  Use smaller plates, glasses, and bowls to prevent overeating. This is a quick and easy way to practice portion control. If your plate is smaller, less food can fit on it.  Try not to multitask while eating, such as watching TV or using your computer. If it is time to eat, sit down at a table and enjoy your food. Doing this will help you to start recognizing when you are full. It will also make you more aware of what and how much you are eating. HOW CAN I CALORIE COUNT WHEN EATING OUT?  Ask for smaller portion sizes or child-sized portions.  Consider sharing an entree and sides instead of getting your own entree.  If you get your own entree, eat only half. Ask for a box at the beginning of your meal and put the rest of your entree in it so you are not tempted to eat it.  Look for the calories on the menu. If calories are listed, choose the lower calorie options.  Choose dishes that include vegetables, fruits, whole grains, low-fat dairy products, and lean protein. Focusing on smart food choices from each of the  5  food groups can help you stay on track at restaurants.  Choose items that are boiled, broiled, grilled, or steamed.  Choose water, milk, unsweetened iced tea, or other drinks without added sugars. If you want an alcoholic beverage, choose a lower calorie option. For example, a regular margarita can have up to 700 calories and a glass of wine has around 150.  Stay away from items that are buttered, battered, fried, or served with cream sauce. Items labeled "crispy" are usually fried, unless stated otherwise.  Ask for dressings, sauces, and syrups on the side. These are usually very high in calories, so do not eat much of them.  Watch out for salads. Many people think salads are a healthy option, but this is often not the case. Many salads come with bacon, fried chicken, lots of cheese, fried chips, and dressing. All of these items have a lot of calories. If you want a salad, choose a garden salad and ask for grilled meats or steak. Ask for the dressing on the side, or ask for olive oil and vinegar or lemon to use as dressing.  Estimate how many servings of a food you are given. For example, a serving of cooked rice is  cup or about the size of half a tennis ball or one cupcake wrapper. Knowing serving sizes will help you be aware of how much food you are eating at restaurants. The list below tells you how big or small some common portion sizes are based on everyday objects.  1 oz-4 stacked dice.  3 oz-1 deck of cards.  1 tsp-1 dice.  1 Tbsp- a Ping-Pong ball.  2 Tbsp-1 Ping-Pong ball.   cup-1 tennis ball or 1 cupcake wrapper.  1 cup-1 baseball. This information is not intended to replace advice given to you by your health care provider. Make sure you discuss any questions you have with your health care provider. Document Released: 06/12/2005 Document Revised: 07/03/2014 Document Reviewed: 04/17/2013 Elsevier Interactive Patient Education  2017 [employer].  Steps to Quit  Smoking Smoking tobacco can be bad for your health. It can also affect almost every organ in your body. Smoking puts you and people around you at risk for many serious long-lasting (chronic) diseases. Quitting smoking is hard, but it is one of the best things that you can do for your health. It is never too late to quit. What are the benefits of quitting smoking? When you quit smoking, you lower your risk for getting serious diseases and conditions. They can include:  Lung cancer or lung disease.  Heart disease.  Stroke.  Heart attack.  Not being able to have children (infertility).  Weak bones (osteoporosis) and broken bones (fractures). If you have coughing, wheezing, and shortness of breath, those symptoms may get better when you quit. You may also get sick less often. If you are pregnant, quitting smoking can help to lower your chances of having a baby of low birth weight. What can I do to help me quit smoking? Talk with your doctor about what can help you quit smoking. Some things you can do (strategies) include:  Quitting smoking totally, instead of slowly cutting back how much you smoke over a period of time.  Going to in-person counseling. You are more likely to quit if you go to many counseling sessions.  Using resources and support systems, such as:  Online chats with a Social worker.  Phone quitlines.  Printed Furniture conservator/restorer.  Support groups or group counseling.  Text messaging programs.  Mobile phone apps or applications.  Taking medicines. Some of these medicines may have nicotine in them. If you are pregnant or breastfeeding, do not take any medicines to quit smoking unless your doctor says it is okay. Talk with your doctor about counseling or other things that can help you. Talk with your doctor about using more than one strategy at the same time, such as taking medicines while you are also going to in-person counseling. This can help make quitting easier. What  things can I do to make it easier to quit? Quitting smoking might feel very hard at first, but there is a lot that you can do to make it easier. Take these steps:  Talk to your family and friends. Ask them to support and encourage you.  Call phone quitlines, reach out to support groups, or work with a Social worker.  Ask people who smoke to not smoke around you.  Avoid places that make you want (trigger) to smoke, such as:  Bars.  Parties.  Smoke-break areas at work.  Spend time with people who do not smoke.  Lower the stress in your life. Stress can make you want to smoke. Try these things to help your stress:  Getting regular exercise.  Deep-breathing exercises.  Yoga.  Meditating.  Doing a body scan. To do this, close your eyes, focus on one area of your body at a time from head to toe, and notice which parts of your body are tense. Try to relax the muscles in those areas.  Download or buy apps on your mobile phone or tablet that can help you stick to your quit plan. There are many free apps, such as QuitGuide from the [employer] Office manager for Disease Control and Prevention). You can find more support from smokefree.gov and other websites. This information is not intended to replace advice given to you by your health care provider. Make sure you discuss any questions you have with your health care provider. Document Released: 04/08/2009 Document Revised: 02/08/2016 Document Reviewed: 10/27/2014 Elsevier Interactive Patient Education  2017 [employer].

## 2016-06-29 NOTE — Telephone Encounter (Signed)
Patient informed of resutls/instructions. Sent in both cipro and flagyl.

## 2016-06-29 NOTE — Assessment & Plan Note (Signed)
Increase leafy greens, consider increased lean red meat and using cast iron cookware. Continue to monitor, report any concerns 

## 2016-06-29 NOTE — Assessment & Plan Note (Signed)
Encouraged heart healthy diet, increase exercise, avoid trans fats, consider a krill oil cap daily 

## 2016-06-29 NOTE — Telephone Encounter (Signed)
Patient with abdominal pain in office now with WBC of 18.5 will order CT please let her know and start Ciprofloxacin and flagyl to treat probable Diverticulitis. CIpro 500 mg po bid x 10 days, Flagyl 500 mg po tid x 10 days.

## 2016-06-29 NOTE — Addendum Note (Signed)
Addended by: Sharon Seller B on: 06/29/2016 02:22 PM   Modules accepted: Orders

## 2016-06-29 NOTE — Assessment & Plan Note (Signed)
hgba1c acceptable, minimize simple carbs. Increase exercise as tolerated. Continue current meds 

## 2016-06-29 NOTE — Assessment & Plan Note (Addendum)
Encouraged DASH diet, decrease po intake and increase exercise as tolerated. Needs 7-8 hours of sleep nightly. Avoid trans fats, eat small, frequent meals every 4-5 hours with lean proteins, complex carbs and healthy fats. Minimize simple carbs. Is referred to bariatrics for further consideration.

## 2016-06-29 NOTE — Assessment & Plan Note (Signed)
Encouraged complete cessation. Discussed need to quit as relates to risk of numerous cancers, cardiac and pulmonary disease as well as neurologic complications. Counseled for greater than 3 minutes 

## 2016-06-30 ENCOUNTER — Encounter (HOSPITAL_BASED_OUTPATIENT_CLINIC_OR_DEPARTMENT_OTHER): Payer: Self-pay

## 2016-06-30 ENCOUNTER — Ambulatory Visit (HOSPITAL_BASED_OUTPATIENT_CLINIC_OR_DEPARTMENT_OTHER)
Admission: RE | Admit: 2016-06-30 | Discharge: 2016-06-30 | Disposition: A | Discharge status: Home / Self Care | Payer: Medicare Other | Source: Ambulatory Visit | Attending: Family Medicine | Admitting: Family Medicine

## 2016-06-30 DIAGNOSIS — D72829 Elevated white blood cell count, unspecified: Secondary | ICD-10-CM | POA: Insufficient documentation

## 2016-06-30 DIAGNOSIS — R1010 Upper abdominal pain, unspecified: Secondary | ICD-10-CM | POA: Diagnosis present

## 2016-06-30 DIAGNOSIS — R1032 Left lower quadrant pain: Secondary | ICD-10-CM | POA: Diagnosis present

## 2016-06-30 LAB — HIV ANTIBODY (ROUTINE TESTING W REFLEX): HIV: NONREACTIVE

## 2016-06-30 MED ORDER — IOPAMIDOL (ISOVUE-300) INJECTION 61%
100.0000 mL | Freq: Once | INTRAVENOUS | Status: AC | PRN
  Administered 2016-06-30: 100 mL via INTRAVENOUS
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
## 2016-07-02 NOTE — Progress Notes (Signed)
HPI    Review of Systems        Physical Exam

## 2016-08-11 ENCOUNTER — Other Ambulatory Visit: Payer: Self-pay | Admitting: Family Medicine

## 2016-08-17 ENCOUNTER — Ambulatory Visit (INDEPENDENT_AMBULATORY_CARE_PROVIDER_SITE_OTHER): Payer: Medicare Other | Admitting: Family Medicine

## 2016-08-17 ENCOUNTER — Ambulatory Visit (HOSPITAL_BASED_OUTPATIENT_CLINIC_OR_DEPARTMENT_OTHER)
Admission: RE | Admit: 2016-08-17 | Discharge: 2016-08-17 | Disposition: A | Discharge status: Home / Self Care | Payer: Medicare Other | Source: Ambulatory Visit | Attending: Family Medicine | Admitting: Family Medicine

## 2016-08-17 ENCOUNTER — Encounter: Payer: Self-pay | Admitting: Family Medicine

## 2016-08-17 VITALS — BP 118/82 | HR 93 | Temp 98.2°F | Wt 225.2 lb

## 2016-08-17 DIAGNOSIS — E782 Mixed hyperlipidemia: Secondary | ICD-10-CM

## 2016-08-17 DIAGNOSIS — G8929 Other chronic pain: Secondary | ICD-10-CM

## 2016-08-17 DIAGNOSIS — D72829 Elevated white blood cell count, unspecified: Secondary | ICD-10-CM

## 2016-08-17 DIAGNOSIS — E6609 Other obesity due to excess calories: Secondary | ICD-10-CM

## 2016-08-17 DIAGNOSIS — Z8781 Personal history of (healed) traumatic fracture: Secondary | ICD-10-CM | POA: Diagnosis not present

## 2016-08-17 DIAGNOSIS — M1990 Unspecified osteoarthritis, unspecified site: Secondary | ICD-10-CM

## 2016-08-17 DIAGNOSIS — M25571 Pain in right ankle and joints of right foot: Secondary | ICD-10-CM | POA: Diagnosis not present

## 2016-08-17 DIAGNOSIS — M19071 Primary osteoarthritis, right ankle and foot: Secondary | ICD-10-CM | POA: Diagnosis not present

## 2016-08-17 DIAGNOSIS — Z981 Arthrodesis status: Secondary | ICD-10-CM | POA: Diagnosis not present

## 2016-08-17 LAB — CBC WITH DIFFERENTIAL/PLATELET
BASOS PCT: 0.4 % (ref 0.0–3.0)
Basophils Absolute: 0 10*3/uL (ref 0.0–0.1)
EOS ABS: 0.2 10*3/uL (ref 0.0–0.7)
EOS PCT: 1.5 % (ref 0.0–5.0)
HEMATOCRIT: 38 % (ref 36.0–46.0)
HEMOGLOBIN: 13 g/dL (ref 12.0–15.0)
LYMPHS PCT: 24 % (ref 12.0–46.0)
Lymphs Abs: 2.6 10*3/uL (ref 0.7–4.0)
MCHC: 34.2 g/dL (ref 30.0–36.0)
MCV: 91.5 fl (ref 78.0–100.0)
MONO ABS: 0.7 10*3/uL (ref 0.1–1.0)
Monocytes Relative: 6 % (ref 3.0–12.0)
Neutro Abs: 7.4 10*3/uL (ref 1.4–7.7)
Neutrophils Relative %: 68.1 % (ref 43.0–77.0)
Platelets: 344 10*3/uL (ref 150.0–400.0)
RBC: 4.16 Mil/uL (ref 3.87–5.11)
RDW: 12.5 % (ref 11.5–15.5)
WBC: 10.8 10*3/uL — AB (ref 4.0–10.5)

## 2016-08-17 LAB — COMPREHENSIVE METABOLIC PANEL
ALK PHOS: 95 U/L (ref 39–117)
ALT: 19 U/L (ref 0–35)
AST: 19 U/L (ref 0–37)
Albumin: 4.1 g/dL (ref 3.5–5.2)
BILIRUBIN TOTAL: 0.3 mg/dL (ref 0.2–1.2)
BUN: 10 mg/dL (ref 6–23)
CO2: 26 meq/L (ref 19–32)
CREATININE: 0.74 mg/dL (ref 0.40–1.20)
Calcium: 9 mg/dL (ref 8.4–10.5)
Chloride: 109 mEq/L (ref 96–112)
GFR: 93.4 mL/min (ref >60.00)
Glucose, Bld: 109 mg/dL — ABNORMAL HIGH (ref 70–99)
Potassium: 4.1 mEq/L (ref 3.5–5.1)
Sodium: 138 mEq/L (ref 135–145)
Total Protein: 6.9 g/dL (ref 6.0–8.3)

## 2016-08-17 NOTE — Progress Notes (Signed)
    Ankle Pain     Review of Systems    Physical Exam
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
## 2016-08-17 NOTE — Assessment & Plan Note (Addendum)
Feeling well, improving.

## 2016-08-17 NOTE — Progress Notes (Signed)
Pre visit review using our clinic review tool, if applicable. No additional management support is needed unless otherwise documented below in the visit note. 

## 2016-08-17 NOTE — Assessment & Plan Note (Signed)
Encouraged DASH diet, decrease po intake and increase exercise as tolerated. Needs 7-8 hours of sleep nightly. Avoid trans fats, eat small, frequent meals every 4-5 hours with lean proteins, complex carbs and healthy fats. Minimize simple carbs. Referred to bariatric specialist

## 2016-08-17 NOTE — Assessment & Plan Note (Signed)
With plates and screws in place sent for xray and referred

## 2016-08-17 NOTE — Patient Instructions (Addendum)
Try Lidocaine gel, aspercreme, icy not and salon pas can help Ankle Pain Many things can cause ankle pain, including an injury to the area and overuse of the ankle.The ankle joint holds your body weight and allows you to move around. Ankle pain can occur on either side or the back of one ankle or both ankles. Ankle pain may be sharp and burning or dull and aching. There may be tenderness, stiffness, redness, or warmth around the ankle. Follow these instructions at home: Activity  Rest your ankle as told by your health care provider. Avoid any activities that cause ankle pain.  Do exercises as told by your health care provider.  Ask your health care provider if you can drive. Using a brace, a bandage, or crutches  If you were given a brace:  Wear it as told by your health care provider.  Remove it when you take a bath or a shower.  Try not to move your ankle very much, but wiggle your toes from time to time. This helps to prevent swelling.  If you were given an elastic bandage:  Remove it when you take a bath or a shower.  Try not to move your ankle very much, but wiggle your toes from time to time. This helps to prevent swelling.  Adjust the bandage to make it more comfortable if it feels too tight.  Loosen the bandage if you have numbness or tingling in your foot or if your foot turns cold and blue.  If you have crutches, use them as told by your health care provider. Continue to use them until you can walk without feeling pain in your ankle. Managing pain, stiffness, and swelling  Raise (elevate) your ankle above the level of your heart while you are sitting or lying down.  If directed, apply ice to the area:  Put ice in a plastic bag.  Place a towel between your skin and the bag.  Leave the ice on for 20 minutes, 2-3 times per day. General instructions  Keep all follow-up visits as told by your health care provider. This is important.  Record this information that  may be helpful for you and your health care provider:  How often you have ankle pain.  Where the pain is located.  What the pain feels like.  Take over-the-counter and prescription medicines only as told by your health care provider. Contact a health care provider if:  Your pain gets worse.  Your pain is not relieved with medicines.  You have a fever or chills.  You are having more trouble with walking.  You have new symptoms. Get help right away if:  Your foot, leg, toes, or ankle tingles or becomes numb.  Your foot, leg, toes, or ankle becomes swollen.  Your foot, leg, toes, or ankle turns pale or blue. This information is not intended to replace advice given to you by your health care provider. Make sure you discuss any questions you have with your health care provider. Document Released: 11/30/2009 Document Revised: 02/11/2016 Document Reviewed: 01/12/2015 Elsevier Interactive Patient Education  2017 [place]  Arthritis Introduction Arthritis means joint pain. It can also mean joint disease. A joint is a place where bones come together. People who have arthritis may have:  Red joints.  Swollen joints.  Stiff joints.  Warm joints.  A fever.  A feeling of being sick. Follow these instructions at home: Pay attention to any changes in your symptoms. Take these actions to help with  your pain and swelling. Medicines  Take over-the-counter and prescription medicines only as told by your doctor.  Do not take aspirin for pain if your doctor says that you may have gout. Activity  Rest your joint if your doctor tells you to.  Avoid activities that make the pain worse.  Exercise your joint regularly as told by your doctor. Try doing exercises like:  Swimming.  Water aerobics.  Biking.  Walking. Joint Care   If your joint is swollen, keep it raised (elevated) if told by your doctor.  If your joint feels stiff in the morning, try taking a warm  shower.  If you have diabetes, do not apply heat without asking your doctor.  If told, apply heat to the joint:  Put a towel between the joint and the hot pack or heating pad.  Leave the heat on the area for 20-30 minutes.  If told, apply ice to the joint:  Put ice in a plastic bag.  Place a towel between your skin and the bag.  Leave the ice on for 20 minutes, 2-3 times per day.  Keep all follow-up visits as told by your doctor. Contact a doctor if:  The pain gets worse.  You have a fever. Get help right away if:  You have very bad pain in your joint.  You have swelling in your joint.  Your joint is red.  Many joints become painful and swollen.  You have very bad back pain.  Your leg is very weak.  You cannot control your pee (urine) or poop (stool). This information is not intended to replace advice given to you by your health care provider. Make sure you discuss any questions you have with your health care provider. Document Released: 09/06/2009 Document Revised: 11/18/2015 Document Reviewed: 09/07/2014  2017 Elsevier

## 2016-08-17 NOTE — Assessment & Plan Note (Signed)
Right ankle more and more symptomatic due to bad injury with plates and screws in place. Worse with use and in the winter. Try topical lidocaine gel, xray of ankle today

## 2016-08-21 ENCOUNTER — Encounter: Payer: Self-pay | Admitting: Family Medicine

## 2016-08-22 ENCOUNTER — Encounter (INDEPENDENT_AMBULATORY_CARE_PROVIDER_SITE_OTHER): Payer: Self-pay | Admitting: Orthopaedic Surgery

## 2016-08-22 ENCOUNTER — Ambulatory Visit (INDEPENDENT_AMBULATORY_CARE_PROVIDER_SITE_OTHER): Payer: Medicare Other | Admitting: Orthopaedic Surgery

## 2016-08-22 DIAGNOSIS — Z969 Presence of functional implant, unspecified: Secondary | ICD-10-CM | POA: Diagnosis not present
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
## 2016-08-27 NOTE — Assessment & Plan Note (Signed)
Encouraged heart healthy diet, increase exercise, avoid trans fats, consider a krill oil cap daily 

## 2016-09-01 ENCOUNTER — Other Ambulatory Visit (INDEPENDENT_AMBULATORY_CARE_PROVIDER_SITE_OTHER): Payer: Self-pay

## 2016-09-01 DIAGNOSIS — Z96661 Presence of right artificial ankle joint: Secondary | ICD-10-CM | POA: Diagnosis not present

## 2016-09-01 HISTORY — PX: ANKLE SURGERY: SHX546

## 2016-09-14 ENCOUNTER — Ambulatory Visit (INDEPENDENT_AMBULATORY_CARE_PROVIDER_SITE_OTHER): Payer: Medicare Other | Admitting: Orthopaedic Surgery

## 2016-09-14 DIAGNOSIS — Z969 Presence of functional implant, unspecified: Secondary | ICD-10-CM

## 2016-09-14 DIAGNOSIS — M25571 Pain in right ankle and joints of right foot: Secondary | ICD-10-CM | POA: Diagnosis not present

## 2016-09-14 NOTE — Progress Notes (Signed)
Phyllis Washington is 2 weeks status post hardware removal from her ankle. She is doing well. She has noticed a significant improvement. The incisions have healed very no signs of infection. Sutures were removed. Increase activity as tolerated. Released to full activity in 4 weeks. Just walking for now.

## 2016-09-19 ENCOUNTER — Telehealth (INDEPENDENT_AMBULATORY_CARE_PROVIDER_SITE_OTHER): Payer: Self-pay | Admitting: *Deleted

## 2016-09-19 NOTE — Telephone Encounter (Signed)
Vitamin E, not D.

## 2016-09-19 NOTE — Telephone Encounter (Signed)
Patient called in this afternoon in regards to wanting to know if she could put Vitamin D ointment on her incision site. She had surgery on 09-01-16. Her CB # [phone]. Thank you

## 2016-09-19 NOTE — Telephone Encounter (Signed)
Please advise 

## 2016-09-20 ENCOUNTER — Encounter: Payer: Self-pay | Admitting: Family Medicine

## 2016-09-20 NOTE — Telephone Encounter (Signed)
LMOM

## 2016-09-21 ENCOUNTER — Telehealth: Payer: Self-pay

## 2016-09-21 MED ORDER — TIZANIDINE HCL 4 MG PO TABS: 4.0000 mg | ORAL_TABLET | Freq: Four times a day (QID) | ORAL | 1 refills | Status: DC | PRN

## 2016-09-21 MED ORDER — LIDOCAINE 5 % EX OINT: 1.0000 "application " | TOPICAL_OINTMENT | CUTANEOUS | 0 refills | Status: DC | PRN

## 2016-09-21 NOTE — Telephone Encounter (Signed)
PA initiated via Covermymeds; KEY: [id number]. Awaiting determination.

## 2016-10-05 ENCOUNTER — Telehealth: Payer: Self-pay | Admitting: Family Medicine

## 2016-10-05 NOTE — Telephone Encounter (Signed)
Please switch to Ventolin with same sig, disp #1 with 5 rf

## 2016-10-05 NOTE — Telephone Encounter (Signed)
Fax from pharmacy to inform proair is not covered. They will pay for Ventolin---Needs prescription for this.

## 2016-10-06 MED ORDER — ALBUTEROL SULFATE HFA 108 (90 BASE) MCG/ACT IN AERS: 2.0000 | INHALATION_SPRAY | Freq: Four times a day (QID) | RESPIRATORY_TRACT | 5 refills | Status: DC | PRN

## 2016-10-06 NOTE — Telephone Encounter (Signed)
Sent in inhaler.

## 2016-10-09 NOTE — Telephone Encounter (Signed)
Received PA denial. Lidocaine ointment must be used for medically-accepted conditions per FDA to be covered under Part D plan. Please advise.

## 2016-10-09 NOTE — Telephone Encounter (Signed)
They will not cover this med. She will have to buy it OTC if she wants it. Aspercreme, Salon Pas and [employer] all make a Lidocaine gel and patch now

## 2016-10-09 NOTE — Telephone Encounter (Signed)
MyChart message sent to Pt. PA denial notification sent for scanning.

## 2016-10-27 ENCOUNTER — Other Ambulatory Visit: Payer: Self-pay | Admitting: Family Medicine

## 2016-11-16 ENCOUNTER — Encounter: Payer: Self-pay | Admitting: Family Medicine

## 2016-11-16 ENCOUNTER — Ambulatory Visit (INDEPENDENT_AMBULATORY_CARE_PROVIDER_SITE_OTHER): Payer: Medicare Other | Admitting: Family Medicine

## 2016-11-16 DIAGNOSIS — Z969 Presence of functional implant, unspecified: Secondary | ICD-10-CM | POA: Diagnosis not present

## 2016-11-16 DIAGNOSIS — Z720 Tobacco use: Secondary | ICD-10-CM | POA: Diagnosis not present

## 2016-11-16 DIAGNOSIS — E6609 Other obesity due to excess calories: Secondary | ICD-10-CM | POA: Diagnosis not present

## 2016-11-16 DIAGNOSIS — K5900 Constipation, unspecified: Secondary | ICD-10-CM | POA: Diagnosis not present

## 2016-11-16 DIAGNOSIS — R739 Hyperglycemia, unspecified: Secondary | ICD-10-CM | POA: Diagnosis not present

## 2016-11-16 DIAGNOSIS — E782 Mixed hyperlipidemia: Secondary | ICD-10-CM

## 2016-11-16 LAB — COMPREHENSIVE METABOLIC PANEL
ALT: 15 U/L (ref 0–35)
AST: 17 U/L (ref 0–37)
Albumin: 4.4 g/dL (ref 3.5–5.2)
Alkaline Phosphatase: 87 U/L (ref 39–117)
BUN: 11 mg/dL (ref 6–23)
CHLORIDE: 107 meq/L (ref 96–112)
CO2: 26 meq/L (ref 19–32)
CREATININE: 0.81 mg/dL (ref 0.40–1.20)
Calcium: 9 mg/dL (ref 8.4–10.5)
GFR: 84.04 mL/min (ref >60.00)
GLUCOSE: 100 mg/dL — AB (ref 70–99)
POTASSIUM: 3.9 meq/L (ref 3.5–5.1)
SODIUM: 137 meq/L (ref 135–145)
Total Bilirubin: 0.4 mg/dL (ref 0.2–1.2)
Total Protein: 7.1 g/dL (ref 6.0–8.3)

## 2016-11-16 LAB — CBC WITH DIFFERENTIAL/PLATELET
BASOS PCT: 0.5 % (ref 0.0–3.0)
Basophils Absolute: 0 10*3/uL (ref 0.0–0.1)
EOS ABS: 0.1 10*3/uL (ref 0.0–0.7)
Eosinophils Relative: 1.5 % (ref 0.0–5.0)
HCT: 38.7 % (ref 36.0–46.0)
HEMOGLOBIN: 13.2 g/dL (ref 12.0–15.0)
LYMPHS ABS: 2.6 10*3/uL (ref 0.7–4.0)
Lymphocytes Relative: 28.9 % (ref 12.0–46.0)
MCHC: 34.2 g/dL (ref 30.0–36.0)
MCV: 92.4 fl (ref 78.0–100.0)
MONO ABS: 0.4 10*3/uL (ref 0.1–1.0)
Monocytes Relative: 4 % (ref 3.0–12.0)
NEUTROS ABS: 5.9 10*3/uL (ref 1.4–7.7)
NEUTROS PCT: 65.1 % (ref 43.0–77.0)
PLATELETS: 308 10*3/uL (ref 150.0–400.0)
RBC: 4.19 Mil/uL (ref 3.87–5.11)
RDW: 13 % (ref 11.5–15.5)
WBC: 9.1 10*3/uL (ref 4.0–10.5)

## 2016-11-16 LAB — LIPID PANEL
Cholesterol: 229 mg/dL — ABNORMAL HIGH (ref 0–200)
HDL: 43.4 mg/dL (ref >39.00)
LDL CALC: 164 mg/dL — AB (ref 0–99)
NONHDL: 185.85
Total CHOL/HDL Ratio: 5
Triglycerides: 107 mg/dL (ref 0.0–149.0)
VLDL: 21.4 mg/dL (ref 0.0–40.0)

## 2016-11-16 LAB — HEMOGLOBIN A1C: Hgb A1c MFr Bld: 5.7 % (ref 4.6–6.5)

## 2016-11-16 LAB — TSH: TSH: 1.6 u[IU]/mL (ref 0.35–4.50)

## 2016-11-16 MED ORDER — NICOTINE 7 MG/24HR TD PT24: 7.0000 mg | MEDICATED_PATCH | Freq: Every day | TRANSDERMAL | 1 refills | Status: DC

## 2016-11-16 MED ORDER — NICOTINE 14 MG/24HR TD PT24: 14.0000 mg | MEDICATED_PATCH | Freq: Every day | TRANSDERMAL | 1 refills | Status: DC

## 2016-11-16 NOTE — Assessment & Plan Note (Signed)
Check CBC diff

## 2016-11-16 NOTE — Assessment & Plan Note (Signed)
hgba1c acceptable, minimize simple carbs. Increase exercise as tolerated.  

## 2016-11-16 NOTE — Progress Notes (Signed)
Pre visit review using our clinic review tool, if applicable. No additional management support is needed unless otherwise documented below in the visit note. 

## 2016-11-16 NOTE — Patient Instructions (Signed)
Spoke with you today about Dr. Migdalia Dk [facility] program.  Ankle Pain Many things can cause ankle pain, including an injury to the area and overuse of the ankle.The ankle joint holds your body weight and allows you to move around. Ankle pain can occur on either side or the back of one ankle or both ankles. Ankle pain may be sharp and burning or dull and aching. There may be tenderness, stiffness, redness, or warmth around the ankle. Follow these instructions at home: Activity   Rest your ankle as told by your health care provider. Avoid any activities that cause ankle pain.  Do exercises as told by your health care provider.  Ask your health care provider if you can drive. Using a brace, a bandage, or crutches   If you were given a brace:  Wear it as told by your health care provider.  Remove it when you take a bath or a shower.  Try not to move your ankle very much, but wiggle your toes from time to time. This helps to prevent swelling.  If you were given an elastic bandage:  Remove it when you take a bath or a shower.  Try not to move your ankle very much, but wiggle your toes from time to time. This helps to prevent swelling.  Adjust the bandage to make it more comfortable if it feels too tight.  Loosen the bandage if you have numbness or tingling in your foot or if your foot turns cold and blue.  If you have crutches, use them as told by your health care provider. Continue to use them until you can walk without feeling pain in your ankle. Managing pain, stiffness, and swelling   Raise (elevate) your ankle above the level of your heart while you are sitting or lying down.  If directed, apply ice to the area:  Put ice in a plastic bag.  Place a towel between your skin and the bag.  Leave the ice on for 20 minutes, 2-3 times per day. General instructions   Keep all follow-up visits as told by your health care provider. This is important.  Record this  information that may be helpful for you and your health care provider:  How often you have ankle pain.  Where the pain is located.  What the pain feels like.  Take over-the-counter and prescription medicines only as told by your health care provider. Contact a health care provider if:  Your pain gets worse.  Your pain is not relieved with medicines.  You have a fever or chills.  You are having more trouble with walking.  You have new symptoms. Get help right away if:  Your foot, leg, toes, or ankle tingles or becomes numb.  Your foot, leg, toes, or ankle becomes swollen.  Your foot, leg, toes, or ankle turns pale or blue. This information is not intended to replace advice given to you by your health care provider. Make sure you discuss any questions you have with your health care provider. Document Released: 11/30/2009 Document Revised: 02/11/2016 Document Reviewed: 01/12/2015 Elsevier Interactive Patient Education  2017 [employer].

## 2016-11-16 NOTE — Assessment & Plan Note (Signed)
Encouraged heart healthy diet, increase exercise, avoid trans fats, consider a krill oil cap daily 

## 2016-11-16 NOTE — Assessment & Plan Note (Signed)
Right ankle repair with removal of hardware is feeling much better although it does still have some swelling but much better. Was done by Dr Erlinda Hong, she will call them if symptoms worsen

## 2016-11-16 NOTE — Assessment & Plan Note (Signed)
Encouraged DASH diet, decrease po intake and increase exercise as tolerated. Needs 7-8 hours of sleep nightly. Avoid trans fats, eat small, frequent meals every 4-5 hours with lean proteins, complex carbs and healthy fats. Minimize simple carbs, bariatric referral 
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
## 2016-11-16 NOTE — Assessment & Plan Note (Addendum)
Encouraged complete cessation. Discussed need to quit as relates to risk of numerous cancers, cardiac and pulmonary disease as well as neurologic complications. Counseled for greater than 3 minutes. 14 mg patch topically daily x 60 days and then 7 mg daily x 60 days

## 2016-11-20 NOTE — Assessment & Plan Note (Signed)
Encouraged increased hydration and fiber in diet. Daily probiotics. If bowels not moving can use MOM 2 tbls po in 4 oz of warm prune juice by mouth every 2-3 days. If no results then repeat in 4 hours with  Dulcolax suppository pr, may repeat again in 4 more hours as needed. Seek care if symptoms worsen. Consider daily Miralax and/or Dulcolax if symptoms persist.  

## 2016-12-20 ENCOUNTER — Other Ambulatory Visit: Payer: Self-pay | Admitting: Family Medicine

## 2016-12-20 DIAGNOSIS — J209 Acute bronchitis, unspecified: Secondary | ICD-10-CM

## 2016-12-20 DIAGNOSIS — K5900 Constipation, unspecified: Secondary | ICD-10-CM

## 2016-12-20 DIAGNOSIS — Z720 Tobacco use: Secondary | ICD-10-CM

## 2016-12-20 DIAGNOSIS — D649 Anemia, unspecified: Secondary | ICD-10-CM

## 2016-12-20 DIAGNOSIS — E782 Mixed hyperlipidemia: Secondary | ICD-10-CM

## 2016-12-21 ENCOUNTER — Other Ambulatory Visit: Payer: Self-pay | Admitting: Family Medicine

## 2016-12-21 DIAGNOSIS — K5900 Constipation, unspecified: Secondary | ICD-10-CM

## 2016-12-21 DIAGNOSIS — J209 Acute bronchitis, unspecified: Secondary | ICD-10-CM

## 2016-12-21 DIAGNOSIS — D649 Anemia, unspecified: Secondary | ICD-10-CM

## 2016-12-21 DIAGNOSIS — E782 Mixed hyperlipidemia: Secondary | ICD-10-CM

## 2016-12-21 DIAGNOSIS — Z720 Tobacco use: Secondary | ICD-10-CM

## 2016-12-21 NOTE — Telephone Encounter (Signed)
Ok to refill but needs uds and contract.

## 2016-12-21 NOTE — Telephone Encounter (Signed)
Pt is requesting refill on Tramadol 50mg .  Last OV: 11/16/2016 Last Fill: 06/22/2016 #60 and 0RF UDS: None  Please advise.

## 2016-12-22 ENCOUNTER — Encounter: Payer: Self-pay | Admitting: Family Medicine

## 2016-12-22 NOTE — Telephone Encounter (Signed)
Patient made aware to pick up rx do the UDS and signed the contract she stated she will come Monday.   pc

## 2017-03-16 ENCOUNTER — Ambulatory Visit: Payer: Medicare Other | Admitting: Family Medicine

## 2017-04-16 ENCOUNTER — Encounter: Payer: Self-pay | Admitting: Family Medicine

## 2017-04-16 ENCOUNTER — Ambulatory Visit (INDEPENDENT_AMBULATORY_CARE_PROVIDER_SITE_OTHER): Payer: Medicare Other | Admitting: Family Medicine

## 2017-04-16 VITALS — BP 102/78 | HR 73 | Temp 97.9°F | Resp 18 | Wt 224.4 lb

## 2017-04-16 DIAGNOSIS — E6609 Other obesity due to excess calories: Secondary | ICD-10-CM

## 2017-04-16 DIAGNOSIS — K5900 Constipation, unspecified: Secondary | ICD-10-CM

## 2017-04-16 DIAGNOSIS — R32 Unspecified urinary incontinence: Secondary | ICD-10-CM | POA: Diagnosis not present

## 2017-04-16 DIAGNOSIS — R739 Hyperglycemia, unspecified: Secondary | ICD-10-CM

## 2017-04-16 DIAGNOSIS — E782 Mixed hyperlipidemia: Secondary | ICD-10-CM | POA: Diagnosis not present

## 2017-04-16 DIAGNOSIS — Z720 Tobacco use: Secondary | ICD-10-CM

## 2017-04-16 HISTORY — DX: Unspecified urinary incontinence: R32

## 2017-04-16 LAB — COMPREHENSIVE METABOLIC PANEL
ALBUMIN: 4 g/dL (ref 3.5–5.2)
ALK PHOS: 80 U/L (ref 39–117)
ALT: 12 U/L (ref 0–35)
AST: 15 U/L (ref 0–37)
BILIRUBIN TOTAL: 0.3 mg/dL (ref 0.2–1.2)
BUN: 11 mg/dL (ref 6–23)
CALCIUM: 8.9 mg/dL (ref 8.4–10.5)
CHLORIDE: 105 meq/L (ref 96–112)
CO2: 25 mEq/L (ref 19–32)
CREATININE: 0.74 mg/dL (ref 0.40–1.20)
GFR: 93.08 mL/min (ref >60.00)
Glucose, Bld: 99 mg/dL (ref 70–99)
Potassium: 4.2 mEq/L (ref 3.5–5.1)
SODIUM: 137 meq/L (ref 135–145)
TOTAL PROTEIN: 6.7 g/dL (ref 6.0–8.3)

## 2017-04-16 LAB — URINALYSIS
Bilirubin Urine: NEGATIVE
HGB URINE DIPSTICK: NEGATIVE
KETONES UR: NEGATIVE
Leukocytes, UA: NEGATIVE
Nitrite: NEGATIVE
Specific Gravity, Urine: <=1.005 — AB (ref 1.000–1.030)
TOTAL PROTEIN, URINE-UPE24: NEGATIVE
URINE GLUCOSE: NEGATIVE
Urobilinogen, UA: 0.2 (ref 0.0–1.0)
pH: 6 (ref 5.0–8.0)

## 2017-04-16 LAB — LIPID PANEL
CHOLESTEROL: 225 mg/dL — AB (ref 0–200)
HDL: 51.4 mg/dL (ref >39.00)
LDL Cholesterol: 156 mg/dL — ABNORMAL HIGH (ref 0–99)
NonHDL: 174.02
TRIGLYCERIDES: 89 mg/dL (ref 0.0–149.0)
Total CHOL/HDL Ratio: 4
VLDL: 17.8 mg/dL (ref 0.0–40.0)

## 2017-04-16 LAB — CBC
HCT: 37.6 % (ref 36.0–46.0)
HEMOGLOBIN: 12.6 g/dL (ref 12.0–15.0)
MCHC: 33.5 g/dL (ref 30.0–36.0)
MCV: 91.7 fl (ref 78.0–100.0)
PLATELETS: 309 10*3/uL (ref 150.0–400.0)
RBC: 4.1 Mil/uL (ref 3.87–5.11)
RDW: 12.7 % (ref 11.5–15.5)
WBC: 7.7 10*3/uL (ref 4.0–10.5)

## 2017-04-16 LAB — TSH: TSH: 3.46 u[IU]/mL (ref 0.35–4.50)

## 2017-04-16 LAB — HEMOGLOBIN A1C: Hgb A1c MFr Bld: 5.7 % (ref 4.6–6.5)

## 2017-04-16 MED ORDER — TIZANIDINE HCL 4 MG PO TABS: 4.0000 mg | ORAL_TABLET | Freq: Four times a day (QID) | ORAL | 1 refills | Status: DC | PRN

## 2017-04-16 NOTE — Assessment & Plan Note (Signed)
hgba1c acceptable, minimize simple carbs. Increase exercise as tolerated. Continue current meds 
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
## 2017-04-16 NOTE — Patient Instructions (Addendum)
[facility] [employer] Lidocaine gel to nerve and ice   Encouraged increased hydration and fiber in diet. Daily probiotics. If bowels not moving can use MOM 2 tbls po in 4 oz of warm prune juice by mouth every 2-3 days. If no results then repeat in 4 hours with  Dulcolax suppository pr, may repeat again in 4 more hours as needed. Seek care if symptoms worsen. Consider daily Miralax and/or Dulcolax if symptoms persist.   Add Miralax with Benefiber twice daily   Constipation, Adult Constipation is when a person:  Poops (has a bowel movement) fewer times in a week than normal.  Has a hard time pooping.  Has poop that is dry, hard, or bigger than normal.  Follow these instructions at home: Eating and drinking   Eat foods that have a lot of fiber, such as: ? Fresh fruits and vegetables. ? Whole grains. ? Beans.  Eat less of foods that are high in fat, low in fiber, or overly processed, such as: ? [place] fries. ? Hamburgers. ? Cookies. ? Candy. ? Soda.  Drink enough fluid to keep your pee (urine) clear or pale yellow. General instructions  Exercise regularly or as told by your doctor.  Go to the restroom when you feel like you need to poop. Do not hold it in.  Take over-the-counter and prescription medicines only as told by your doctor. These include any fiber supplements.  Do pelvic floor retraining exercises, such as: ? Doing deep breathing while relaxing your lower belly (abdomen). ? Relaxing your pelvic floor while pooping.  Watch your condition for any changes.  Keep all follow-up visits as told by your doctor. This is important. Contact a doctor if:  You have pain that gets worse.  You have a fever.  You have not pooped for 4 days.  You throw up (vomit).  You are not hungry.  You lose weight.  You are bleeding from the anus.  You have thin, pencil-like poop (stool). Get help right away if:  You have a fever, and your symptoms suddenly  get worse.  You leak poop or have blood in your poop.  Your belly feels hard or bigger than normal (is bloated).  You have very bad belly pain.  You feel dizzy or you faint. This information is not intended to replace advice given to you by your health care provider. Make sure you discuss any questions you have with your health care provider. Document Released: 11/29/2007 Document Revised: 12/31/2015 Document Reviewed: 12/01/2015 Elsevier Interactive Patient Education  2017 [employer].

## 2017-04-16 NOTE — Assessment & Plan Note (Signed)
Encouraged complete cessation. Discussed need to quit as relates to risk of numerous cancers, cardiac and pulmonary disease as well as neurologic complications. Counseled for greater than 3 minutes 

## 2017-04-16 NOTE — Assessment & Plan Note (Signed)
Tolerating statin, encouraged heart healthy diet, avoid trans fats, minimize simple carbs and saturated fats. Increase exercise as tolerated 

## 2017-04-16 NOTE — Assessment & Plan Note (Signed)
Check UA and c and s today and referred to urology, try Kegel exercises tid sets of 10

## 2017-04-16 NOTE — Assessment & Plan Note (Signed)
Encouraged DASH diet, decrease po intake and increase exercise as tolerated. Needs 7-8 hours of sleep nightly. Avoid trans fats, eat small, frequent meals every 4-5 hours with lean proteins, complex carbs and healthy fats. Minimize simple carbs, bariatric referral 

## 2017-04-16 NOTE — Assessment & Plan Note (Signed)
Encouraged increased hydration and fiber in diet. Daily probiotics. If bowels not moving can use MOM 2 tbls po in 4 oz of warm prune juice by mouth every 2-3 days. If no results then repeat in 4 hours with  Dulcolax suppository pr, may repeat again in 4 more hours as needed. Seek care if symptoms worsen. Consider daily Miralax and/or Dulcolax if symptoms persist. Try Benefiber and Miralax twice daily

## 2017-04-17 ENCOUNTER — Other Ambulatory Visit: Payer: Self-pay | Admitting: Family Medicine

## 2017-04-17 LAB — URINE CULTURE
MICRO NUMBER: 81177836
RESULT: NO GROWTH
SPECIMEN QUALITY: ADEQUATE

## 2017-06-16 ENCOUNTER — Other Ambulatory Visit: Payer: Self-pay | Admitting: Family Medicine

## 2017-07-17 ENCOUNTER — Telehealth: Payer: Self-pay | Admitting: Family Medicine

## 2017-07-17 NOTE — Telephone Encounter (Signed)
Copied from [place] [id number]. Topic: Quick Communication - Other Results >> Jul 17, 2017 12:28 PM Rosalin Hawking wrote: Pt never picked up rx for Tramadol (ultram) 50 mg tablet 60 tablet with date December 22, 2016. Rx was shredded.

## 2017-10-22 ENCOUNTER — Other Ambulatory Visit: Payer: Self-pay | Admitting: Family Medicine

## 2017-10-25 ENCOUNTER — Encounter: Payer: Self-pay | Admitting: Family Medicine

## 2017-10-25 ENCOUNTER — Ambulatory Visit (INDEPENDENT_AMBULATORY_CARE_PROVIDER_SITE_OTHER): Payer: Medicare Other | Admitting: Family Medicine

## 2017-10-25 VITALS — BP 121/82 | HR 90 | Temp 97.9°F | Resp 16 | Ht 66.0 in | Wt 217.2 lb

## 2017-10-25 DIAGNOSIS — R739 Hyperglycemia, unspecified: Secondary | ICD-10-CM

## 2017-10-25 DIAGNOSIS — E6609 Other obesity due to excess calories: Secondary | ICD-10-CM | POA: Diagnosis not present

## 2017-10-25 DIAGNOSIS — Z720 Tobacco use: Secondary | ICD-10-CM

## 2017-10-25 DIAGNOSIS — Z0000 Encounter for general adult medical examination without abnormal findings: Secondary | ICD-10-CM

## 2017-10-25 DIAGNOSIS — Z0001 Encounter for general adult medical examination with abnormal findings: Secondary | ICD-10-CM

## 2017-10-25 DIAGNOSIS — Z9109 Other allergy status, other than to drugs and biological substances: Secondary | ICD-10-CM

## 2017-10-25 DIAGNOSIS — K5900 Constipation, unspecified: Secondary | ICD-10-CM

## 2017-10-25 DIAGNOSIS — E782 Mixed hyperlipidemia: Secondary | ICD-10-CM

## 2017-10-25 LAB — COMPREHENSIVE METABOLIC PANEL
ALT: 19 U/L (ref 0–35)
AST: 24 U/L (ref 0–37)
Albumin: 4.2 g/dL (ref 3.5–5.2)
Alkaline Phosphatase: 96 U/L (ref 39–117)
BILIRUBIN TOTAL: 0.3 mg/dL (ref 0.2–1.2)
BUN: 9 mg/dL (ref 6–23)
CALCIUM: 8.8 mg/dL (ref 8.4–10.5)
CO2: 24 meq/L (ref 19–32)
CREATININE: 0.77 mg/dL (ref 0.40–1.20)
Chloride: 104 mEq/L (ref 96–112)
GFR: 88.66 mL/min (ref >60.00)
GLUCOSE: 100 mg/dL — AB (ref 70–99)
Potassium: 3.9 mEq/L (ref 3.5–5.1)
Sodium: 136 mEq/L (ref 135–145)
TOTAL PROTEIN: 6.9 g/dL (ref 6.0–8.3)

## 2017-10-25 LAB — CBC
HCT: 35.2 % — ABNORMAL LOW (ref 36.0–46.0)
Hemoglobin: 12 g/dL (ref 12.0–15.0)
MCHC: 34.1 g/dL (ref 30.0–36.0)
MCV: 90.7 fl (ref 78.0–100.0)
PLATELETS: 293 10*3/uL (ref 150.0–400.0)
RBC: 3.88 Mil/uL (ref 3.87–5.11)
RDW: 13.7 % (ref 11.5–15.5)
WBC: 9.6 10*3/uL (ref 4.0–10.5)

## 2017-10-25 LAB — TSH: TSH: 2.08 u[IU]/mL (ref 0.35–4.50)

## 2017-10-25 LAB — LIPID PANEL
CHOL/HDL RATIO: 4
Cholesterol: 197 mg/dL (ref 0–200)
HDL: 54.9 mg/dL (ref >39.00)
LDL Cholesterol: 126 mg/dL — ABNORMAL HIGH (ref 0–99)
NONHDL: 141.95
Triglycerides: 79 mg/dL (ref 0.0–149.0)
VLDL: 15.8 mg/dL (ref 0.0–40.0)

## 2017-10-25 LAB — HEMOGLOBIN A1C: HEMOGLOBIN A1C: 5.4 % (ref 4.6–6.5)

## 2017-10-25 MED ORDER — MONTELUKAST SODIUM 10 MG PO TABS: 10.0000 mg | ORAL_TABLET | Freq: Every evening | ORAL | 5 refills | Status: DC | PRN

## 2017-10-25 MED ORDER — FLUTICASONE PROPIONATE 50 MCG/ACT NA SUSP: 2.0000 | Freq: Every day | NASAL | 5 refills | Status: DC | PRN

## 2017-10-25 MED ORDER — CETIRIZINE HCL 10 MG PO TABS: 10.0000 mg | ORAL_TABLET | Freq: Every day | ORAL | 5 refills | Status: DC | PRN

## 2017-10-25 NOTE — Patient Instructions (Signed)
Encouraged increased rest and hydration, add probiotics, zinc such as Coldeze or Xicam. Treat fevers as needed. Vitamin C 500 mg, Elderberry liquid, Mucinex twice daily  Preventive Care 18-39 Years, Female Preventive care refers to lifestyle choices and visits with your health care provider that can promote health and wellness. What does preventive care include?  A yearly physical exam. This is also called an annual well check.  Dental exams once or twice a year.  Routine eye exams. Ask your health care provider how often you should have your eyes checked.  Personal lifestyle choices, including: ? Daily care of your teeth and gums. ? Regular physical activity. ? Eating a healthy diet. ? Avoiding tobacco and drug use. ? Limiting alcohol use. ? Practicing safe sex. ? Taking vitamin and mineral supplements as recommended by your health care provider. What happens during an annual well check? The services and screenings done by your health care provider during your annual well check will depend on your age, overall health, lifestyle risk factors, and family history of disease. Counseling Your health care provider may ask you questions about your:  Alcohol use.  Tobacco use.  Drug use.  Emotional well-being.  Home and relationship well-being.  Sexual activity.  Eating habits.  Work and work Statistician.  Method of birth control.  Menstrual cycle.  Pregnancy history.  Screening You may have the following tests or measurements:  Height, weight, and BMI.  Diabetes screening. This is done by checking your blood sugar (glucose) after you have not eaten for a while (fasting).  Blood pressure.  Lipid and cholesterol levels. These may be checked every 5 years starting at age 39.  Skin check.  Hepatitis C blood test.  Hepatitis B blood test.  Sexually transmitted disease (STD) testing.  BRCA-related cancer screening. This may be done if you have a family history of  breast, ovarian, tubal, or peritoneal cancers.  Pelvic exam and Pap test. This may be done every 3 years starting at age 39. Starting at age 39, this may be done every 5 years if you have a Pap test in combination with an HPV test.  Discuss your test results, treatment options, and if necessary, the need for more tests with your health care provider. Vaccines Your health care provider may recommend certain vaccines, such as:  Influenza vaccine. This is recommended every year.  Tetanus, diphtheria, and acellular pertussis (Tdap, Td) vaccine. You may need a Td booster every 10 years.  Varicella vaccine. You may need this if you have not been vaccinated.  HPV vaccine. If you are 76 or younger, you may need three doses over 6 months.  Measles, mumps, and rubella (MMR) vaccine. You may need at least one dose of MMR. You may also need a second dose.  Pneumococcal 13-valent conjugate (PCV13) vaccine. You may need this if you have certain conditions and were not previously vaccinated.  Pneumococcal polysaccharide (PPSV23) vaccine. You may need one or two doses if you smoke cigarettes or if you have certain conditions.  Meningococcal vaccine. One dose is recommended if you are age 36-21 years and a first-year college student living in a residence hall, or if you have one of several medical conditions. You may also need additional booster doses.  Hepatitis A vaccine. You may need this if you have certain conditions or if you travel or work in places where you may be exposed to hepatitis A.  Hepatitis B vaccine. You may need this if you have certain conditions or  if you travel or work in places where you may be exposed to hepatitis B.  Haemophilus influenzae type b (Hib) vaccine. You may need this if you have certain risk factors.  Talk to your health care provider about which screenings and vaccines you need and how often you need them. This information is not intended to replace advice given to  you by your health care provider. Make sure you discuss any questions you have with your health care provider. Document Released: 08/08/2001 Document Revised: 03/01/2016 Document Reviewed: 04/13/2015 Elsevier Interactive Patient Education  [employer].

## 2017-10-25 NOTE — Assessment & Plan Note (Signed)
hgba1c acceptable, minimize simple carbs. Increase exercise as tolerated.  

## 2017-10-25 NOTE — Assessment & Plan Note (Signed)
Patient encouraged to maintain heart healthy diet, regular exercise, adequate sleep. Consider daily probiotics. Take medications as prescribed. Labs reviewewd

## 2017-10-25 NOTE — Assessment & Plan Note (Addendum)
Encouraged complete cessation. Discussed need to quit as relates to risk of numerous cancers, cardiac and pulmonary disease as well as neurologic complications. Counseled for greater than 3 minutes 

## 2017-10-25 NOTE — Assessment & Plan Note (Signed)
Encouraged heart healthy diet, increase exercise, avoid trans fats, consider a krill oil cap daily 
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
## 2017-10-25 NOTE — Assessment & Plan Note (Signed)
Encouraged increased hydration and fiber in diet. Daily probiotics. If bowels not moving can use MOM 2 tbls po in 4 oz of warm prune juice by mouth every 2-3 days. If no results then repeat in 4 hours with  Dulcolax suppository pr, may repeat again in 4 more hours as needed. Seek care if symptoms worsen. Consider daily Miralax and/or Dulcolax if symptoms persist.  

## 2017-10-25 NOTE — Assessment & Plan Note (Signed)
Encouraged DASH diet, decrease po intake and increase exercise as tolerated. Needs 7-8 hours of sleep nightly. Avoid trans fats, eat small, frequent meals every 4-5 hours with lean proteins, complex carbs and healthy fats. Minimize simple carbs 

## 2017-11-26 ENCOUNTER — Other Ambulatory Visit: Payer: Self-pay | Admitting: Family Medicine

## 2017-11-26 ENCOUNTER — Telehealth: Payer: Self-pay | Admitting: Family Medicine

## 2017-11-26 ENCOUNTER — Encounter: Payer: Self-pay | Admitting: Family Medicine

## 2017-11-26 DIAGNOSIS — E611 Iron deficiency: Secondary | ICD-10-CM

## 2017-11-26 DIAGNOSIS — D649 Anemia, unspecified: Secondary | ICD-10-CM

## 2017-11-26 NOTE — Telephone Encounter (Signed)
Copied from [place]. Topic: Quick Communication - Rx Refill/Question >> Nov 26, 2017 12:16 PM Phyllis Washington wrote: Medication: iron pills  Pt gave blood this AM and is aware her hemoglobin is low and would like the medication above

## 2017-11-27 ENCOUNTER — Ambulatory Visit (INDEPENDENT_AMBULATORY_CARE_PROVIDER_SITE_OTHER): Payer: Medicare Other

## 2017-11-27 ENCOUNTER — Ambulatory Visit (INDEPENDENT_AMBULATORY_CARE_PROVIDER_SITE_OTHER): Payer: Medicare Other | Admitting: Orthopaedic Surgery

## 2017-11-27 ENCOUNTER — Other Ambulatory Visit: Payer: Self-pay | Admitting: Family Medicine

## 2017-11-27 DIAGNOSIS — M25571 Pain in right ankle and joints of right foot: Secondary | ICD-10-CM

## 2017-11-27 MED ORDER — METHYLPREDNISOLONE ACETATE 40 MG/ML IJ SUSP
13.3300 mg | INTRAMUSCULAR | Status: AC | PRN
  Administered 2017-11-27: 13.33 mg via INTRA_ARTICULAR

## 2017-11-27 MED ORDER — BUPIVACAINE HCL 0.25 % IJ SOLN
0.6600 mL | INTRAMUSCULAR | Status: AC | PRN
  Administered 2017-11-27: .66 mL via INTRA_ARTICULAR

## 2017-11-27 MED ORDER — LIDOCAINE HCL 1 % IJ SOLN
1.0000 mL | INTRAMUSCULAR | Status: AC | PRN
  Administered 2017-11-27: 1 mL

## 2017-11-27 NOTE — Telephone Encounter (Signed)
See pt email from 6/3.
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
## 2017-11-28 ENCOUNTER — Other Ambulatory Visit: Payer: Self-pay | Admitting: Family Medicine

## 2017-12-06 ENCOUNTER — Other Ambulatory Visit: Payer: Medicare Other

## 2017-12-06 ENCOUNTER — Other Ambulatory Visit: Payer: Self-pay | Admitting: Family Medicine

## 2017-12-06 NOTE — Telephone Encounter (Signed)
Pt states has not received at her pharmacy the iron med that provider was going to sent to [id number] - [place], [place], pt states to please send meds ASAP. Please advise.

## 2017-12-07 NOTE — Telephone Encounter (Signed)
Requested medications already refilled on 6/6.

## 2017-12-13 ENCOUNTER — Encounter: Payer: Self-pay | Admitting: Family Medicine

## 2017-12-24 ENCOUNTER — Other Ambulatory Visit: Payer: Medicare Other

## 2018-03-14 ENCOUNTER — Other Ambulatory Visit: Payer: Self-pay | Admitting: Family Medicine

## 2018-03-28 ENCOUNTER — Other Ambulatory Visit: Payer: Self-pay | Admitting: Family Medicine

## 2018-04-18 ENCOUNTER — Other Ambulatory Visit: Payer: Self-pay | Admitting: Family Medicine

## 2018-04-30 ENCOUNTER — Ambulatory Visit (INDEPENDENT_AMBULATORY_CARE_PROVIDER_SITE_OTHER): Payer: Medicare Other | Admitting: Family Medicine

## 2018-04-30 ENCOUNTER — Encounter: Payer: Self-pay | Admitting: Family Medicine

## 2018-04-30 ENCOUNTER — Other Ambulatory Visit (HOSPITAL_COMMUNITY)
Admission: RE | Admit: 2018-04-30 | Discharge: 2018-04-30 | Disposition: A | Discharge status: Home / Self Care | Payer: Medicare Other | Source: Ambulatory Visit | Attending: Family Medicine | Admitting: Family Medicine

## 2018-04-30 DIAGNOSIS — F3110 Bipolar disorder, current episode manic without psychotic features, unspecified: Secondary | ICD-10-CM | POA: Diagnosis not present

## 2018-04-30 DIAGNOSIS — Z720 Tobacco use: Secondary | ICD-10-CM | POA: Diagnosis not present

## 2018-04-30 DIAGNOSIS — E782 Mixed hyperlipidemia: Secondary | ICD-10-CM

## 2018-04-30 DIAGNOSIS — D649 Anemia, unspecified: Secondary | ICD-10-CM

## 2018-04-30 DIAGNOSIS — R739 Hyperglycemia, unspecified: Secondary | ICD-10-CM | POA: Diagnosis not present

## 2018-04-30 DIAGNOSIS — E6609 Other obesity due to excess calories: Secondary | ICD-10-CM | POA: Diagnosis not present

## 2018-04-30 DIAGNOSIS — B354 Tinea corporis: Secondary | ICD-10-CM

## 2018-04-30 DIAGNOSIS — Z124 Encounter for screening for malignant neoplasm of cervix: Secondary | ICD-10-CM | POA: Insufficient documentation

## 2018-04-30 DIAGNOSIS — R32 Unspecified urinary incontinence: Secondary | ICD-10-CM

## 2018-04-30 LAB — TSH: TSH: 1.89 u[IU]/mL (ref 0.35–4.50)

## 2018-04-30 LAB — COMPREHENSIVE METABOLIC PANEL
ALK PHOS: 94 U/L (ref 39–117)
ALT: 9 U/L (ref 0–35)
AST: 13 U/L (ref 0–37)
Albumin: 4.2 g/dL (ref 3.5–5.2)
BILIRUBIN TOTAL: 0.3 mg/dL (ref 0.2–1.2)
BUN: 9 mg/dL (ref 6–23)
CO2: 26 mEq/L (ref 19–32)
CREATININE: 0.76 mg/dL (ref 0.40–1.20)
Calcium: 8.9 mg/dL (ref 8.4–10.5)
Chloride: 105 mEq/L (ref 96–112)
GFR: 89.77 mL/min (ref >60.00)
Glucose, Bld: 105 mg/dL — ABNORMAL HIGH (ref 70–99)
Potassium: 4.2 mEq/L (ref 3.5–5.1)
SODIUM: 138 meq/L (ref 135–145)
TOTAL PROTEIN: 6.6 g/dL (ref 6.0–8.3)

## 2018-04-30 LAB — CBC
HCT: 36.9 % (ref 36.0–46.0)
Hemoglobin: 12.5 g/dL (ref 12.0–15.0)
MCHC: 34 g/dL (ref 30.0–36.0)
MCV: 89 fl (ref 78.0–100.0)
PLATELETS: 306 10*3/uL (ref 150.0–400.0)
RBC: 4.14 Mil/uL (ref 3.87–5.11)
RDW: 12.9 % (ref 11.5–15.5)
WBC: 8.6 10*3/uL (ref 4.0–10.5)

## 2018-04-30 LAB — LIPID PANEL
CHOLESTEROL: 237 mg/dL — AB (ref 0–200)
HDL: 54.8 mg/dL (ref >39.00)
LDL Cholesterol: 159 mg/dL — ABNORMAL HIGH (ref 0–99)
NonHDL: 182.54
Total CHOL/HDL Ratio: 4
Triglycerides: 119 mg/dL (ref 0.0–149.0)
VLDL: 23.8 mg/dL (ref 0.0–40.0)

## 2018-04-30 LAB — HEMOGLOBIN A1C: Hgb A1c MFr Bld: 5.4 % (ref 4.6–6.5)

## 2018-04-30 MED ORDER — ALBUTEROL SULFATE HFA 108 (90 BASE) MCG/ACT IN AERS: INHALATION_SPRAY | RESPIRATORY_TRACT | 1 refills | Status: DC

## 2018-04-30 MED ORDER — TIZANIDINE HCL 4 MG PO TABS: 4.0000 mg | ORAL_TABLET | Freq: Four times a day (QID) | ORAL | 0 refills | Status: DC | PRN

## 2018-04-30 MED ORDER — CLOTRIMAZOLE 1 % EX CREA: 1.0000 "application " | TOPICAL_CREAM | Freq: Two times a day (BID) | CUTANEOUS | 2 refills | Status: DC

## 2018-04-30 NOTE — Assessment & Plan Note (Signed)
Encouraged DASH diet, decrease po intake and increase exercise as tolerated. Needs 7-8 hours of sleep nightly. Avoid trans fats, eat small, frequent meals every 4-5 hours with lean proteins, complex carbs and healthy fats. Minimize simple carbs. Check Insulin level
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
## 2018-04-30 NOTE — Assessment & Plan Note (Signed)
Encouraged heart healthy diet, increase exercise, avoid trans fats, consider a krill oil cap daily 

## 2018-04-30 NOTE — Patient Instructions (Signed)

## 2018-04-30 NOTE — Assessment & Plan Note (Addendum)
Pap today, no concerns on exam.  

## 2018-04-30 NOTE — Assessment & Plan Note (Signed)
Clotrimazole bid
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
## 2018-04-30 NOTE — Assessment & Plan Note (Signed)
hgba1c acceptable, minimize simple carbs. Increase exercise as tolerated. Continue current meds. Insulin level today and a1c today

## 2018-04-30 NOTE — Assessment & Plan Note (Signed)
Encouraged to do kegel's sets of 10 twice daily

## 2018-04-30 NOTE — Assessment & Plan Note (Signed)
Back up to 1 ppd

## 2018-04-30 NOTE — Assessment & Plan Note (Signed)
Increase leafy greens, consider increased lean red meat and using cast iron cookware. Continue to monitor, report any concerns 

## 2018-05-01 LAB — CYTOLOGY - PAP: DIAGNOSIS: NEGATIVE

## 2018-05-03 LAB — INSULIN-LIKE GROWTH FACTOR
IGF-I, LC/MS: 52 ng/mL — ABNORMAL LOW (ref 53–331)
Z-Score (Female): -2 SD (ref ?–2.0)

## 2018-05-03 LAB — CARBAMAZEPINE LEVEL, TOTAL: Carbamazepine Lvl: 7 mg/L (ref 4.0–12.0)

## 2018-05-08 ENCOUNTER — Other Ambulatory Visit: Payer: Self-pay | Admitting: Family Medicine

## 2018-05-15 ENCOUNTER — Other Ambulatory Visit: Payer: Self-pay | Admitting: Family Medicine

## 2018-05-22 ENCOUNTER — Other Ambulatory Visit: Payer: Self-pay | Admitting: Family Medicine

## 2018-05-27 ENCOUNTER — Other Ambulatory Visit: Payer: Self-pay | Admitting: Family Medicine

## 2018-06-27 ENCOUNTER — Other Ambulatory Visit: Payer: Self-pay | Admitting: Family Medicine

## 2018-07-12 ENCOUNTER — Other Ambulatory Visit: Payer: Self-pay | Admitting: Family Medicine

## 2018-09-10 ENCOUNTER — Other Ambulatory Visit: Payer: Self-pay | Admitting: Family Medicine

## 2018-09-30 ENCOUNTER — Other Ambulatory Visit: Payer: Self-pay | Admitting: Family Medicine

## 2018-10-09 ENCOUNTER — Other Ambulatory Visit: Payer: Self-pay | Admitting: Family Medicine

## 2018-10-20 ENCOUNTER — Other Ambulatory Visit: Payer: Self-pay | Admitting: Family Medicine

## 2018-10-21 ENCOUNTER — Other Ambulatory Visit: Payer: Self-pay | Admitting: Family Medicine

## 2018-10-22 ENCOUNTER — Other Ambulatory Visit: Payer: Self-pay | Admitting: Family Medicine

## 2018-10-31 ENCOUNTER — Encounter: Payer: Medicare Other | Admitting: Family Medicine

## 2018-10-31 ENCOUNTER — Telehealth: Payer: Medicare Other | Admitting: Family Medicine

## 2018-10-31 DIAGNOSIS — E782 Mixed hyperlipidemia: Secondary | ICD-10-CM

## 2018-10-31 DIAGNOSIS — R519 Headache, unspecified: Secondary | ICD-10-CM | POA: Insufficient documentation

## 2018-10-31 DIAGNOSIS — Z7189 Other specified counseling: Secondary | ICD-10-CM | POA: Insufficient documentation

## 2018-10-31 DIAGNOSIS — T7840XD Allergy, unspecified, subsequent encounter: Secondary | ICD-10-CM

## 2018-10-31 DIAGNOSIS — F418 Other specified anxiety disorders: Secondary | ICD-10-CM

## 2018-10-31 DIAGNOSIS — Z720 Tobacco use: Secondary | ICD-10-CM

## 2018-10-31 DIAGNOSIS — R51 Headache: Secondary | ICD-10-CM

## 2018-10-31 DIAGNOSIS — T7840XA Allergy, unspecified, initial encounter: Secondary | ICD-10-CM | POA: Insufficient documentation

## 2018-10-31 DIAGNOSIS — F3110 Bipolar disorder, current episode manic without psychotic features, unspecified: Secondary | ICD-10-CM

## 2018-10-31 DIAGNOSIS — R739 Hyperglycemia, unspecified: Secondary | ICD-10-CM

## 2018-10-31 DIAGNOSIS — Z1239 Encounter for other screening for malignant neoplasm of breast: Secondary | ICD-10-CM

## 2018-10-31 MED ORDER — NICOTINE 21 MG/24HR TD PT24: 21.0000 mg | MEDICATED_PATCH | Freq: Every day | TRANSDERMAL | 1 refills | Status: DC

## 2018-10-31 MED ORDER — MONTELUKAST SODIUM 10 MG PO TABS: 10.0000 mg | ORAL_TABLET | Freq: Every evening | ORAL | 1 refills | Status: DC | PRN

## 2018-10-31 MED ORDER — TIZANIDINE HCL 4 MG PO TABS: 4.0000 mg | ORAL_TABLET | Freq: Four times a day (QID) | ORAL | 0 refills | Status: DC | PRN

## 2018-10-31 MED ORDER — CETIRIZINE HCL 10 MG PO TABS: 10.0000 mg | ORAL_TABLET | Freq: Every day | ORAL | 5 refills | Status: DC

## 2018-10-31 MED ORDER — NICOTINE 14 MG/24HR TD PT24: 14.0000 mg | MEDICATED_PATCH | Freq: Every day | TRANSDERMAL | 1 refills | Status: DC

## 2018-10-31 MED ORDER — FLUTICASONE PROPIONATE 50 MCG/ACT NA SUSP: 2.0000 | Freq: Every day | NASAL | 1 refills | Status: DC | PRN

## 2018-10-31 MED ORDER — NICOTINE 7 MG/24HR TD PT24: 7.0000 mg | MEDICATED_PATCH | Freq: Every day | TRANSDERMAL | 1 refills | Status: DC

## 2018-10-31 NOTE — Assessment & Plan Note (Signed)
Ordered for July

## 2018-10-31 NOTE — Assessment & Plan Note (Addendum)
Recently lost her 40 year old dog, her kids are home, broken tooth had to be surgically removed so she has been very stressed. Overall she is managing with current meds no changes. The Seroquel is helping her sleep and stabilizing her moods.
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
## 2018-10-31 NOTE — Assessment & Plan Note (Signed)
Doing well on Seroquel

## 2018-10-31 NOTE — Assessment & Plan Note (Signed)
Encouraged heart healthy diet, increase exercise, avoid trans fats, consider a krill oil cap daily 

## 2018-10-31 NOTE — Assessment & Plan Note (Signed)
Encouraged to continue to quarantine, masking, hand washing, good self care, vitamin C and zinc and sodium bicarb 1/4 tsp 6 x a day only if symptoms develop

## 2018-10-31 NOTE — Assessment & Plan Note (Signed)
hgba1c acceptable, minimize simple carbs. Increase exercise as tolerated.  

## 2018-10-31 NOTE — Assessment & Plan Note (Signed)
Refills given Zyrtec, Singulair, Flonase

## 2018-10-31 NOTE — Assessment & Plan Note (Addendum)
Is encouraged to stop completely due to COVID, given patches to help her quit

## 2018-10-31 NOTE — Assessment & Plan Note (Signed)
Woke up with HA this am but it is gone  Now with Advil and rehydrating. Was over worked yesterday and got dehydrated.

## 2018-10-31 NOTE — Patient Instructions (Signed)
Ganglion Cyst    A ganglion cyst is a non-cancerous, fluid-filled lump that occurs near a joint or tendon. The cyst grows out of a joint or the lining of a tendon. Ganglion cysts most often develop in the hand or wrist, but they can also develop in the shoulder, elbow, hip, knee, ankle, or foot.  Ganglion cysts are ball-shaped or egg-shaped. Their size can range from the size of a pea to larger than a grape. Increased activity may cause the cyst to get bigger because more fluid starts to build up.  What are the causes?  The exact cause of this condition is not known, but it may be related to:   Inflammation or irritation around the joint.   An injury.   Repetitive movements or overuse.   Arthritis.  What increases the risk?  You are more likely to develop this condition if:   You are a woman.   You are 15-40 years old.  What are the signs or symptoms?  The main symptom of this condition is a lump. It most often appears on the hand or wrist. In many cases, there are no other symptoms, but a cyst can sometimes cause:   Tingling.   Pain.   Numbness.   Muscle weakness.   Weak grip.   Less range of motion in a joint.  How is this diagnosed?  Ganglion cysts are usually diagnosed based on a physical exam. Your health care provider will feel the lump and may shine a light next to it. If it is a ganglion cyst, the light will likely shine through it.  Your health care provider may order an X-ray, ultrasound, or MRI to rule out other conditions.  How is this treated?  Ganglion cysts often go away on their own without treatment. If you have pain or other symptoms, treatment may be needed. Treatment is also needed if the ganglion cyst limits your movement or if it gets infected. Treatment may include:   Wearing a brace or splint on your wrist or finger.   Taking anti-inflammatory medicine.   Having fluid drained from the lump with a needle (aspiration).   Getting a steroid injected into the joint.   Having  surgery to remove the ganglion cyst.   Placing a pad on your shoe or wearing shoes that will not rub against the cyst if it is on your foot.  Follow these instructions at home:   Do not press on the ganglion cyst, poke it with a needle, or hit it.   Take over-the-counter and prescription medicines only as told by your health care provider.   If you have a brace or splint:  ? Wear it as told by your health care provider.  ? Remove it as told by your health care provider. Ask if you need to remove it when you take a shower or a bath.   Watch your ganglion cyst for any changes.   Keep all follow-up visits as told by your health care provider. This is important.  Contact a health care provider if:   Your ganglion cyst becomes larger or more painful.   You have pus coming from the lump.   You have weakness or numbness in the affected area.   You have a fever or chills.  Get help right away if:   You have a fever and have any of these in the cyst area:  ? Increased redness.  ? Red streaks.  ? Swelling.  Summary     A ganglion cyst is a non-cancerous, fluid-filled lump that occurs near a joint or tendon.   Ganglion cysts most often develop in the hand or wrist, but they can also develop in the shoulder, elbow, hip, knee, ankle, or foot.   Ganglion cysts often go away on their own without treatment.  This information is not intended to replace advice given to you by your health care provider. Make sure you discuss any questions you have with your health care provider.  Document Released: 06/09/2000 Document Revised: 02/09/2017 Document Reviewed: 02/09/2017  Elsevier Interactive Patient Education  2019 [employer]

## 2018-11-12 ENCOUNTER — Other Ambulatory Visit: Payer: Self-pay | Admitting: Family Medicine

## 2018-11-19 ENCOUNTER — Other Ambulatory Visit: Payer: Self-pay | Admitting: Family Medicine

## 2018-11-25 ENCOUNTER — Other Ambulatory Visit: Payer: Self-pay | Admitting: Family Medicine

## 2018-12-04 ENCOUNTER — Other Ambulatory Visit: Payer: Self-pay | Admitting: Family Medicine

## 2019-01-14 ENCOUNTER — Other Ambulatory Visit: Payer: Self-pay | Admitting: Family Medicine

## 2019-01-31 ENCOUNTER — Other Ambulatory Visit: Payer: Self-pay

## 2019-01-31 ENCOUNTER — Ambulatory Visit (INDEPENDENT_AMBULATORY_CARE_PROVIDER_SITE_OTHER): Payer: Medicare Other | Admitting: Family Medicine

## 2019-01-31 ENCOUNTER — Telehealth: Payer: Self-pay

## 2019-01-31 VITALS — BP 126/98 | HR 87 | Temp 97.3°F | Resp 18 | Wt 220.0 lb

## 2019-01-31 DIAGNOSIS — E6609 Other obesity due to excess calories: Secondary | ICD-10-CM

## 2019-01-31 DIAGNOSIS — F3110 Bipolar disorder, current episode manic without psychotic features, unspecified: Secondary | ICD-10-CM

## 2019-01-31 DIAGNOSIS — E782 Mixed hyperlipidemia: Secondary | ICD-10-CM | POA: Diagnosis not present

## 2019-01-31 DIAGNOSIS — D649 Anemia, unspecified: Secondary | ICD-10-CM | POA: Diagnosis not present

## 2019-01-31 DIAGNOSIS — R739 Hyperglycemia, unspecified: Secondary | ICD-10-CM

## 2019-01-31 DIAGNOSIS — Z720 Tobacco use: Secondary | ICD-10-CM

## 2019-01-31 MED ORDER — TIZANIDINE HCL 4 MG PO TABS: 4.0000 mg | ORAL_TABLET | Freq: Four times a day (QID) | ORAL | 3 refills | Status: DC | PRN

## 2019-01-31 NOTE — Telephone Encounter (Signed)
Updated patients chart °

## 2019-01-31 NOTE — Telephone Encounter (Signed)
Copied from [place] [id number]. Topic: General - Other >> Jan 31, 2019 10:15 AM Virl Axe D wrote: Reason for CRM: 126/98 BP Pulse 87 Resp 18 Weight 220 Temp 97.3

## 2019-02-02 NOTE — Assessment & Plan Note (Signed)
She is doing ell at the current time. No recent change in medications

## 2019-02-02 NOTE — Assessment & Plan Note (Signed)
hgba1c acceptable, minimize simple carbs. Increase exercise as tolerated.  
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
## 2019-02-02 NOTE — Assessment & Plan Note (Signed)
Encouraged DASH diet, decrease po intake and increase exercise as tolerated. Needs 7-8 hours of sleep nightly. Avoid trans fats, eat small, frequent meals every 4-5 hours with lean proteins, complex carbs and healthy fats. Minimize simple carbs 

## 2019-02-02 NOTE — Assessment & Plan Note (Signed)
Down to less than 1/2 ppd with patches, encouraged to continue trying to achieve complete cessation.

## 2019-02-02 NOTE — Assessment & Plan Note (Signed)
Encouraged heart healthy diet, increase exercise, avoid trans fats, consider a krill oil cap daily 

## 2019-02-25 ENCOUNTER — Encounter: Payer: Self-pay | Admitting: Family

## 2019-02-25 ENCOUNTER — Telehealth: Payer: Self-pay | Admitting: Family Medicine

## 2019-02-25 ENCOUNTER — Other Ambulatory Visit: Payer: Self-pay

## 2019-02-25 ENCOUNTER — Other Ambulatory Visit (INDEPENDENT_AMBULATORY_CARE_PROVIDER_SITE_OTHER): Payer: Medicare Other

## 2019-02-25 ENCOUNTER — Ambulatory Visit (INDEPENDENT_AMBULATORY_CARE_PROVIDER_SITE_OTHER): Payer: Medicare Other | Admitting: Family

## 2019-02-25 VITALS — BP 156/94 | Temp 97.7°F | Resp 16 | Ht 67.0 in | Wt 220.0 lb

## 2019-02-25 DIAGNOSIS — E782 Mixed hyperlipidemia: Secondary | ICD-10-CM | POA: Diagnosis not present

## 2019-02-25 DIAGNOSIS — D649 Anemia, unspecified: Secondary | ICD-10-CM | POA: Diagnosis not present

## 2019-02-25 DIAGNOSIS — K649 Unspecified hemorrhoids: Secondary | ICD-10-CM

## 2019-02-25 DIAGNOSIS — R739 Hyperglycemia, unspecified: Secondary | ICD-10-CM | POA: Diagnosis not present

## 2019-02-25 LAB — CBC
HCT: 38.9 % (ref 36.0–46.0)
Hemoglobin: 12.7 g/dL (ref 12.0–15.0)
MCHC: 32.7 g/dL (ref 30.0–36.0)
MCV: 89.6 fl (ref 78.0–100.0)
Platelets: 325 10*3/uL (ref 150.0–400.0)
RBC: 4.35 Mil/uL (ref 3.87–5.11)
RDW: 14.3 % (ref 11.5–15.5)
WBC: 12.7 10*3/uL — ABNORMAL HIGH (ref 4.0–10.5)

## 2019-02-25 LAB — COMPREHENSIVE METABOLIC PANEL
ALT: 15 U/L (ref 0–35)
AST: 17 U/L (ref 0–37)
Albumin: 4.2 g/dL (ref 3.5–5.2)
Alkaline Phosphatase: 95 U/L (ref 39–117)
BUN: 12 mg/dL (ref 6–23)
CO2: 24 mEq/L (ref 19–32)
Calcium: 9.3 mg/dL (ref 8.4–10.5)
Chloride: 106 mEq/L (ref 96–112)
Creatinine, Ser: 0.76 mg/dL (ref 0.40–1.20)
GFR: 84.11 mL/min (ref >60.00)
Glucose, Bld: 102 mg/dL — ABNORMAL HIGH (ref 70–99)
Potassium: 4.3 mEq/L (ref 3.5–5.1)
Sodium: 139 mEq/L (ref 135–145)
Total Bilirubin: 0.2 mg/dL (ref 0.2–1.2)
Total Protein: 7 g/dL (ref 6.0–8.3)

## 2019-02-25 LAB — LIPID PANEL
Cholesterol: 207 mg/dL — ABNORMAL HIGH (ref 0–200)
HDL: 57.2 mg/dL (ref >39.00)
LDL Cholesterol: 133 mg/dL — ABNORMAL HIGH (ref 0–99)
NonHDL: 150.15
Total CHOL/HDL Ratio: 4
Triglycerides: 86 mg/dL (ref 0.0–149.0)
VLDL: 17.2 mg/dL (ref 0.0–40.0)

## 2019-02-25 LAB — TSH: TSH: 2.35 u[IU]/mL (ref 0.35–4.50)

## 2019-02-25 LAB — HEMOGLOBIN A1C: Hgb A1c MFr Bld: 5.8 % (ref 4.6–6.5)

## 2019-02-25 MED ORDER — LIDOCAINE 5 % EX OINT: 1.0000 "application " | TOPICAL_OINTMENT | Freq: Two times a day (BID) | CUTANEOUS | 0 refills | Status: DC | PRN

## 2019-02-25 MED ORDER — HYDROCORTISONE 1 % EX CREA: 1.0000 "application " | TOPICAL_CREAM | Freq: Two times a day (BID) | CUTANEOUS | 0 refills | Status: DC

## 2019-02-25 MED ORDER — HYDROCORT-PRAMOXINE (PERIANAL) 1-1 % EX FOAM: 1.0000 | Freq: Two times a day (BID) | CUTANEOUS | 1 refills | Status: DC

## 2019-02-25 NOTE — Telephone Encounter (Signed)
Patient advised of new prescriptions the protofoam was over 150$ cash. Alternative lidocaine cream and Hydrocortisone cream will be about 75 $ (per pharmacist) patient has been advised if she can not purchase either one she can continue otc.

## 2019-02-25 NOTE — Telephone Encounter (Signed)
Please call pt and let her know that I sent topical hydrocortisone and separate topical lidocaine which she can apply externally as needed.  If these are expensive or not covered by her insurance then I would recommend that she continue with her OTC preparations as well as the epsom salt tub soaks/stool softener.  Can also use tylenol/motrin as needed for pain.

## 2019-02-25 NOTE — Patient Instructions (Signed)
Add colace one twice daily. Continue high fiber diet. Continue plenty of water intake. Soak twice daily in a warm tub sprinkled with epsom salt .
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
## 2019-02-25 NOTE — Telephone Encounter (Signed)
Pharmacy called stating the medication, hydrocortisone-pramoxine (PROCTOFOAM-HC) rectal foam, is not covered by pts insurance. Please advise.    [id number] - [place], [place] - [address]  [place] [address]  Phone: [phone] Fax: [phone]  Not a 24 hour pharmacy; exact hours not known.

## 2019-02-28 ENCOUNTER — Other Ambulatory Visit: Payer: Self-pay | Admitting: Family Medicine

## 2019-02-28 ENCOUNTER — Encounter: Payer: Self-pay | Admitting: Family Medicine

## 2019-02-28 MED ORDER — HYDROCORTISONE ACETATE 25 MG RE SUPP: 25.0000 mg | Freq: Two times a day (BID) | RECTAL | 1 refills | Status: DC

## 2019-02-28 NOTE — Telephone Encounter (Signed)
Spoke with patient she stated she was in sever pain. She was advised by Lenna Sciara and Dr. Larose Kells that she needed to go to the ER and or pick up the medication Melissa prescribed to her on 02/25/2019. Speaking on the phone she was sobbing and screaming at me that she needed relief and that she needed to see GI sooner than the 18th I tried to tell her I would call their office on Tuesday when we returned and she was not happy with that response. She continued to yell stating that we are not trying to help her and that she needed to be seen. She was advised several times that it was the end of the day and a 3 day weekend and there where no appointments available today she could call Saturday clinic or come downstairs or go to an Urgent care. She did not want to do either.  She stated she will try to get the money to get the medication and sees if that will help she stated if she spends $150.00 on the medication and it does not work she was going to be upset at [place].

## 2019-03-04 ENCOUNTER — Other Ambulatory Visit: Payer: Self-pay

## 2019-03-04 ENCOUNTER — Encounter (HOSPITAL_BASED_OUTPATIENT_CLINIC_OR_DEPARTMENT_OTHER): Payer: Self-pay | Admitting: *Deleted

## 2019-03-04 ENCOUNTER — Emergency Department (HOSPITAL_BASED_OUTPATIENT_CLINIC_OR_DEPARTMENT_OTHER)
Admission: EM | Admit: 2019-03-04 | Discharge: 2019-03-04 | Disposition: A | Discharge status: Home / Self Care | Payer: Medicare Other | Attending: Emergency Medicine | Admitting: Emergency Medicine

## 2019-03-04 DIAGNOSIS — K649 Unspecified hemorrhoids: Secondary | ICD-10-CM | POA: Insufficient documentation

## 2019-03-04 DIAGNOSIS — Z79899 Other long term (current) drug therapy: Secondary | ICD-10-CM | POA: Diagnosis not present

## 2019-03-04 DIAGNOSIS — K644 Residual hemorrhoidal skin tags: Secondary | ICD-10-CM

## 2019-03-04 DIAGNOSIS — F17210 Nicotine dependence, cigarettes, uncomplicated: Secondary | ICD-10-CM | POA: Insufficient documentation

## 2019-03-04 DIAGNOSIS — K6289 Other specified diseases of anus and rectum: Secondary | ICD-10-CM | POA: Diagnosis present

## 2019-03-04 NOTE — Discharge Instructions (Signed)
Start using the MiraLAX to see if that helps with your constipation.  Do 1 capful in 8 ounces of water for 3 to 4 days in a row but if that does not relieve your constipation increase it to 2 capfuls.  Also the prescription steroid creams that you were given only use those for up to 1 week and then stop for at least a week to avoid side effects.  Use the nitroglycerin ointment twice a day for pain.  Make sure you clean the area first and then put the ointment on the area where it is painful.  You can use that up to 4 weeks.  Continue doing the soaks we can also take Tylenol and ibuprofen for pain.

## 2019-03-04 NOTE — ED Triage Notes (Signed)
Rectal pain due to hemorrhoids.  Severe pain while seating down started  3 weeks ago.
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
## 2019-03-04 NOTE — ED Notes (Signed)
ED Provider at bedside. 

## 2019-03-14 ENCOUNTER — Ambulatory Visit: Payer: Medicare Other | Admitting: Gastroenterology

## 2019-03-25 ENCOUNTER — Ambulatory Visit (INDEPENDENT_AMBULATORY_CARE_PROVIDER_SITE_OTHER): Payer: Medicare Other | Admitting: Gastroenterology

## 2019-03-25 ENCOUNTER — Encounter: Payer: Self-pay | Admitting: Gastroenterology

## 2019-03-25 VITALS — BP 132/86 | HR 87 | Temp 97.8°F | Ht 67.0 in | Wt 220.0 lb

## 2019-03-25 DIAGNOSIS — K5900 Constipation, unspecified: Secondary | ICD-10-CM | POA: Diagnosis not present

## 2019-03-25 DIAGNOSIS — K625 Hemorrhage of anus and rectum: Secondary | ICD-10-CM | POA: Diagnosis not present

## 2019-03-25 DIAGNOSIS — K6289 Other specified diseases of anus and rectum: Secondary | ICD-10-CM | POA: Diagnosis not present

## 2019-03-25 DIAGNOSIS — K629 Disease of anus and rectum, unspecified: Secondary | ICD-10-CM

## 2019-03-25 DIAGNOSIS — K602 Anal fissure, unspecified: Secondary | ICD-10-CM

## 2019-03-25 NOTE — Patient Instructions (Addendum)
If you are age 40 or older, your body mass index should be between 23-30. Your Body mass index is 34.46 kg/m. If this is out of the aforementioned range listed, please consider follow up with your Primary Care Provider.  If you are age 44 or younger, your body mass index should be between 19-25. Your Body mass index is 34.46 kg/m. If this is out of the aformentioned range listed, please consider follow up with your Primary Care Provider.   Continue Nitro Gel for 6-8 weeks total.  Use Miralax daily.  Drink fluids and eat fiber, fruits, and veggies.  You are being referred to [facility].  They will contact you with an appointment.  Thank you for choosing me and [facility].   Alonza Bogus, PA-C
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
## 2019-03-26 ENCOUNTER — Other Ambulatory Visit: Payer: Self-pay | Admitting: Family Medicine

## 2019-04-04 NOTE — Progress Notes (Signed)
Reviewed and agree with documentation and assessment and plan. K. Veena Patrick Sohm , MD   

## 2019-04-12 ENCOUNTER — Other Ambulatory Visit: Payer: Self-pay | Admitting: Family Medicine

## 2019-04-25 ENCOUNTER — Other Ambulatory Visit: Payer: Self-pay | Admitting: Family Medicine

## 2019-05-29 ENCOUNTER — Other Ambulatory Visit: Payer: Self-pay | Admitting: Family Medicine

## 2019-06-04 ENCOUNTER — Ambulatory Visit
Admission: RE | Admit: 2019-06-04 | Discharge: 2019-06-04 | Disposition: A | Discharge status: Home / Self Care | Payer: Medicare Other | Source: Ambulatory Visit | Attending: Family Medicine | Admitting: Family Medicine

## 2019-06-04 ENCOUNTER — Other Ambulatory Visit: Payer: Self-pay

## 2019-06-04 DIAGNOSIS — Z1239 Encounter for other screening for malignant neoplasm of breast: Secondary | ICD-10-CM

## 2019-06-26 ENCOUNTER — Other Ambulatory Visit: Payer: Self-pay | Admitting: Family Medicine
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
## 2019-08-08 ENCOUNTER — Other Ambulatory Visit: Payer: Self-pay

## 2019-08-08 ENCOUNTER — Ambulatory Visit (INDEPENDENT_AMBULATORY_CARE_PROVIDER_SITE_OTHER): Payer: Medicare Other | Admitting: *Deleted

## 2019-08-08 ENCOUNTER — Encounter: Payer: Self-pay | Admitting: *Deleted

## 2019-08-08 DIAGNOSIS — Z0000 Encounter for general adult medical examination without abnormal findings: Secondary | ICD-10-CM

## 2019-08-08 NOTE — Patient Instructions (Signed)
See you next year!  Continue to eat heart healthy diet (full of fruits, vegetables, whole grains, lean protein, water--limit salt, fat, and sugar intake) and increase physical activity as tolerated.  Continue doing brain stimulating activities (puzzles, reading, adult coloring books, staying active) to keep memory sharp.   Bring a copy of your living will and/or healthcare power of attorney to your next office visit.   Phyllis Washington , Thank you for taking time to come for your Medicare Wellness Visit. I appreciate your ongoing commitment to your health goals. Please review the following plan we discussed and let me know if I can assist you in the future.   These are the goals we discussed: Goals    . DIET - INCREASE WATER INTAKE    . Increase physical activity     5 minute rule 3x/ wk    . Weight (lb) < 200 lb (90.7 kg)     Lose weight with diet and exercise.       This is a list of the screening recommended for you and due dates:  Health Maintenance  Topic Date Due  . Pap Smear  04/30/2021  . Tetanus Vaccine  04/23/2023  . HIV Screening  Completed  . Flu Shot  Discontinued    Coping with Quitting Smoking  Quitting smoking is a physical and mental challenge. You will face cravings, withdrawal symptoms, and temptation. Before quitting, work with your health care provider to make a plan that can help you cope. Preparation can help you quit and keep you from giving in. How can I cope with cravings? Cravings usually last for 5-10 minutes. If you get through it, the craving will pass. Consider taking the following actions to help you cope with cravings:  Keep your mouth busy: ? Chew sugar-free gum. ? Suck on hard candies or a straw. ? Brush your teeth.  Keep your hands and body busy: ? Immediately change to a different activity when you feel a craving. ? Squeeze or play with a ball. ? Do an activity or a hobby, like making bead jewelry, practicing needlepoint, or working with  wood. ? Mix up your normal routine. ? Take a short exercise break. Go for a quick walk or run up and down stairs. ? Spend time in public places where smoking is not allowed.  Focus on doing something kind or helpful for someone else.  Call a friend or family member to talk during a craving.  Join a support group.  Call a quit line, such as 1-800-QUIT-NOW.  Talk with your health care provider about medicines that might help you cope with cravings and make quitting easier for you. How can I deal with withdrawal symptoms? Your body may experience negative effects as it tries to get used to not having nicotine in the system. These effects are called withdrawal symptoms. They may include:  Feeling hungrier than normal.  Trouble concentrating.  Irritability.  Trouble sleeping.  Feeling depressed.  Restlessness and agitation.  Craving a cigarette. To manage withdrawal symptoms:  Avoid places, people, and activities that trigger your cravings.  Remember why you want to quit.  Get plenty of sleep.  Avoid coffee and other caffeinated drinks. These may worsen some of your symptoms. How can I handle social situations? Social situations can be difficult when you are quitting smoking, especially in the first few weeks. To manage this, you can:  Avoid parties, bars, and other social situations where people might be smoking.  Avoid alcohol.  Leave right away if you have the urge to smoke.  Explain to your family and friends that you are quitting smoking. Ask for understanding and support.  Plan activities with friends or family where smoking is not an option. What are some ways I can cope with stress? Wanting to smoke may cause stress, and stress can make you want to smoke. Find ways to manage your stress. Relaxation techniques can help. For example:  Breathe slowly and deeply, in through your nose and out through your mouth.  Listen to soothing, relaxing music.  Talk with a  family member or friend about your stress.  Light a candle.  Soak in a bath or take a shower.  Think about a peaceful place. What are some ways I can prevent weight gain? Be aware that many people gain weight after they quit smoking. However, not everyone does. To keep from gaining weight, have a plan in place before you quit and stick to the plan after you quit. Your plan should include:  Having healthy snacks. When you have a craving, it may help to: ? Eat plain popcorn, crunchy carrots, celery, or other cut vegetables. ? Chew sugar-free gum.  Changing how you eat: ? Eat small portion sizes at meals. ? Eat 4-6 small meals throughout the day instead of 1-2 large meals a day. ? Be mindful when you eat. Do not watch television or do other things that might distract you as you eat.  Exercising regularly: ? Make time to exercise each day. If you do not have time for a long workout, do short bouts of exercise for 5-10 minutes several times a day. ? Do some form of strengthening exercise, like weight lifting, and some form of aerobic exercise, like running or swimming.  Drinking plenty of water or other low-calorie or no-calorie drinks. Drink 6-8 glasses of water daily, or as much as instructed by your health care provider. Summary  Quitting smoking is a physical and mental challenge. You will face cravings, withdrawal symptoms, and temptation to smoke again. Preparation can help you as you go through these challenges.  You can cope with cravings by keeping your mouth busy (such as by chewing gum), keeping your body and hands busy, and making calls to family, friends, or a helpline for people who want to quit smoking.  You can cope with withdrawal symptoms by avoiding places where people smoke, avoiding drinks with caffeine, and getting plenty of rest.  Ask your health care provider about the different ways to prevent weight gain, avoid stress, and handle social situations. This  information is not intended to replace advice given to you by your health care provider. Make sure you discuss any questions you have with your health care provider. Document Revised: 05/25/2017 Document Reviewed: 06/09/2016 Elsevier Patient Education  2020 [employer]  Preventive Care 21-39 Years Old, Female Preventive care refers to visits with your health care provider and lifestyle choices that can promote health and wellness. This includes:  A yearly physical exam. This may also be called an annual well check.  Regular dental visits and eye exams.  Immunizations.  Screening for certain conditions.  Healthy lifestyle choices, such as eating a healthy diet, getting regular exercise, not using drugs or products that contain nicotine and tobacco, and limiting alcohol use. What can I expect for my preventive care visit? Physical exam Your health care provider will check your:  Height and weight. This may be used to calculate body mass index (BMI), which   tells if you are at a healthy weight.  Heart rate and blood pressure.  Skin for abnormal spots. Counseling Your health care provider may ask you questions about your:  Alcohol, tobacco, and drug use.  Emotional well-being.  Home and relationship well-being.  Sexual activity.  Eating habits.  Work and work Statistician.  Method of birth control.  Menstrual cycle.  Pregnancy history. What immunizations do I need?  Influenza (flu) vaccine  This is recommended every year. Tetanus, diphtheria, and pertussis (Tdap) vaccine  You may need a Td booster every 10 years. Varicella (chickenpox) vaccine  You may need this if you have not been vaccinated. Human papillomavirus (HPV) vaccine  If recommended by your health care provider, you may need three doses over 6 months. Measles, mumps, and rubella (MMR) vaccine  You may need at least one dose of MMR. You may also need a second dose. Meningococcal conjugate  (MenACWY) vaccine  One dose is recommended if you are age 42-21 years and a first-year college student living in a residence hall, or if you have one of several medical conditions. You may also need additional booster doses. Pneumococcal conjugate (PCV13) vaccine  You may need this if you have certain conditions and were not previously vaccinated. Pneumococcal polysaccharide (PPSV23) vaccine  You may need one or two doses if you smoke cigarettes or if you have certain conditions. Hepatitis A vaccine  You may need this if you have certain conditions or if you travel or work in places where you may be exposed to hepatitis A. Hepatitis B vaccine  You may need this if you have certain conditions or if you travel or work in places where you may be exposed to hepatitis B. Haemophilus influenzae type b (Hib) vaccine  You may need this if you have certain conditions. You may receive vaccines as individual doses or as more than one vaccine together in one shot (combination vaccines). Talk with your health care provider about the risks and benefits of combination vaccines. What tests do I need?  Blood tests  Lipid and cholesterol levels. These may be checked every 5 years starting at age 37.  Hepatitis C test.  Hepatitis B test. Screening  Diabetes screening. This is done by checking your blood sugar (glucose) after you have not eaten for a while (fasting).  Sexually transmitted disease (STD) testing.  BRCA-related cancer screening. This may be done if you have a family history of breast, ovarian, tubal, or peritoneal cancers.  Pelvic exam and Pap test. This may be done every 3 years starting at age 29. Starting at age 33, this may be done every 5 years if you have a Pap test in combination with an HPV test. Talk with your health care provider about your test results, treatment options, and if necessary, the need for more tests. Follow these instructions at home: Eating and  drinking   Eat a diet that includes fresh fruits and vegetables, whole grains, lean protein, and low-fat dairy.  Take vitamin and mineral supplements as recommended by your health care provider.  Do not drink alcohol if: ? Your health care provider tells you not to drink. ? You are pregnant, may be pregnant, or are planning to become pregnant.  If you drink alcohol: ? Limit how much you have to 0-1 drink a day. ? Be aware of how much alcohol is in your drink. In the [place] one drink equals one 12 oz bottle of beer (355 mL), one 5 oz glass of  wine (148 mL), or one 1 oz glass of hard liquor (44 mL). Lifestyle  Take daily care of your teeth and gums.  Stay active. Exercise for at least 30 minutes on 5 or more days each week.  Do not use any products that contain nicotine or tobacco, such as cigarettes, e-cigarettes, and chewing tobacco. If you need help quitting, ask your health care provider.  If you are sexually active, practice safe sex. Use a condom or other form of birth control (contraception) in order to prevent pregnancy and STIs (sexually transmitted infections). If you plan to become pregnant, see your health care provider for a preconception visit. What's next?  Visit your health care provider once a year for a well check visit.  Ask your health care provider how often you should have your eyes and teeth checked.  Stay up to date on all vaccines. This information is not intended to replace advice given to you by your health care provider. Make sure you discuss any questions you have with your health care provider. Document Revised: 02/21/2018 Document Reviewed: 02/21/2018 Elsevier Patient Education  2020 [employer].

## 2019-08-26 ENCOUNTER — Other Ambulatory Visit: Payer: Self-pay | Admitting: Family Medicine

## 2019-09-26 ENCOUNTER — Other Ambulatory Visit: Payer: Self-pay | Admitting: Family Medicine

## 2019-11-06 ENCOUNTER — Encounter: Payer: Self-pay | Admitting: Medical

## 2019-11-06 ENCOUNTER — Ambulatory Visit (INDEPENDENT_AMBULATORY_CARE_PROVIDER_SITE_OTHER): Payer: Medicare Other | Admitting: Medical

## 2019-11-06 ENCOUNTER — Other Ambulatory Visit: Payer: Self-pay

## 2019-11-06 VITALS — BP 144/101 | HR 93 | Resp 18 | Ht 67.0 in | Wt 222.2 lb

## 2019-11-06 DIAGNOSIS — L089 Local infection of the skin and subcutaneous tissue, unspecified: Secondary | ICD-10-CM

## 2019-11-06 DIAGNOSIS — D179 Benign lipomatous neoplasm, unspecified: Secondary | ICD-10-CM

## 2019-11-06 MED ORDER — AMOXICILLIN-POT CLAVULANATE 875-125 MG PO TABS: 1.0000 | ORAL_TABLET | Freq: Two times a day (BID) | ORAL | 0 refills | Status: DC
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
## 2019-11-06 NOTE — Patient Instructions (Signed)
For you area behind rt ear appear possible infected area skin vs sebaceous cyst. Possible lipoma vs small lymph node enlarged as well.  Will rx augmentin antibiotic and advise advise warm compresses twice daily.(provided does not increase pain.)  Will refer to ENT to evaluate area.   Follow up with [place] in 14 days or as needed if delay in ENT referral.

## 2019-11-13 ENCOUNTER — Ambulatory Visit: Payer: Medicare Other | Admitting: Family Medicine

## 2019-12-30 ENCOUNTER — Other Ambulatory Visit: Payer: Self-pay

## 2019-12-30 ENCOUNTER — Ambulatory Visit (INDEPENDENT_AMBULATORY_CARE_PROVIDER_SITE_OTHER): Payer: Medicare Other | Admitting: Family Medicine

## 2019-12-30 VITALS — BP 115/85 | HR 87 | Temp 98.2°F | Resp 12 | Ht 67.0 in | Wt 223.6 lb

## 2019-12-30 DIAGNOSIS — D72829 Elevated white blood cell count, unspecified: Secondary | ICD-10-CM | POA: Diagnosis not present

## 2019-12-30 DIAGNOSIS — E782 Mixed hyperlipidemia: Secondary | ICD-10-CM

## 2019-12-30 DIAGNOSIS — Z720 Tobacco use: Secondary | ICD-10-CM

## 2019-12-30 DIAGNOSIS — R739 Hyperglycemia, unspecified: Secondary | ICD-10-CM

## 2019-12-30 DIAGNOSIS — F3110 Bipolar disorder, current episode manic without psychotic features, unspecified: Secondary | ICD-10-CM

## 2019-12-30 DIAGNOSIS — D649 Anemia, unspecified: Secondary | ICD-10-CM

## 2019-12-30 LAB — CBC
HCT: 36.9 % (ref 36.0–46.0)
Hemoglobin: 12.5 g/dL (ref 12.0–15.0)
MCHC: 33.9 g/dL (ref 30.0–36.0)
MCV: 89.6 fl (ref 78.0–100.0)
Platelets: 280 10*3/uL (ref 150.0–400.0)
RBC: 4.12 Mil/uL (ref 3.87–5.11)
RDW: 14.7 % (ref 11.5–15.5)
WBC: 9.2 10*3/uL (ref 4.0–10.5)

## 2019-12-30 LAB — COMPREHENSIVE METABOLIC PANEL
ALT: 18 U/L (ref 0–35)
AST: 18 U/L (ref 0–37)
Albumin: 4.2 g/dL (ref 3.5–5.2)
Alkaline Phosphatase: 98 U/L (ref 39–117)
BUN: 10 mg/dL (ref 6–23)
CO2: 25 mEq/L (ref 19–32)
Calcium: 8.9 mg/dL (ref 8.4–10.5)
Chloride: 106 mEq/L (ref 96–112)
Creatinine, Ser: 0.72 mg/dL (ref 0.40–1.20)
GFR: 89.15 mL/min (ref >60.00)
Glucose, Bld: 102 mg/dL — ABNORMAL HIGH (ref 70–99)
Potassium: 4.3 mEq/L (ref 3.5–5.1)
Sodium: 140 mEq/L (ref 135–145)
Total Bilirubin: 0.3 mg/dL (ref 0.2–1.2)
Total Protein: 6.5 g/dL (ref 6.0–8.3)

## 2019-12-30 LAB — LIPID PANEL
Cholesterol: 202 mg/dL — ABNORMAL HIGH (ref 0–200)
HDL: 49.3 mg/dL (ref >39.00)
LDL Cholesterol: 130 mg/dL — ABNORMAL HIGH (ref 0–99)
NonHDL: 153.04
Total CHOL/HDL Ratio: 4
Triglycerides: 116 mg/dL (ref 0.0–149.0)
VLDL: 23.2 mg/dL (ref 0.0–40.0)

## 2019-12-30 LAB — HEMOGLOBIN A1C: Hgb A1c MFr Bld: 5.7 % (ref 4.6–6.5)

## 2019-12-30 LAB — TSH: TSH: 2.34 u[IU]/mL (ref 0.35–4.50)

## 2019-12-30 NOTE — Patient Instructions (Signed)
Skin Abscess  A skin abscess is an infected area on or under your skin that contains a collection of pus and other material. An abscess may also be called a furuncle, carbuncle, or boil. An abscess can occur in or on almost any part of your body. Some abscesses break open (rupture) on their own. Most continue to get worse unless they are treated. The infection can spread deeper into the body and eventually into your blood, which can make you feel ill. Treatment usually involves draining the abscess. What are the causes? An abscess occurs when germs, like bacteria, pass through your skin and cause an infection. This may be caused by:  A scrape or cut on your skin.  A puncture wound through your skin, including a needle injection or insect bite.  Blocked oil or sweat glands.  Blocked and infected hair follicles.  A cyst that forms beneath your skin (sebaceous cyst) and becomes infected. What increases the risk? This condition is more likely to develop in people who:  Have a weak body defense system (immune system).  Have diabetes.  Have dry and irritated skin.  Get frequent injections or use illegal IV drugs.  Have a foreign body in a wound, such as a splinter.  Have problems with their lymph system or veins. What are the signs or symptoms? Symptoms of this condition include:  A painful, firm bump under the skin.  A bump with pus at the top. This may break through the skin and drain. Other symptoms include:  Redness surrounding the abscess site.  Warmth.  Swelling of the lymph nodes (glands) near the abscess.  Tenderness.  A sore on the skin. How is this diagnosed? This condition may be diagnosed based on:  A physical exam.  Your medical history.  A sample of pus. This may be used to find out what is causing the infection.  Blood tests.  Imaging tests, such as an ultrasound, CT scan, or MRI. How is this treated? A small abscess that drains on its own may  not need treatment. Treatment for larger abscesses may include:  Moist heat or heat pack applied to the area several times a day.  A procedure to drain the abscess (incision and drainage).  Antibiotic medicines. For a severe abscess, you may first get antibiotics through an IV and then change to antibiotics by mouth. Follow these instructions at home: Medicines   Take over-the-counter and prescription medicines only as told by your health care provider.  If you were prescribed an antibiotic medicine, take it as told by your health care provider. Do not stop taking the antibiotic even if you start to feel better. Abscess care   If you have an abscess that has not drained, apply heat to the affected area. Use the heat source that your health care provider recommends, such as a moist heat pack or a heating pad. ? Place a towel between your skin and the heat source. ? Leave the heat on for 20-30 minutes. ? Remove the heat if your skin turns bright red. This is especially important if you are unable to feel pain, heat, or cold. You may have a greater risk of getting burned.  Follow instructions from your health care provider about how to take care of your abscess. Make sure you: ? Cover the abscess with a bandage (dressing). ? Change your dressing or gauze as told by your health care provider. ? Wash your hands with soap and water before you change the   dressing or gauze. If soap and water are not available, use hand sanitizer.  Check your abscess every day for signs of a worsening infection. Check for: ? More redness, swelling, or pain. ? More fluid or blood. ? Warmth. ? More pus or a bad smell. General instructions  To avoid spreading the infection: ? Do not share personal care items, towels, or hot tubs with others. ? Avoid making skin contact with other people.  Keep all follow-up visits as told by your health care provider. This is important. Contact a health care provider if  you have:  More redness, swelling, or pain around your abscess.  More fluid or blood coming from your abscess.  Warm skin around your abscess.  More pus or a bad smell coming from your abscess.  A fever.  Muscle aches.  Chills or a general ill feeling. Get help right away if you:  Have severe pain.  See red streaks on your skin spreading away from the abscess. Summary  A skin abscess is an infected area on or under your skin that contains a collection of pus and other material.  A small abscess that drains on its own may not need treatment.  Treatment for larger abscesses may include having a procedure to drain the abscess and taking an antibiotic. This information is not intended to replace advice given to you by your health care provider. Make sure you discuss any questions you have with your health care provider. Document Revised: 10/03/2018 Document Reviewed: 07/26/2017 Elsevier Patient Education  2020 [employer] Inc.  

## 2020-01-01 NOTE — Assessment & Plan Note (Signed)
Continues with psychiatry, she reports she is stable.

## 2020-01-01 NOTE — Assessment & Plan Note (Signed)
Encouraged heart healthy diet, increase exercise, avoid trans fats, consider a krill oil cap daily 

## 2020-01-01 NOTE — Assessment & Plan Note (Signed)
Is down to 1/2 ppd. Is continuing to work on quitting.
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
## 2020-01-01 NOTE — Assessment & Plan Note (Signed)
hgba1c acceptable, minimize simple carbs. Increase exercise as tolerated.  

## 2020-01-22 ENCOUNTER — Emergency Department (HOSPITAL_COMMUNITY)
Admission: EM | Admit: 2020-01-22 | Discharge: 2020-01-22 | Discharge status: Against Medical Advice | Payer: Medicare Other

## 2020-01-22 ENCOUNTER — Telehealth: Payer: Self-pay | Admitting: Family Medicine

## 2020-01-22 NOTE — Telephone Encounter (Signed)
Nurse Assessment Nurse: Raphael Gibney, RN, Vanita Ingles Date/Time (Eastern Time): 01/22/2020 9:40:26 AM Confirm and document reason for call. If symptomatic, describe symptoms. ---Caller states she has been vomiting with diarrhea for 2 days. Can not keep down fluids and food. She is cold with periods of sweating. no fever. has been SOB for 2 days. has been using albuterol inhaler. has bug bites since she was in the woods. has urinated. has severe abd pain. Has the patient had close contact with a person known or suspected to have the novel coronavirus illness OR traveled / lives in area with major community spread (including international travel) in the last 14 days from the onset of symptoms? * If Asymptomatic, screen for exposure and travel within the last 14 days. ---No Does the patient have any new or worsening symptoms? ---Yes Will a triage be completed? ---Yes Related visit to physician within the last 2 weeks? ---No Does the PT have any chronic conditions? (i.e. diabetes, asthma, this includes High risk factors for pregnancy, etc.) ---No Is the patient pregnant or possibly pregnant? (Ask all females between the ages of 4-55) ---No Is this a behavioral health or substance abuse call? ---No PLEASE NOTE: All timestamps contained within this report are represented as [place] Standard Time. CONFIDENTIALTY NOTICE: This fax transmission is intended only for the addressee. It contains information that is legally privileged, confidential or otherwise protected from use or disclosure. If you are not the intended recipient, you are strictly prohibited from reviewing, disclosing, copying using or disseminating any of this information or taking any action in reliance on or regarding this information. If you have received this fax in error, please notify us immediately by telephone so that we can arrange for its return to [place]. Phone: [phone], Toll-Free: [phone], Fax: [phone] Page: 2 of 2 Call  Id: [id number] Guidelines Guideline Title Affirmed Question Affirmed Notes Nurse Date/Time Eilene Ghazi Time) COVID-19 - Diagnosed or Suspected Shock suspected (e.g., cold/pale/clammy skin, too weak to stand, low BP, rapid pulse) Raphael Gibney, RN, Vera 01/22/2020 9:44:23 AM Disp. Time Eilene Ghazi Time) Disposition Final User 01/22/2020 9:39:38 AM Send to Urgent Ala Dach 01/22/2020 9:57:27 AM Send To RN Personal Raphael Gibney, RN, Vera 01/22/2020 10:01:14 AM 911 Outcome Documentation Raphael Gibney, RN, Vanita Ingles Reason: attempted 911 outcome documentation call. did not leave message 01/22/2020 9:56:55 AM Call EMS 911 Now Yes Raphael Gibney, RN, Doreatha Lew Disagree/Comply Disagree Caller Understands Yes PreDisposition Call Doctor Care Advice Given Per Guideline CALL EMS 911 NOW: * Immediate medical attention is needed. You need to hang up and call 911 (or an ambulance). TELL THE AMBULANCE DISPATCHER ABOUT COVID-19 DIAGNOSIS: * When you call 911, tell the dispatcher that you probably have COVID-19. Comments User: Dannielle Burn, RN Date/Time Eilene Ghazi Time): 01/22/2020 9:56:38 AM pt states she does not want to call 911 and that her husband will not take her to the ER and he is at work. Spoke to spouse on conference call and he states will take her to the ER Referrals GO TO FACILITY UNDECIDED  Pt at ED.

## 2020-01-22 NOTE — Telephone Encounter (Signed)
New Message:   Pt is calling in and states she has been having vomiting for the last 3 days and diarrhea that is burning. She states she is unable to keep anything down. She also states she is having SOB. I have transferred her to team health. Please advise.

## 2020-01-22 NOTE — ED Triage Notes (Signed)
Left prior to triage, advised by registration to wait for iv removal but left anyway

## 2020-01-27 ENCOUNTER — Ambulatory Visit: Payer: Medicare Other | Admitting: Family Medicine

## 2020-03-08 ENCOUNTER — Other Ambulatory Visit: Payer: Self-pay | Admitting: Family Medicine

## 2020-03-24 ENCOUNTER — Other Ambulatory Visit: Payer: Self-pay | Admitting: Family Medicine

## 2020-04-14 ENCOUNTER — Other Ambulatory Visit: Payer: Self-pay | Admitting: Family Medicine

## 2020-05-04 ENCOUNTER — Other Ambulatory Visit: Payer: Self-pay | Admitting: Family Medicine

## 2020-05-13 ENCOUNTER — Other Ambulatory Visit: Payer: Self-pay | Admitting: Family Medicine

## 2020-06-22 ENCOUNTER — Other Ambulatory Visit: Payer: Self-pay | Admitting: Family Medicine

## 2020-06-22 DIAGNOSIS — Z1231 Encounter for screening mammogram for malignant neoplasm of breast: Secondary | ICD-10-CM

## 2020-07-19 ENCOUNTER — Other Ambulatory Visit: Payer: Self-pay | Admitting: Family Medicine

## 2020-07-30 ENCOUNTER — Ambulatory Visit: Payer: Medicare Other

## 2020-08-10 ENCOUNTER — Ambulatory Visit: Payer: Self-pay | Admitting: *Deleted

## 2020-08-17 ENCOUNTER — Other Ambulatory Visit: Payer: Self-pay | Admitting: Family Medicine

## 2020-08-19 ENCOUNTER — Ambulatory Visit: Payer: Self-pay

## 2020-09-17 ENCOUNTER — Ambulatory Visit: Payer: Medicare Other

## 2020-09-17 ENCOUNTER — Ambulatory Visit
Admission: RE | Admit: 2020-09-17 | Discharge: 2020-09-17 | Disposition: A | Discharge status: Home / Self Care | Payer: Medicare Other | Source: Ambulatory Visit | Attending: Family Medicine | Admitting: Family Medicine

## 2020-09-17 ENCOUNTER — Other Ambulatory Visit: Payer: Self-pay

## 2020-09-17 DIAGNOSIS — Z1231 Encounter for screening mammogram for malignant neoplasm of breast: Secondary | ICD-10-CM

## 2020-09-27 ENCOUNTER — Other Ambulatory Visit: Payer: Self-pay | Admitting: Family Medicine

## 2020-09-30 ENCOUNTER — Ambulatory Visit (INDEPENDENT_AMBULATORY_CARE_PROVIDER_SITE_OTHER): Payer: Medicare Other

## 2020-09-30 VITALS — Ht 67.0 in | Wt 225.0 lb

## 2020-09-30 DIAGNOSIS — Z0000 Encounter for general adult medical examination without abnormal findings: Secondary | ICD-10-CM
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
## 2020-09-30 NOTE — Patient Instructions (Signed)
Phyllis Washington , Thank you for taking time to complete your Medicare Wellness Visit. I appreciate your ongoing commitment to your health goals. Please review the following plan we discussed and let me know if I can assist you in the future.   Screening recommendations/referrals: Colonoscopy: Not yet indicated. Due at age 40. Mammogram: Completed 09/17/2020-Due 09/17/2021 Bone Density: Not yet indicated. Due at age 31. Recommended yearly ophthalmology/optometry visit for glaucoma screening and checkup Recommended yearly dental visit for hygiene and checkup  Vaccinations: Influenza vaccine: Declined Pneumococcal vaccine:Not yet indicated.  Due at age 75 Tdap vaccine: Up to date-Due 04/23/2023 Shingles vaccine: Not yet indicated. Due at age 3 Covid-19: Declined  Advanced directives: Please bring a copy for your chart  Conditions/risks identified: See problem list  Next appointment: Follow up in one year for your annual wellness visit. 10/06/2021 @ 11:00  Preventive Care 40-64 Years, Female Preventive care refers to lifestyle choices and visits with your health care provider that can promote health and wellness. What does preventive care include?  A yearly physical exam. This is also called an annual well check.  Dental exams once or twice a year.  Routine eye exams. Ask your health care provider how often you should have your eyes checked.  Personal lifestyle choices, including:  Daily care of your teeth and gums.  Regular physical activity.  Eating a healthy diet.  Avoiding tobacco and drug use.  Limiting alcohol use.  Practicing safe sex.  Taking low-dose aspirin daily starting at age 88.  Taking vitamin and mineral supplements as recommended by your health care provider. What happens during an annual well check? The services and screenings done by your health care provider during your annual well check will depend on your age, overall health, lifestyle risk factors, and  family history of disease. Counseling  Your health care provider may ask you questions about your:  Alcohol use.  Tobacco use.  Drug use.  Emotional well-being.  Home and relationship well-being.  Sexual activity.  Eating habits.  Work and work Statistician.  Method of birth control.  Menstrual cycle.  Pregnancy history. Screening  You may have the following tests or measurements:  Height, weight, and BMI.  Blood pressure.  Lipid and cholesterol levels. These may be checked every 5 years, or more frequently if you are over 48 years old.  Skin check.  Lung cancer screening. You may have this screening every year starting at age 14 if you have a 30-pack-year history of smoking and currently smoke or have quit within the past 15 years.  Fecal occult blood test (FOBT) of the stool. You may have this test every year starting at age 72.  Flexible sigmoidoscopy or colonoscopy. You may have a sigmoidoscopy every 5 years or a colonoscopy every 10 years starting at age 54.  Hepatitis C blood test.  Hepatitis B blood test.  Sexually transmitted disease (STD) testing.  Diabetes screening. This is done by checking your blood sugar (glucose) after you have not eaten for a while (fasting). You may have this done every 1-3 years.  Mammogram. This may be done every 1-2 years. Talk to your health care provider about when you should start having regular mammograms. This may depend on whether you have a family history of breast cancer.  BRCA-related cancer screening. This may be done if you have a family history of breast, ovarian, tubal, or peritoneal cancers.  Pelvic exam and Pap test. This may be done every 3 years starting at age  21. Starting at age 93, this may be done every 5 years if you have a Pap test in combination with an HPV test.  Bone density scan. This is done to screen for osteoporosis. You may have this scan if you are at high risk for osteoporosis. Discuss your  test results, treatment options, and if necessary, the need for more tests with your health care provider. Vaccines  Your health care provider may recommend certain vaccines, such as:  Influenza vaccine. This is recommended every year.  Tetanus, diphtheria, and acellular pertussis (Tdap, Td) vaccine. You may need a Td booster every 10 years.  Zoster vaccine. You may need this after age 31.  Pneumococcal 13-valent conjugate (PCV13) vaccine. You may need this if you have certain conditions and were not previously vaccinated.  Pneumococcal polysaccharide (PPSV23) vaccine. You may need one or two doses if you smoke cigarettes or if you have certain conditions. Talk to your health care provider about which screenings and vaccines you need and how often you need them. This information is not intended to replace advice given to you by your health care provider. Make sure you discuss any questions you have with your health care provider. Document Released: 07/09/2015 Document Revised: 03/01/2016 Document Reviewed: 04/13/2015 Elsevier Interactive Patient Education  2017 [employer] Prevention in the Home Falls can cause injuries. They can happen to people of all ages. There are many things you can do to make your home safe and to help prevent falls. What can I do on the outside of my home?  Regularly fix the edges of walkways and driveways and fix any cracks.  Remove anything that might make you trip as you walk through a door, such as a raised step or threshold.  Trim any bushes or trees on the path to your home.  Use bright outdoor lighting.  Clear any walking paths of anything that might make someone trip, such as rocks or tools.  Regularly check to see if handrails are loose or broken. Make sure that both sides of any steps have handrails.  Any raised decks and porches should have guardrails on the edges.  Have any leaves, snow, or ice cleared regularly.  Use sand or  salt on walking paths during winter.  Clean up any spills in your garage right away. This includes oil or grease spills. What can I do in the bathroom?  Use night lights.  Install grab bars by the toilet and in the tub and shower. Do not use towel bars as grab bars.  Use non-skid mats or decals in the tub or shower.  If you need to sit down in the shower, use a plastic, non-slip stool.  Keep the floor dry. Clean up any water that spills on the floor as soon as it happens.  Remove soap buildup in the tub or shower regularly.  Attach bath mats securely with double-sided non-slip rug tape.  Do not have throw rugs and other things on the floor that can make you trip. What can I do in the bedroom?  Use night lights.  Make sure that you have a light by your bed that is easy to reach.  Do not use any sheets or blankets that are too big for your bed. They should not hang down onto the floor.  Have a firm chair that has side arms. You can use this for support while you get dressed.  Do not have throw rugs and other things on  the floor that can make you trip. What can I do in the kitchen?  Clean up any spills right away.  Avoid walking on wet floors.  Keep items that you use a lot in easy-to-reach places.  If you need to reach something above you, use a strong step stool that has a grab bar.  Keep electrical cords out of the way.  Do not use floor polish or wax that makes floors slippery. If you must use wax, use non-skid floor wax.  Do not have throw rugs and other things on the floor that can make you trip. What can I do with my stairs?  Do not leave any items on the stairs.  Make sure that there are handrails on both sides of the stairs and use them. Fix handrails that are broken or loose. Make sure that handrails are as long as the stairways.  Check any carpeting to make sure that it is firmly attached to the stairs. Fix any carpet that is loose or worn.  Avoid having  throw rugs at the top or bottom of the stairs. If you do have throw rugs, attach them to the floor with carpet tape.  Make sure that you have a light switch at the top of the stairs and the bottom of the stairs. If you do not have them, ask someone to add them for you. What else can I do to help prevent falls?  Wear shoes that:  Do not have high heels.  Have rubber bottoms.  Are comfortable and fit you well.  Are closed at the toe. Do not wear sandals.  If you use a stepladder:  Make sure that it is fully opened. Do not climb a closed stepladder.  Make sure that both sides of the stepladder are locked into place.  Ask someone to hold it for you, if possible.  Clearly mark and make sure that you can see:  Any grab bars or handrails.  First and last steps.  Where the edge of each step is.  Use tools that help you move around (mobility aids) if they are needed. These include:  Canes.  Walkers.  Scooters.  Crutches.  Turn on the lights when you go into a dark area. Replace any light bulbs as soon as they burn out.  Set up your furniture so you have a clear path. Avoid moving your furniture around.  If any of your floors are uneven, fix them.  If there are any pets around you, be aware of where they are.  Review your medicines with your doctor. Some medicines can make you feel dizzy. This can increase your chance of falling. Ask your doctor what other things that you can do to help prevent falls. This information is not intended to replace advice given to you by your health care provider. Make sure you discuss any questions you have with your health care provider. Document Released: 04/08/2009 Document Revised: 11/18/2015 Document Reviewed: 07/17/2014 Elsevier Interactive Patient Education  2017 [employer].

## 2020-10-19 ENCOUNTER — Other Ambulatory Visit: Payer: Self-pay | Admitting: Family Medicine

## 2020-10-19 NOTE — Telephone Encounter (Signed)
Last filled 05/13/20 with 2 refills.  Do you want to continue?

## 2020-10-20 NOTE — Telephone Encounter (Signed)
Left message on machine to call back  

## 2020-10-26 ENCOUNTER — Other Ambulatory Visit: Payer: Self-pay | Admitting: *Deleted

## 2020-10-26 MED ORDER — NICOTINE 14 MG/24HR TD PT24: 14.0000 mg | MEDICATED_PATCH | Freq: Every day | TRANSDERMAL | 1 refills | Status: DC

## 2020-10-26 NOTE — Telephone Encounter (Signed)
Spoke with patient and she is ready to move down to the 14mg .  14mg  patches sent in.

## 2020-10-31 ENCOUNTER — Other Ambulatory Visit: Payer: Self-pay | Admitting: Family Medicine

## 2021-01-06 ENCOUNTER — Other Ambulatory Visit: Payer: Self-pay | Admitting: Family Medicine

## 2021-01-17 ENCOUNTER — Other Ambulatory Visit: Payer: Self-pay | Admitting: Family Medicine

## 2021-01-19 ENCOUNTER — Other Ambulatory Visit: Payer: Self-pay | Admitting: Family Medicine

## 2021-01-24 ENCOUNTER — Other Ambulatory Visit: Payer: Self-pay | Admitting: Family Medicine

## 2021-05-03 ENCOUNTER — Other Ambulatory Visit: Payer: Self-pay | Admitting: Family Medicine

## 2021-06-09 ENCOUNTER — Encounter: Payer: Self-pay | Admitting: Family Medicine

## 2021-06-09 ENCOUNTER — Ambulatory Visit (INDEPENDENT_AMBULATORY_CARE_PROVIDER_SITE_OTHER): Payer: Medicare Other | Admitting: Family Medicine

## 2021-06-09 VITALS — BP 124/80 | HR 88 | Temp 97.6°F | Ht 67.0 in | Wt 231.2 lb

## 2021-06-09 DIAGNOSIS — E782 Mixed hyperlipidemia: Secondary | ICD-10-CM | POA: Diagnosis not present

## 2021-06-09 DIAGNOSIS — R946 Abnormal results of thyroid function studies: Secondary | ICD-10-CM | POA: Insufficient documentation

## 2021-06-09 DIAGNOSIS — F418 Other specified anxiety disorders: Secondary | ICD-10-CM

## 2021-06-09 DIAGNOSIS — R739 Hyperglycemia, unspecified: Secondary | ICD-10-CM

## 2021-06-09 DIAGNOSIS — D72829 Elevated white blood cell count, unspecified: Secondary | ICD-10-CM

## 2021-06-09 DIAGNOSIS — D649 Anemia, unspecified: Secondary | ICD-10-CM

## 2021-06-09 DIAGNOSIS — Z720 Tobacco use: Secondary | ICD-10-CM

## 2021-06-09 NOTE — Assessment & Plan Note (Signed)
Encourage heart healthy diet such as MIND or DASH diet, increase exercise, avoid trans fats, simple carbohydrates and processed foods, consider a krill or fish or flaxseed oil cap daily.  °

## 2021-06-09 NOTE — Assessment & Plan Note (Signed)
Trying to cut down and quit. Has dropped below 1/2 ppd and is using patches. Will continue to try and cut down further she is motivated by the recent loss of her father to gastric cancer

## 2021-06-09 NOTE — Assessment & Plan Note (Signed)
hgba1c acceptable, minimize simple carbs. Increase exercise as tolerated.  

## 2021-06-09 NOTE — Assessment & Plan Note (Signed)
Her father just past away in his early 70s from gastric cancer she is grieving his loss despite their difficult relationship but she is managing with support of family and friends on her current meds. No changes
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
## 2021-06-09 NOTE — Assessment & Plan Note (Signed)
Patient reports that her psychiatrist recently checked her thyroid studies with her lithium levels and they were not normal so we will repeat labs today and forward to psychiatry at [facility].

## 2021-06-09 NOTE — Patient Instructions (Signed)
Managing Loss, Adult °People experience loss in many different ways throughout their lives. Events such as moving, changing jobs, and losing friends can create a sense of loss. The loss may be as serious as a major health change, divorce, death of a pet, or death of a loved one. All of these types of loss are likely to create a physical and emotional reaction known as grief. Grief is the result of a major change or an absence of something or someone that you count on. Grief is a normal reaction to loss. °A variety of factors can affect your grieving experience, including: °The nature of your loss. °Your relationship to what or whom you lost. °Your understanding of grief and how to manage it. °Your support system. °Be aware that when grief becomes extreme, it can lead to more severe issues like isolation, depression, anxiety, or suicidal thoughts. Talk with your health care provider if you have any of these issues. °How to manage lifestyle changes °Keep to your normal routine as much as possible. °If you have trouble focusing or doing normal activities, it is acceptable to take some time away from your normal routine. °Spend time with friends and loved ones. °Eat a healthy diet, get plenty of sleep, and rest when you feel tired. °How to recognize changes  °The way that you deal with your grief will affect your ability to function as you normally do. When grieving, you may experience these changes: °Numbness, shock, sadness, anxiety, anger, denial, and guilt. °Thoughts about death. °Unexpected crying. °A physical sensation of emptiness in your stomach. °Problems sleeping and eating. °Tiredness (fatigue). °Loss of interest in normal activities. °Dreaming about or imagining seeing the person who died. °A need to remember what or whom you lost. °Difficulty thinking about anything other than your loss for a period of time. °Relief. If you have been expecting the loss for a while, you may feel a sense of relief when it  happens. °Follow these instructions at home: °Activity °Express your feelings in healthy ways, such as: °Talking with others about your loss. It may be helpful to find others who have had a similar loss, such as a support group. °Writing down your feelings in a journal. °Doing physical activities to release stress and emotional energy. °Doing creative activities like painting, sculpting, or playing or listening to music. °Practicing resilience. This is the ability to recover and adjust after facing challenges. Reading some resources that encourage resilience may help you to learn ways to practice those behaviors. ° °General instructions °Be patient with yourself and others. Allow the grieving process to happen, and remember that grieving takes time. °It is likely that you may never feel completely done with some grief. You may find a way to move on while still cherishing memories and feelings about your loss. °Accepting your loss is a process. It can take months or longer to adjust. °Keep all follow-up visits. This is important. °Where to find support °To get support for managing loss: °Ask your health care provider for help and recommendations, such as grief counseling or therapy. °Think about joining a support group for people who are managing a loss. °Where to find more information °You can find more information about managing loss from: °American Society of Clinical Oncology: www.cancer.net °American Psychological Association: www.apa.org °Contact a health care provider if: °Your grief is extreme and keeps getting worse. °You have ongoing grief that does not improve. °Your body shows symptoms of grief, such as illness. °You feel depressed, anxious, or   hopeless. °Get help right away if: °You have thoughts about hurting yourself or others. °Get help right away if you feel like you may hurt yourself or others, or have thoughts about taking your own life. Go to your nearest emergency room or: °Call 911. °Call the  National Suicide Prevention Lifeline at 1-800-273-8255 or 988. This is open 24 hours a day. °Text the Crisis Text Line at 741741. °Summary °Grief is the result of a major change or an absence of someone or something that you count on. Grief is a normal reaction to loss. °The depth of grief and the period of recovery depend on the type of loss and your ability to adjust to the change and process your feelings. °Processing grief requires patience and a willingness to accept your feelings and talk about your loss with people who are supportive. °It is important to find resources that work for you and to realize that people experience grief differently. There is not one grieving process that works for everyone in the same way. °Be aware that when grief becomes extreme, it can lead to more severe issues like isolation, depression, anxiety, or suicidal thoughts. Talk with your health care provider if you have any of these issues. °This information is not intended to replace advice given to you by your health care provider. Make sure you discuss any questions you have with your health care provider. °Document Revised: 01/31/2021 Document Reviewed: 01/31/2021 °Elsevier Patient Education © 2022 Elsevier Inc. ° °

## 2021-06-10 LAB — LIPID PANEL
Cholesterol: 201 mg/dL — ABNORMAL HIGH (ref 0–200)
HDL: 43.9 mg/dL (ref >39.00)
LDL Cholesterol: 132 mg/dL — ABNORMAL HIGH (ref 0–99)
NonHDL: 157.09
Total CHOL/HDL Ratio: 5
Triglycerides: 124 mg/dL (ref 0.0–149.0)
VLDL: 24.8 mg/dL (ref 0.0–40.0)

## 2021-06-10 LAB — CBC
HCT: 36.7 % (ref 36.0–46.0)
Hemoglobin: 12 g/dL (ref 12.0–15.0)
MCHC: 32.8 g/dL (ref 30.0–36.0)
MCV: 88.1 fl (ref 78.0–100.0)
Platelets: 333 10*3/uL (ref 150.0–400.0)
RBC: 4.16 Mil/uL (ref 3.87–5.11)
RDW: 13.7 % (ref 11.5–15.5)
WBC: 10.3 10*3/uL (ref 4.0–10.5)

## 2021-06-10 LAB — COMPREHENSIVE METABOLIC PANEL
ALT: 16 U/L (ref 0–35)
AST: 18 U/L (ref 0–37)
Albumin: 4.2 g/dL (ref 3.5–5.2)
Alkaline Phosphatase: 74 U/L (ref 39–117)
BUN: 8 mg/dL (ref 6–23)
CO2: 25 mEq/L (ref 19–32)
Calcium: 9.1 mg/dL (ref 8.4–10.5)
Chloride: 106 mEq/L (ref 96–112)
Creatinine, Ser: 0.85 mg/dL (ref 0.40–1.20)
GFR: 84.33 mL/min (ref >60.00)
Glucose, Bld: 92 mg/dL (ref 70–99)
Potassium: 4 mEq/L (ref 3.5–5.1)
Sodium: 138 mEq/L (ref 135–145)
Total Bilirubin: 0.3 mg/dL (ref 0.2–1.2)
Total Protein: 6.5 g/dL (ref 6.0–8.3)

## 2021-06-10 LAB — T3, FREE: T3, Free: 3.5 pg/mL (ref 2.3–4.2)

## 2021-06-10 LAB — T4, FREE: Free T4: 0.66 ng/dL (ref 0.60–1.60)

## 2021-06-10 LAB — HEMOGLOBIN A1C: Hgb A1c MFr Bld: 5.7 % (ref 4.6–6.5)

## 2021-06-10 LAB — TSH: TSH: 2.43 u[IU]/mL (ref 0.35–5.50)

## 2021-06-12 ENCOUNTER — Other Ambulatory Visit: Payer: Self-pay | Admitting: Family Medicine

## 2021-07-18 ENCOUNTER — Other Ambulatory Visit: Payer: Self-pay | Admitting: Family Medicine

## 2021-07-18 ENCOUNTER — Encounter: Payer: Self-pay | Admitting: Family Medicine

## 2021-07-18 MED ORDER — TIZANIDINE HCL 4 MG PO TABS: 4.0000 mg | ORAL_TABLET | Freq: Four times a day (QID) | ORAL | 0 refills | Status: DC | PRN

## 2021-07-18 NOTE — Telephone Encounter (Signed)
Medication: tiZANidine (ZANAFLEX) 4 MG tablet  Has the patient contacted their pharmacy? Yes.   (If no, request that the patient contact the pharmacy for the refill.) (If yes, when and what did the pharmacy advise?)  Preferred Pharmacy (with phone number or street name):   [id number] - [place], [place]  [address] [place], [place] [address]  Phone:  [phone]  Fax:  [phone]  Agent: Please be advised that RX refills may take up to 3 business days. We ask that you follow-up with your pharmacy.

## 2021-07-28 ENCOUNTER — Other Ambulatory Visit: Payer: Self-pay | Admitting: Family Medicine

## 2021-08-26 ENCOUNTER — Other Ambulatory Visit: Payer: Self-pay | Admitting: Family Medicine

## 2021-09-06 ENCOUNTER — Other Ambulatory Visit: Payer: Self-pay | Admitting: Family Medicine

## 2021-09-06 DIAGNOSIS — Z1231 Encounter for screening mammogram for malignant neoplasm of breast: Secondary | ICD-10-CM

## 2021-09-14 ENCOUNTER — Other Ambulatory Visit (HOSPITAL_COMMUNITY)
Admission: RE | Admit: 2021-09-14 | Discharge: 2021-09-14 | Disposition: A | Discharge status: Home / Self Care | Payer: Medicare Other | Source: Ambulatory Visit | Attending: Family | Admitting: Family

## 2021-09-14 ENCOUNTER — Ambulatory Visit (INDEPENDENT_AMBULATORY_CARE_PROVIDER_SITE_OTHER): Payer: Medicare Other | Admitting: Family

## 2021-09-14 VITALS — BP 118/90 | HR 110 | Temp 98.1°F | Resp 16 | Ht 67.0 in | Wt 225.0 lb

## 2021-09-14 DIAGNOSIS — F17210 Nicotine dependence, cigarettes, uncomplicated: Secondary | ICD-10-CM | POA: Diagnosis not present

## 2021-09-14 DIAGNOSIS — Z01419 Encounter for gynecological examination (general) (routine) without abnormal findings: Secondary | ICD-10-CM | POA: Diagnosis present

## 2021-09-14 DIAGNOSIS — Z720 Tobacco use: Secondary | ICD-10-CM

## 2021-09-14 DIAGNOSIS — Z1151 Encounter for screening for human papillomavirus (HPV): Secondary | ICD-10-CM | POA: Diagnosis not present

## 2021-09-14 DIAGNOSIS — F3110 Bipolar disorder, current episode manic without psychotic features, unspecified: Secondary | ICD-10-CM

## 2021-09-14 DIAGNOSIS — N841 Polyp of cervix uteri: Secondary | ICD-10-CM | POA: Diagnosis not present

## 2021-09-14 NOTE — Patient Instructions (Signed)
Please continue to follow up with psychiatry and counseling.  ? ?

## 2021-09-14 NOTE — Assessment & Plan Note (Signed)
Patient continues to have depression/anxiety symptoms. Med management and counseling being done a [facility]. Encouraged pt to continue scheduled follow up.  ?

## 2021-09-14 NOTE — Assessment & Plan Note (Signed)
Pap performed today. 

## 2021-09-14 NOTE — Assessment & Plan Note (Addendum)
Still smoking 1/2 PPD. We discussed importance of quitting and how much it would help her asthma symptoms. 5 minutes spent counseling pt on smoking cessation.  ?
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
## 2021-09-14 NOTE — Addendum Note (Signed)
Addended by: Jiles Prows on: 09/14/2021 01:51 PM ? ? Modules accepted: Orders ? ?

## 2021-09-14 NOTE — Assessment & Plan Note (Signed)
New. Refer to GYN for further evaluation. ?

## 2021-09-16 LAB — CYTOLOGY - PAP
Comment: NEGATIVE
Diagnosis: NEGATIVE
High risk HPV: NEGATIVE

## 2021-09-20 ENCOUNTER — Ambulatory Visit
Admission: RE | Admit: 2021-09-20 | Discharge: 2021-09-20 | Disposition: A | Discharge status: Home / Self Care | Payer: Medicare Other | Source: Ambulatory Visit | Attending: Family Medicine | Admitting: Family Medicine

## 2021-09-20 ENCOUNTER — Other Ambulatory Visit: Payer: Self-pay

## 2021-09-20 DIAGNOSIS — Z1231 Encounter for screening mammogram for malignant neoplasm of breast: Secondary | ICD-10-CM

## 2021-09-25 ENCOUNTER — Other Ambulatory Visit: Payer: Self-pay | Admitting: Family Medicine

## 2021-10-05 ENCOUNTER — Ambulatory Visit (INDEPENDENT_AMBULATORY_CARE_PROVIDER_SITE_OTHER): Payer: Medicare Other | Admitting: Obstetrics & Gynecology

## 2021-10-05 ENCOUNTER — Encounter: Payer: Self-pay | Admitting: Obstetrics & Gynecology

## 2021-10-05 ENCOUNTER — Other Ambulatory Visit (HOSPITAL_COMMUNITY)
Admission: RE | Admit: 2021-10-05 | Discharge: 2021-10-05 | Disposition: A | Discharge status: Home / Self Care | Payer: Medicare Other | Source: Ambulatory Visit | Attending: Obstetrics & Gynecology | Admitting: Obstetrics & Gynecology

## 2021-10-05 VITALS — BP 129/84 | HR 97 | Ht 67.0 in | Wt 267.0 lb

## 2021-10-05 DIAGNOSIS — N841 Polyp of cervix uteri: Secondary | ICD-10-CM

## 2021-10-05 DIAGNOSIS — A630 Anogenital (venereal) warts: Secondary | ICD-10-CM | POA: Diagnosis not present

## 2021-10-05 NOTE — Progress Notes (Signed)
? ?  GYN VISIT ?Patient name: Phyllis Washington MRN [id number]  Date of birth: 1980/07/07 ?Chief Complaint:   ?Referral and endocervical polyp ? ?History of Present Illness:   ?Phyllis Washington is a 41 y.o. G2P2 female being seen today for consultation from Dr. Charlett Blake due to cervical polyp ? ?This was an incidental finding during her annual.  She denies intermenstrual bleeding.  Menses are moderate to heavy, but only last 3 days.  No acute issues with her period.  Denies significant dysmenorrhea.  ? ?Pt notes genital wart near her bottom.  It does not cause any problems for her, but was concerned.  She saw another provider about this, but per pt they did not have the best interaction and she did not return. ? ?Patient's last menstrual period was 10/03/2021. ? ? ?  10/05/2021  ?  2:46 PM 09/14/2021  ? 12:55 PM 06/09/2021  ?  2:51 PM 09/30/2020  ? 11:09 AM 08/08/2019  ? 10:51 AM  ?Depression screen PHQ 2/9  ?Decreased Interest '3 3 3 '$ 0 0  ?Down, Depressed, Hopeless '3 3 3 1 1  '$ ?PHQ - 2 Score '6 6 6 1 1  '$ ?Altered sleeping 0 3 2    ?Tired, decreased energy '3 3 2    '$ ?Change in appetite '3 1 2    '$ ?Feeling bad or failure about yourself  '3 3 3    '$ ?Trouble concentrating '3 3 3    '$ ?Moving slowly or fidgety/restless '3 3 3    '$ ?Suicidal thoughts 0 0 0    ?PHQ-9 Score '21 22 21    '$ ?Difficult doing work/chores   Extremely dIfficult    ? ? ? ?Review of Systems:   ?Pertinent items are noted in HPI ?Denies fever/chills, dizziness, headaches, visual disturbances, fatigue, shortness of breath, chest pain, abdominal pain, vomiting, no problems with periods, bowel movements, urination, or intercourse unless otherwise stated above.  ?Pertinent History Reviewed:  ?Reviewed past medical,surgical, social, obstetrical and family history.  ?Reviewed problem list, medications and allergies. ?Physical Assessment:  ? ?Vitals:  ? 10/05/21 1439  ?BP: 129/84  ?Pulse: 97  ?Weight: 267 lb (121.1 kg)  ?Height: '5\' 7"'$  (1.702 m)  ?Body mass index is 41.82 kg/m?. ? ?      Physical Examination:  ? General appearance: alert, well appearing, and in no distress ? Psych: mood appropriate, normal affect ? Skin: warm & dry  ? Cardiovascular: normal heart rate noted ? Respiratory: normal respiratory effort, no distress ? Abdomen: soft, non-tender  ? Pelvic: VULVA: normal appearing vulva with no masses, tenderness or lesions, VAGINA: normal appearing vagina with normal color and discharge, no lesions, CERVIX: endocervical polyp size 1 cm ?, RECTAL: 3cm condyloma appreciated on stalk, no other abnormalities noted ? Extremities: no edema  ? ?PROCEDURE: ? ?Inform consent obtained ? ?After sterile speculum was inserted, ring forceps were used to twist polyp and remove in its entirety.  Monsels were applied.  Excellent hemostasis was noted. ? ? ?Chaperone: Alice Rieger   ? ?Assessment & Plan:  ?1) Cervical polyp ?-polyp removed without difficulty ?-sent to path ? ?2) Rectal condyloma ?-reassured pt of benign finding ?-if desired may consider removal in the future ? ? ?Return if symptoms worsen or fail to improve. ? ? ?Janyth Pupa, DO ?Attending [place] [facility] [place] ?[facility] ? ? ? ?
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
## 2021-10-06 ENCOUNTER — Ambulatory Visit: Payer: Medicare Other

## 2021-10-06 ENCOUNTER — Ambulatory Visit (INDEPENDENT_AMBULATORY_CARE_PROVIDER_SITE_OTHER): Payer: Medicare Other | Admitting: Family Medicine

## 2021-10-06 ENCOUNTER — Encounter: Payer: Self-pay | Admitting: Family Medicine

## 2021-10-06 VITALS — BP 117/70 | HR 90 | Resp 20 | Ht 67.0 in | Wt 224.8 lb

## 2021-10-06 DIAGNOSIS — D649 Anemia, unspecified: Secondary | ICD-10-CM

## 2021-10-06 DIAGNOSIS — R946 Abnormal results of thyroid function studies: Secondary | ICD-10-CM

## 2021-10-06 DIAGNOSIS — D72829 Elevated white blood cell count, unspecified: Secondary | ICD-10-CM

## 2021-10-06 DIAGNOSIS — E782 Mixed hyperlipidemia: Secondary | ICD-10-CM | POA: Diagnosis not present

## 2021-10-06 DIAGNOSIS — R739 Hyperglycemia, unspecified: Secondary | ICD-10-CM | POA: Diagnosis not present

## 2021-10-06 DIAGNOSIS — F418 Other specified anxiety disorders: Secondary | ICD-10-CM | POA: Diagnosis not present

## 2021-10-06 DIAGNOSIS — F3110 Bipolar disorder, current episode manic without psychotic features, unspecified: Secondary | ICD-10-CM

## 2021-10-06 DIAGNOSIS — N841 Polyp of cervix uteri: Secondary | ICD-10-CM

## 2021-10-06 NOTE — Patient Instructions (Signed)
Tobacco Use Disorder ?Tobacco use disorder (TUD) occurs when a person craves, seeks, and uses tobacco, regardless of the consequences. This disorder can cause problems with mental and physical health. It can affect your ability to have healthy relationships, and it can keep you from meeting your responsibilities at work, home, or school. ?Tobacco may be: ?Smoked as a cigarette or cigar. ?Inhaled using e-cigarettes. ?Smoked in a pipe or hookah. ?Chewed as smokeless tobacco. ?Inhaled into the nostrils as snuff. ?Tobacco products contain a dangerous chemical called nicotine, which is very addictive. Nicotine triggers hormones that make the body feel stimulated and works on areas of the brain that make you feel good. These effects can make it hard for people to quit nicotine. ?Tobacco contains many other unsafe chemicals that can damage almost every organ in the body. Smoking tobacco also puts others in danger due to fire risk and possible health problems caused by breathing in secondhand smoke. ?What are the signs or symptoms? ?Symptoms of TUD may include: ?Being unable to slow down or stop your tobacco use. ?Spending an abnormal amount of time getting or using tobacco. ?Craving tobacco. Cravings may last for up to 6 months after quitting. ?Tobacco use that: ?Interferes with your work, school, or home life. ?Interferes with your personal and social relationships. ?Makes you give up activities that you once enjoyed or found important. ?Using tobacco even though you know that it is: ?Dangerous or bad for your health or someone else's health. ?Causing problems in your life. ?Needing more and more of the substance to get the same effect (developing tolerance). ?Experiencing unpleasant symptoms if you do not use the substance (withdrawal). Withdrawal symptoms may include: ?Depressed, anxious, or irritable mood. ?Difficulty concentrating. ?Increased appetite. ?Restlessness or trouble sleeping. ?Using the substance to avoid  withdrawal. ?How is this diagnosed? ?This condition may be diagnosed based on: ?Your current and past tobacco use. Your health care provider may ask questions about how your tobacco use affects your life. ?A physical exam. ?You may be diagnosed with TUD if you have at least two symptoms within a 56-monthperiod. ?How is this treated? ?This condition is treated by stopping tobacco use. Many people are unable to quit on their own and need help. Treatment may include: ?Nicotine replacement therapy (NRT). NRT provides nicotine without the other harmful chemicals in tobacco. NRT gradually lowers the dosage of nicotine in the body and reduces withdrawal symptoms. NRT is available as: ?Over-the-counter gums, lozenges, and skin patches. ?Prescription mouth inhalers and nasal sprays. ?Medicine that acts on the brain to reduce cravings and withdrawal symptoms. ?A type of talk therapy that examines your triggers for tobacco use, how to avoid them, and how to cope with cravings (behavioral therapy). ?Hypnosis. This may help with withdrawal symptoms. ?Joining a support group for others coping with TUD. ?The best treatment for TUD is usually a combination of medicine, talk therapy, and support groups. Recovery can be a long process. Many people start using tobacco again after stopping (relapse). If you relapse, it does not mean that treatment will not work. ?Follow these instructions at home: ?Lifestyle ?Do not use any products that contain nicotine or tobacco, such as cigarettes and e-cigarettes. ?Avoid things that trigger tobacco use as much as you can. Triggers include people and situations that usually cause you to use tobacco. ?Avoid drinks that contain caffeine, including coffee. These may worsen some withdrawal symptoms. ?Find ways to manage stress. Wanting to smoke may cause stress, and stress can make you want to  smoke. Relaxation techniques such as deep breathing, meditation, and yoga may help. ?Attend support groups as  needed. These groups are an important part of long-term recovery for many people. ?General instructions ?Take over-the-counter and prescription medicines only as told by your health care provider. ?Check with your health care provider before taking any new prescription or over-the-counter medicines. ?Decide on a friend, family member, or smoking quit-line (such as 1-800-QUIT-NOW in the [place] that you can call or text when you feel the urge to smoke or when you need help coping with cravings. ?Keep all follow-up visits as told by your health care provider and therapist. This is important. ?Contact a health care provider if: ?You are not able to take your medicines as prescribed. ?Your symptoms get worse, even with treatment. ?Summary ?Tobacco use disorder (TUD) occurs when a person craves, seeks, and uses tobacco regardless of the consequences. ?This condition may be diagnosed based on your current and past tobacco use and a physical exam. ?Many people are unable to quit on their own and need help. Recovery can be a long process. ?The most effective treatment for TUD is usually a combination of medicine, talk therapy, and support groups. ?This information is not intended to replace advice given to you by your health care provider. Make sure you discuss any questions you have with your health care provider. ?Document Revised: 02/18/2021 Document Reviewed: 05/04/2020 ?Elsevier Patient Education ? [place]. ? ?

## 2021-10-06 NOTE — Assessment & Plan Note (Signed)
Removed by Dr Nelda Marseille, of ob/gyn. No complications or difficulties ?

## 2021-10-06 NOTE — Assessment & Plan Note (Signed)
hgba1c acceptable, minimize simple carbs. Increase exercise as tolerated.  

## 2021-10-07 LAB — COMPREHENSIVE METABOLIC PANEL
ALT: 15 U/L (ref 0–35)
AST: 17 U/L (ref 0–37)
Albumin: 4.4 g/dL (ref 3.5–5.2)
Alkaline Phosphatase: 85 U/L (ref 39–117)
BUN: 9 mg/dL (ref 6–23)
CO2: 26 mEq/L (ref 19–32)
Calcium: 9.5 mg/dL (ref 8.4–10.5)
Chloride: 104 mEq/L (ref 96–112)
Creatinine, Ser: 0.84 mg/dL (ref 0.40–1.20)
GFR: 85.34 mL/min (ref >60.00)
Glucose, Bld: 97 mg/dL (ref 70–99)
Potassium: 4.5 mEq/L (ref 3.5–5.1)
Sodium: 139 mEq/L (ref 135–145)
Total Bilirubin: 0.5 mg/dL (ref 0.2–1.2)
Total Protein: 6.5 g/dL (ref 6.0–8.3)

## 2021-10-07 LAB — LIPID PANEL
Cholesterol: 206 mg/dL — ABNORMAL HIGH (ref 0–200)
HDL: 48.1 mg/dL (ref >39.00)
LDL Cholesterol: 145 mg/dL — ABNORMAL HIGH (ref 0–99)
NonHDL: 157.71
Total CHOL/HDL Ratio: 4
Triglycerides: 65 mg/dL (ref 0.0–149.0)
VLDL: 13 mg/dL (ref 0.0–40.0)

## 2021-10-07 LAB — CBC
HCT: 35.8 % — ABNORMAL LOW (ref 36.0–46.0)
Hemoglobin: 12 g/dL (ref 12.0–15.0)
MCHC: 33.5 g/dL (ref 30.0–36.0)
MCV: 88.3 fl (ref 78.0–100.0)
Platelets: 361 10*3/uL (ref 150.0–400.0)
RBC: 4.06 Mil/uL (ref 3.87–5.11)
RDW: 13.3 % (ref 11.5–15.5)
WBC: 9.2 10*3/uL (ref 4.0–10.5)

## 2021-10-07 LAB — TSH: TSH: 2.6 u[IU]/mL (ref 0.35–5.50)

## 2021-10-07 LAB — SURGICAL PATHOLOGY

## 2021-10-07 LAB — HEMOGLOBIN A1C: Hgb A1c MFr Bld: 5.7 % (ref 4.6–6.5)

## 2021-10-11 ENCOUNTER — Ambulatory Visit (INDEPENDENT_AMBULATORY_CARE_PROVIDER_SITE_OTHER): Payer: Medicare Other

## 2021-10-11 DIAGNOSIS — Z0000 Encounter for general adult medical examination without abnormal findings: Secondary | ICD-10-CM | POA: Diagnosis not present

## 2021-10-11 NOTE — Patient Instructions (Signed)
Phyllis Washington , ?Thank you for taking time to come for your Medicare Wellness Visit. I appreciate your ongoing commitment to your health goals. Please review the following plan we discussed and let me know if I can assist you in the future.  ? ?Screening recommendations/referrals: ?Colonoscopy: N/A ?Mammogram: N/A ?Bone Density: N/A ?Recommended yearly ophthalmology/optometry visit for glaucoma screening and checkup ?Recommended yearly dental visit for hygiene and checkup ? ?Vaccinations: ?Influenza vaccine: Due-May obtain vaccine at your local pharmacy.  ?Pneumococcal vaccine: N/A ?Tdap vaccine: up to date ?Shingles vaccine: N/A  ?Covid-19: Due-May obtain vaccine at your local pharmacy.  ? ?Advanced directives: no ? ?Conditions/risks identified: see problem list ? ?Next appointment: Follow up in one year for your annual wellness visit. 10/13/22 ? ?Preventive Care 40-64 Years, Female ?Preventive care refers to lifestyle choices and visits with your health care provider that can promote health and wellness. ?What does preventive care include? ?A yearly physical exam. This is also called an annual well check. ?Dental exams once or twice a year. ?Routine eye exams. Ask your health care provider how often you should have your eyes checked. ?Personal lifestyle choices, including: ?Daily care of your teeth and gums. ?Regular physical activity. ?Eating a healthy diet. ?Avoiding tobacco and drug use. ?Limiting alcohol use. ?Practicing safe sex. ?Taking low-dose aspirin daily starting at age 34. ?Taking vitamin and mineral supplements as recommended by your health care provider. ?What happens during an annual well check? ?The services and screenings done by your health care provider during your annual well check will depend on your age, overall health, lifestyle risk factors, and family history of disease. ?Counseling  ?Your health care provider may ask you questions about your: ?Alcohol use. ?Tobacco use. ?Drug use. ?Emotional  well-being. ?Home and relationship well-being. ?Sexual activity. ?Eating habits. ?Work and work Statistician. ?Method of birth control. ?Menstrual cycle. ?Pregnancy history. ?Screening  ?You may have the following tests or measurements: ?Height, weight, and BMI. ?Blood pressure. ?Lipid and cholesterol levels. These may be checked every 5 years, or more frequently if you are over 61 years old. ?Skin check. ?Lung cancer screening. You may have this screening every year starting at age 49 if you have a 30-pack-year history of smoking and currently smoke or have quit within the past 15 years. ?Fecal occult blood test (FOBT) of the stool. You may have this test every year starting at age 39. ?Flexible sigmoidoscopy or colonoscopy. You may have a sigmoidoscopy every 5 years or a colonoscopy every 10 years starting at age 40. ?Hepatitis C blood test. ?Hepatitis B blood test. ?Sexually transmitted disease (STD) testing. ?Diabetes screening. This is done by checking your blood sugar (glucose) after you have not eaten for a while (fasting). You may have this done every 1-3 years. ?Mammogram. This may be done every 1-2 years. Talk to your health care provider about when you should start having regular mammograms. This may depend on whether you have a family history of breast cancer. ?BRCA-related cancer screening. This may be done if you have a family history of breast, ovarian, tubal, or peritoneal cancers. ?Pelvic exam and Pap test. This may be done every 3 years starting at age 73. Starting at age 53, this may be done every 5 years if you have a Pap test in combination with an HPV test. ?Bone density scan. This is done to screen for osteoporosis. You may have this scan if you are at high risk for osteoporosis. ?Discuss your test results, treatment options, and if necessary,  the need for more tests with your health care provider. ?Vaccines  ?Your health care provider may recommend certain vaccines, such as: ?Influenza  vaccine. This is recommended every year. ?Tetanus, diphtheria, and acellular pertussis (Tdap, Td) vaccine. You may need a Td booster every 10 years. ?Zoster vaccine. You may need this after age 38. ?Pneumococcal 13-valent conjugate (PCV13) vaccine. You may need this if you have certain conditions and were not previously vaccinated. ?Pneumococcal polysaccharide (PPSV23) vaccine. You may need one or two doses if you smoke cigarettes or if you have certain conditions. ?Talk to your health care provider about which screenings and vaccines you need and how often you need them. ?This information is not intended to replace advice given to you by your health care provider. Make sure you discuss any questions you have with your health care provider. ?Document Released: 07/09/2015 Document Revised: 03/01/2016 Document Reviewed: 04/13/2015 ?Elsevier Interactive Patient Education ? 2017 [employer] ? ? ? ?Fall Prevention in the Home ?Falls can cause injuries. They can happen to people of all ages. There are many things you can do to make your home safe and to help prevent falls. ?What can I do on the outside of my home? ?Regularly fix the edges of walkways and driveways and fix any cracks. ?Remove anything that might make you trip as you walk through a door, such as a raised step or threshold. ?Trim any bushes or trees on the path to your home. ?Use bright outdoor lighting. ?Clear any walking paths of anything that might make someone trip, such as rocks or tools. ?Regularly check to see if handrails are loose or broken. Make sure that both sides of any steps have handrails. ?Any raised decks and porches should have guardrails on the edges. ?Have any leaves, snow, or ice cleared regularly. ?Use sand or salt on walking paths during winter. ?Clean up any spills in your garage right away. This includes oil or grease spills. ?What can I do in the bathroom? ?Use night lights. ?Install grab bars by the toilet and in the tub and  shower. Do not use towel bars as grab bars. ?Use non-skid mats or decals in the tub or shower. ?If you need to sit down in the shower, use a plastic, non-slip stool. ?Keep the floor dry. Clean up any water that spills on the floor as soon as it happens. ?Remove soap buildup in the tub or shower regularly. ?Attach bath mats securely with double-sided non-slip rug tape. ?Do not have throw rugs and other things on the floor that can make you trip. ?What can I do in the bedroom? ?Use night lights. ?Make sure that you have a light by your bed that is easy to reach. ?Do not use any sheets or blankets that are too big for your bed. They should not hang down onto the floor. ?Have a firm chair that has side arms. You can use this for support while you get dressed. ?Do not have throw rugs and other things on the floor that can make you trip. ?What can I do in the kitchen? ?Clean up any spills right away. ?Avoid walking on wet floors. ?Keep items that you use a lot in easy-to-reach places. ?If you need to reach something above you, use a strong step stool that has a grab bar. ?Keep electrical cords out of the way. ?Do not use floor polish or wax that makes floors slippery. If you must use wax, use non-skid floor wax. ?Do not have throw rugs  and other things on the floor that can make you trip. ?What can I do with my stairs? ?Do not leave any items on the stairs. ?Make sure that there are handrails on both sides of the stairs and use them. Fix handrails that are broken or loose. Make sure that handrails are as long as the stairways. ?Check any carpeting to make sure that it is firmly attached to the stairs. Fix any carpet that is loose or worn. ?Avoid having throw rugs at the top or bottom of the stairs. If you do have throw rugs, attach them to the floor with carpet tape. ?Make sure that you have a light switch at the top of the stairs and the bottom of the stairs. If you do not have them, ask someone to add them for  you. ?What else can I do to help prevent falls? ?Wear shoes that: ?Do not have high heels. ?Have rubber bottoms. ?Are comfortable and fit you well. ?Are closed at the toe. Do not wear sandals. ?If you use a stepladder: ?M
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
## 2021-10-19 ENCOUNTER — Encounter: Payer: Self-pay | Admitting: Family Medicine

## 2021-10-19 ENCOUNTER — Other Ambulatory Visit: Payer: Self-pay

## 2021-10-24 ENCOUNTER — Other Ambulatory Visit: Payer: Self-pay | Admitting: Family Medicine

## 2021-10-27 ENCOUNTER — Other Ambulatory Visit: Payer: Self-pay | Admitting: Family Medicine

## 2021-11-17 ENCOUNTER — Other Ambulatory Visit: Payer: Self-pay | Admitting: Family Medicine
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
## 2021-12-06 ENCOUNTER — Encounter: Payer: Self-pay | Admitting: Family Medicine

## 2021-12-06 ENCOUNTER — Ambulatory Visit (INDEPENDENT_AMBULATORY_CARE_PROVIDER_SITE_OTHER): Payer: Medicare Other | Admitting: Family Medicine

## 2021-12-06 VITALS — BP 122/78 | HR 99 | Resp 20 | Ht 67.0 in | Wt 228.0 lb

## 2021-12-06 DIAGNOSIS — M722 Plantar fascial fibromatosis: Secondary | ICD-10-CM

## 2021-12-06 DIAGNOSIS — D649 Anemia, unspecified: Secondary | ICD-10-CM

## 2021-12-06 DIAGNOSIS — F3110 Bipolar disorder, current episode manic without psychotic features, unspecified: Secondary | ICD-10-CM

## 2021-12-06 DIAGNOSIS — R739 Hyperglycemia, unspecified: Secondary | ICD-10-CM

## 2021-12-06 DIAGNOSIS — E782 Mixed hyperlipidemia: Secondary | ICD-10-CM | POA: Diagnosis not present

## 2021-12-06 DIAGNOSIS — M79605 Pain in left leg: Secondary | ICD-10-CM

## 2021-12-06 DIAGNOSIS — R946 Abnormal results of thyroid function studies: Secondary | ICD-10-CM

## 2021-12-06 NOTE — Assessment & Plan Note (Signed)
hgba1c acceptable, minimize simple carbs. Increase exercise as tolerated.  

## 2021-12-06 NOTE — Assessment & Plan Note (Signed)
TSH normal

## 2021-12-06 NOTE — Patient Instructions (Signed)
Lidocaine, Voltaren, CBD Daily can mix and match these topical rubs.    Plantar Fasciitis  Plantar fasciitis is a painful foot condition that affects the heel. It occurs when the band of tissue that connects the toes to the heel bone (plantar fascia) becomes irritated. This can happen as the result of exercising too much or doing other repetitive activities (overuse injury). Plantar fasciitis can cause mild irritation to severe pain that makes it difficult to walk or move. The pain is usually worse in the morning after sleeping, or after sitting or lying down for a period of time. Pain may also be worse after long periods of walking or standing. What are the causes? This condition may be caused by: Standing for long periods of time. Wearing shoes that do not have good arch support. Doing activities that put stress on joints (high-impact activities). This includes ballet and exercise that makes your heart beat faster (aerobic exercise), such as running. Being overweight. An abnormal way of walking (gait). Tight muscles in the back of your lower leg (calf). High arches in your feet or flat feet. Starting a new athletic activity. What are the signs or symptoms? The main symptom of this condition is heel pain. Pain may get worse after the following: Taking the first steps after a time of rest, especially in the morning after awakening, or after you have been sitting or lying down for a while. Long periods of standing still. Pain may decrease after 30-45 minutes of activity, such as gentle walking. How is this diagnosed? This condition may be diagnosed based on your medical history, a physical exam, and your symptoms. Your health care provider will check for: A tender area on the bottom of your foot. A high arch in your foot or flat feet. Pain when you move your foot. Difficulty moving your foot. You may have imaging tests to confirm the diagnosis, such as: X-rays. Ultrasound. MRI. How is  this treated? Treatment for plantar fasciitis depends on how severe your condition is. Treatment may include: Rest, ice, pressure (compression), and raising (elevating) the affected foot. This is called RICE therapy. Your health care provider may recommend RICE therapy along with over-the-counter pain medicines to manage your pain. Exercises to stretch your calves and your plantar fascia. A splint that holds your foot in a stretched, upward position while you sleep (night splint). Physical therapy to relieve symptoms and prevent problems in the future. Injections of steroid medicine (cortisone) to relieve pain and inflammation. Stimulating your plantar fascia with electrical impulses (extracorporeal shock wave therapy). This is usually the last treatment option before surgery. Surgery, if other treatments have not worked after 12 months. Follow these instructions at home: Managing pain, stiffness, and swelling  If directed, put ice on the painful area. To do this: Put ice in a plastic bag, or use a frozen bottle of water. Place a towel between your skin and the bag or bottle. Roll the bottom of your foot over the bag or bottle. Do this for 20 minutes, 2-3 times a day. Wear athletic shoes that have air-sole or gel-sole cushions, or try soft shoe inserts that are designed for plantar fasciitis. Elevate your foot above the level of your heart while you are sitting or lying down. Activity Avoid activities that cause pain. Ask your health care provider what activities are safe for you. Do physical therapy exercises and stretches as told by your health care provider. Try activities and forms of exercise that are easier on your  joints (low impact). Examples include swimming, water aerobics, and biking. General instructions Take over-the-counter and prescription medicines only as told by your health care provider. Wear a night splint while sleeping, if told by your health care provider. Loosen the  splint if your toes tingle, become numb, or turn cold and blue. Maintain a healthy weight, or work with your health care provider to lose weight as needed. Keep all follow-up visits. This is important. Contact a health care provider if you have: Symptoms that do not go away with home treatment. Pain that gets worse. Pain that affects your ability to move or do daily activities. Summary Plantar fasciitis is a painful foot condition that affects the heel. It occurs when the band of tissue that connects the toes to the heel bone (plantar fascia) becomes irritated. Heel pain is the main symptom of this condition. It may get worse after exercising too much or standing still for a long time. Treatment varies, but it usually starts with rest, ice, pressure (compression), and raising (elevating) the affected foot. This is called RICE therapy. Over-the-counter medicines can also be used to manage pain. This information is not intended to replace advice given to you by your health care provider. Make sure you discuss any questions you have with your health care provider. Document Revised: 09/29/2019 Document Reviewed: 09/29/2019 Elsevier Patient Education  [place].

## 2021-12-06 NOTE — Assessment & Plan Note (Signed)
Encourage heart healthy diet such as MIND or DASH diet, increase exercise, avoid trans fats, simple carbohydrates and processed foods, consider a krill or fish or flaxseed oil cap daily.  °

## 2021-12-06 NOTE — Assessment & Plan Note (Signed)
Has been working on building a tiny house, working on Academic librarian, dry wall etc and she is having trouble with left foot pain. Her big toe burns and her heel is very painful. Feels better if she sits and elevated. Working or sitting with it down on the ground. Likely plantar fasciitis, has tried epsom salt soaks and stretching has not helped  Encouraged ice, topical treatments. And referred to podiatry.

## 2021-12-06 NOTE — Assessment & Plan Note (Signed)
Following with psychiatry and stable

## 2021-12-07 ENCOUNTER — Other Ambulatory Visit: Payer: Self-pay | Admitting: Family Medicine

## 2021-12-12 ENCOUNTER — Ambulatory Visit: Payer: Medicare Other | Admitting: Family Medicine

## 2021-12-14 ENCOUNTER — Ambulatory Visit (INDEPENDENT_AMBULATORY_CARE_PROVIDER_SITE_OTHER): Payer: Medicare Other

## 2021-12-14 ENCOUNTER — Ambulatory Visit (INDEPENDENT_AMBULATORY_CARE_PROVIDER_SITE_OTHER): Payer: Medicare Other | Admitting: Podiatry

## 2021-12-14 DIAGNOSIS — M722 Plantar fascial fibromatosis: Secondary | ICD-10-CM

## 2021-12-14 MED ORDER — MELOXICAM 15 MG PO TABS: 15.0000 mg | ORAL_TABLET | Freq: Every day | ORAL | 1 refills | Status: DC

## 2021-12-14 MED ORDER — BETAMETHASONE SOD PHOS & ACET 6 (3-3) MG/ML IJ SUSP
3.0000 mg | Freq: Once | INTRAMUSCULAR | Status: AC
  Administered 2021-12-14: 3 mg via INTRA_ARTICULAR

## 2021-12-14 MED ORDER — METHYLPREDNISOLONE 4 MG PO TBPK: ORAL_TABLET | ORAL | 0 refills | Status: DC
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
## 2021-12-26 ENCOUNTER — Other Ambulatory Visit: Payer: Self-pay | Admitting: Family Medicine

## 2022-01-09 ENCOUNTER — Other Ambulatory Visit: Payer: Self-pay | Admitting: Family Medicine

## 2022-01-23 ENCOUNTER — Ambulatory Visit (INDEPENDENT_AMBULATORY_CARE_PROVIDER_SITE_OTHER): Payer: Medicare Other | Admitting: Podiatry

## 2022-01-23 DIAGNOSIS — M722 Plantar fascial fibromatosis: Secondary | ICD-10-CM | POA: Diagnosis not present

## 2022-01-23 MED ORDER — BETAMETHASONE SOD PHOS & ACET 6 (3-3) MG/ML IJ SUSP
3.0000 mg | Freq: Once | INTRAMUSCULAR | Status: AC
  Administered 2022-01-23: 3 mg via INTRA_ARTICULAR
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
## 2022-03-01 ENCOUNTER — Ambulatory Visit (INDEPENDENT_AMBULATORY_CARE_PROVIDER_SITE_OTHER): Payer: Medicare Other | Admitting: Podiatry

## 2022-03-01 DIAGNOSIS — M722 Plantar fascial fibromatosis: Secondary | ICD-10-CM
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
## 2022-03-14 ENCOUNTER — Other Ambulatory Visit: Payer: Self-pay | Admitting: Family Medicine

## 2022-03-20 NOTE — Assessment & Plan Note (Signed)
Stable on current meds 

## 2022-03-20 NOTE — Assessment & Plan Note (Signed)
hgba1c acceptable, minimize simple carbs. Increase exercise as tolerated.  

## 2022-03-20 NOTE — Assessment & Plan Note (Signed)
Encourage heart healthy diet such as MIND or DASH diet, increase exercise, avoid trans fats, simple carbohydrates and processed foods, consider a krill or fish or flaxseed oil cap daily.  °

## 2022-03-20 NOTE — Assessment & Plan Note (Signed)
Encouraged complete cessation. 

## 2022-03-21 ENCOUNTER — Ambulatory Visit (INDEPENDENT_AMBULATORY_CARE_PROVIDER_SITE_OTHER): Payer: Medicare Other | Admitting: Family Medicine

## 2022-03-21 VITALS — BP 124/74 | HR 97 | Temp 97.7°F | Resp 16 | Ht 67.0 in | Wt 212.4 lb

## 2022-03-21 DIAGNOSIS — F3110 Bipolar disorder, current episode manic without psychotic features, unspecified: Secondary | ICD-10-CM

## 2022-03-21 DIAGNOSIS — E782 Mixed hyperlipidemia: Secondary | ICD-10-CM | POA: Diagnosis not present

## 2022-03-21 DIAGNOSIS — F418 Other specified anxiety disorders: Secondary | ICD-10-CM

## 2022-03-21 DIAGNOSIS — R739 Hyperglycemia, unspecified: Secondary | ICD-10-CM

## 2022-03-21 DIAGNOSIS — Z720 Tobacco use: Secondary | ICD-10-CM

## 2022-03-21 DIAGNOSIS — D649 Anemia, unspecified: Secondary | ICD-10-CM | POA: Diagnosis not present

## 2022-03-21 LAB — LIPID PANEL
Cholesterol: 210 mg/dL — ABNORMAL HIGH (ref 0–200)
HDL: 43.5 mg/dL (ref >39.00)
LDL Cholesterol: 143 mg/dL — ABNORMAL HIGH (ref 0–99)
NonHDL: 166.9
Total CHOL/HDL Ratio: 5
Triglycerides: 122 mg/dL (ref 0.0–149.0)
VLDL: 24.4 mg/dL (ref 0.0–40.0)

## 2022-03-21 LAB — COMPREHENSIVE METABOLIC PANEL
ALT: 19 U/L (ref 0–35)
AST: 17 U/L (ref 0–37)
Albumin: 4.4 g/dL (ref 3.5–5.2)
Alkaline Phosphatase: 68 U/L (ref 39–117)
BUN: 12 mg/dL (ref 6–23)
CO2: 25 mEq/L (ref 19–32)
Calcium: 9.4 mg/dL (ref 8.4–10.5)
Chloride: 104 mEq/L (ref 96–112)
Creatinine, Ser: 0.81 mg/dL (ref 0.40–1.20)
GFR: 88.86 mL/min (ref >60.00)
Glucose, Bld: 106 mg/dL — ABNORMAL HIGH (ref 70–99)
Potassium: 4.1 mEq/L (ref 3.5–5.1)
Sodium: 137 mEq/L (ref 135–145)
Total Bilirubin: 0.4 mg/dL (ref 0.2–1.2)
Total Protein: 6.9 g/dL (ref 6.0–8.3)

## 2022-03-21 LAB — HEMOGLOBIN A1C: Hgb A1c MFr Bld: 5.5 % (ref 4.6–6.5)

## 2022-03-21 LAB — CBC
HCT: 38.1 % (ref 36.0–46.0)
Hemoglobin: 13 g/dL (ref 12.0–15.0)
MCHC: 34 g/dL (ref 30.0–36.0)
MCV: 91 fl (ref 78.0–100.0)
Platelets: 304 10*3/uL (ref 150.0–400.0)
RBC: 4.19 Mil/uL (ref 3.87–5.11)
RDW: 13 % (ref 11.5–15.5)
WBC: 9.9 10*3/uL (ref 4.0–10.5)

## 2022-03-21 LAB — TSH: TSH: 2.86 u[IU]/mL (ref 0.35–5.50)
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
## 2022-03-21 NOTE — Patient Instructions (Signed)
Tobacco Use Disorder Tobacco use disorder (TUD) occurs when a person craves, seeks, and uses tobacco, regardless of the consequences. This disorder can cause problems with mental and physical health. It can affect your ability to have healthy relationships. It can also keep you from meeting your responsibilities at work, home, or school. Tobacco products contain a dangerous chemical called nicotine. Nicotine triggers hormones that make the body feel stimulated and works on areas of the brain that make a person feel good. These effects can make the person depend on nicotine, which makes it hard to quit tobacco. Tobacco may be: Smoked as a cigarette or cigar. Inhaled using vaping devices, such as e-cigarettes. Smoked in a pipe or hookah. Chewed as smokeless tobacco. Inhaled into the nostrils as snuff. What are the causes? This condition is caused by using nicotine. Nicotine causes changes in your brain that make you want more and more. This is called addiction. This can make it hard to stop using tobacco once you start. What are the signs or symptoms? Symptoms of TUD may include: Being unable to stop your tobacco use even after planned quit attempts. Spending an abnormal amount of time getting or using tobacco. Craving tobacco. Tobacco use that: Interferes with your work, school, or home life. Interferes with your personal and social relationships. Makes you give up activities that you once enjoyed or found important. Using tobacco even though you know that it is: Dangerous or bad for your health or someone else's health. Causing problems in your life. Needing more and more of the substance to get the same effect (developing tolerance). Using the substance to avoid unpleasant symptoms if you do not use the substance (withdrawal). How is this diagnosed? This condition may be diagnosed based on: Your current and past tobacco use. Your health care provider may ask questions about how your  tobacco use affects your life. A physical exam. You may be diagnosed with TUD if you have at least two symptoms within a 12-month period. How is this treated? This condition is treated by stopping tobacco use. Many people are unable to quit on their own and need help. Treatment may include: Nicotine replacement therapy (NRT). NRT provides nicotine without the other harmful chemicals in tobacco. NRT gradually lowers the dosage of nicotine in the body and reduces withdrawal symptoms. NRT is available as: Over-the-counter gums, lozenges, and skin patches. Prescription mouth inhalers and nasal sprays. Medicine that acts on the brain to reduce cravings and withdrawal symptoms. A type of talk therapy that examines your triggers for tobacco use, how to avoid them, and how to cope with cravings (behavioral therapy). Hypnosis. This may help with withdrawal symptoms. Joining a support group for people coping with TUD. The best treatment for TUD is usually a combination of medicine, talk therapy, and support groups. Recovery can be a long process. Many people start using tobacco again after stopping (relapse). If you relapse, it does not mean that treatment will not work. Follow these instructions at home:  Lifestyle Do not use any products that contain nicotine or tobacco. These products include cigarettes, chewing tobacco, and vaping devices, such as e-cigarettes. If you need help quitting, ask your health care provider. Avoid things that trigger tobacco use as much as you can. Triggers include people and situations that usually cause you to use tobacco. Avoid drinks that contain caffeine, including coffee. These may worsen some withdrawal symptoms. Find ways to manage stress. Wanting to smoke may cause stress, and stress can make you want   to smoke. Relaxation techniques such as deep breathing, meditation, and yoga may help. Attend support groups as needed. These groups are an important part of long-term  recovery for many people. General instructions Take over-the-counter and prescription medicines only as told by your health care provider. Check with your health care provider before taking any new prescription or over-the-counter medicines. Decide on a friend, family member, or smoking quit-line (such as 1-800-QUIT-NOW in the [place] that you can call or text when you feel the urge to smoke or when you need help coping with cravings. Keep all follow-up visits. This is important. Contact a health care provider if: You are not able to take your medicines as told by your health care provider. Your symptoms get worse, even with treatment. Summary Tobacco use disorder (TUD) occurs when a person craves, seeks, and uses tobacco regardless of the consequences. This condition may be diagnosed based on your current and past tobacco use and a physical exam. Many people are unable to quit on their own and need help. Recovery can be a long process. The most effective treatment for TUD is usually a combination of medicine, talk therapy, and support groups. This information is not intended to replace advice given to you by your health care provider. Make sure you discuss any questions you have with your health care provider. Document Revised: 06/07/2021 Document Reviewed: 06/07/2021 Elsevier Patient Education  2023 Elsevier Inc.  

## 2022-04-27 ENCOUNTER — Other Ambulatory Visit: Payer: Self-pay | Admitting: Family Medicine

## 2022-05-03 ENCOUNTER — Ambulatory Visit (INDEPENDENT_AMBULATORY_CARE_PROVIDER_SITE_OTHER): Payer: Medicare Other | Admitting: Podiatry

## 2022-05-03 DIAGNOSIS — M722 Plantar fascial fibromatosis: Secondary | ICD-10-CM

## 2022-05-03 MED ORDER — BETAMETHASONE SOD PHOS & ACET 6 (3-3) MG/ML IJ SUSP
3.0000 mg | Freq: Once | INTRAMUSCULAR | Status: AC
  Administered 2022-05-03: 3 mg via INTRA_ARTICULAR

## 2022-05-03 MED ORDER — MELOXICAM 15 MG PO TABS: 15.0000 mg | ORAL_TABLET | Freq: Every day | ORAL | 1 refills | Status: DC

## 2022-05-03 MED ORDER — METHYLPREDNISOLONE 4 MG PO TBPK: ORAL_TABLET | ORAL | 0 refills | Status: DC
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
## 2022-05-24 ENCOUNTER — Ambulatory Visit (INDEPENDENT_AMBULATORY_CARE_PROVIDER_SITE_OTHER): Payer: Medicare Other | Admitting: Family

## 2022-05-24 VITALS — BP 153/93 | HR 97 | Temp 98.2°F | Resp 16 | Ht 67.0 in | Wt 222.0 lb

## 2022-05-24 DIAGNOSIS — M5412 Radiculopathy, cervical region: Secondary | ICD-10-CM | POA: Diagnosis not present

## 2022-05-24 MED ORDER — METHYLPREDNISOLONE 4 MG PO TBPK: ORAL_TABLET | ORAL | 0 refills | Status: DC

## 2022-05-24 MED ORDER — TRAMADOL HCL 50 MG PO TABS: 50.0000 mg | ORAL_TABLET | Freq: Three times a day (TID) | ORAL | 0 refills | Status: AC | PRN

## 2022-05-24 NOTE — Assessment & Plan Note (Signed)
New. Having severe pain. Will rx with medrol dose pak, has muscle relaxer for prn use.  Rx with short course of prn tramadol- pt advised no driving after taking.
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
## 2022-06-22 ENCOUNTER — Ambulatory Visit: Payer: Medicare Other | Admitting: Family Medicine

## 2022-07-01 ENCOUNTER — Other Ambulatory Visit: Payer: Self-pay | Admitting: Family Medicine

## 2022-07-09 ENCOUNTER — Other Ambulatory Visit: Payer: Self-pay | Admitting: Family Medicine

## 2022-07-22 IMAGING — MG MM DIGITAL SCREENING BILAT W/ TOMO AND CAD
8 series · 8 of 24 positions shown · non-contrast
Comparison: Previous exam(s).

CLINICAL DATA: Screening.

EXAM:
DIGITAL SCREENING BILATERAL MAMMOGRAM WITH TOMOSYNTHESIS AND CAD
TECHNIQUE: Bilateral screening digital craniocaudal and mediolateral oblique
mammograms were obtained. Bilateral screening digital breast
tomosynthesis was performed. The images were evaluated with
computer-aided detection.

[R MLO synth-2D]
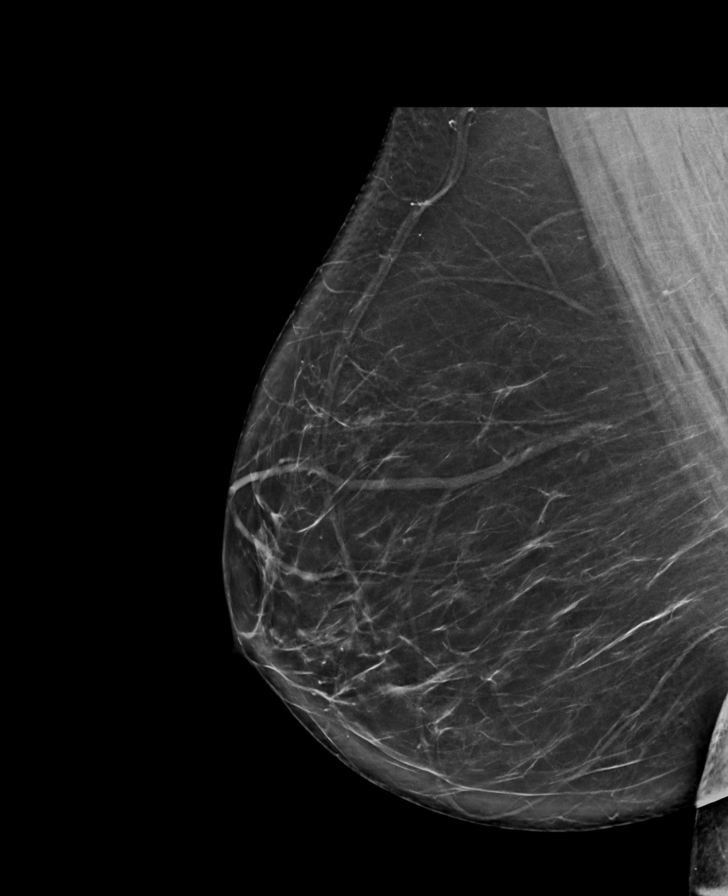

[L CC synth-2D]
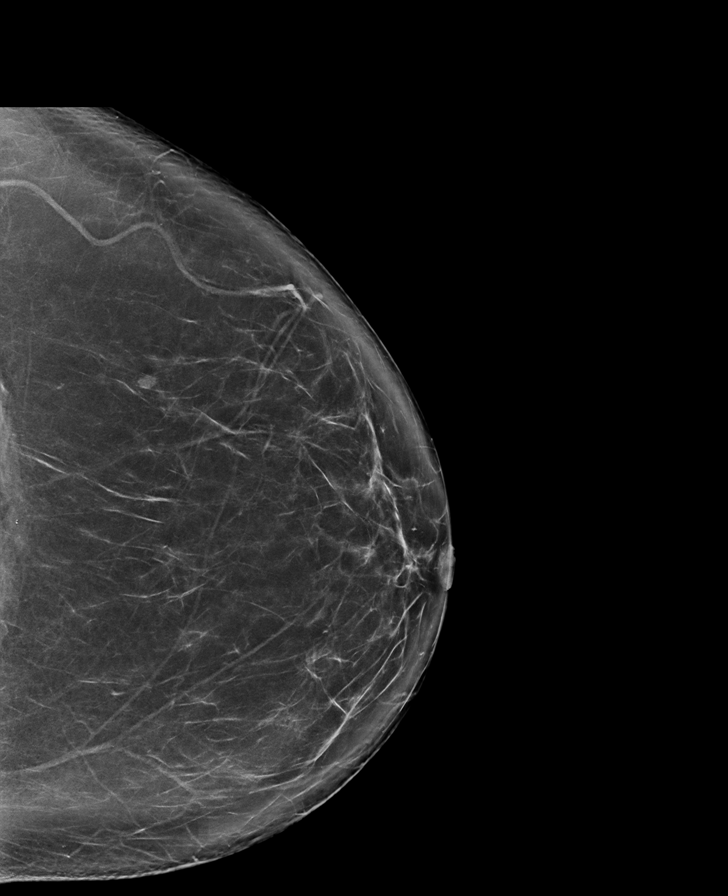

[R CC synth-2D]
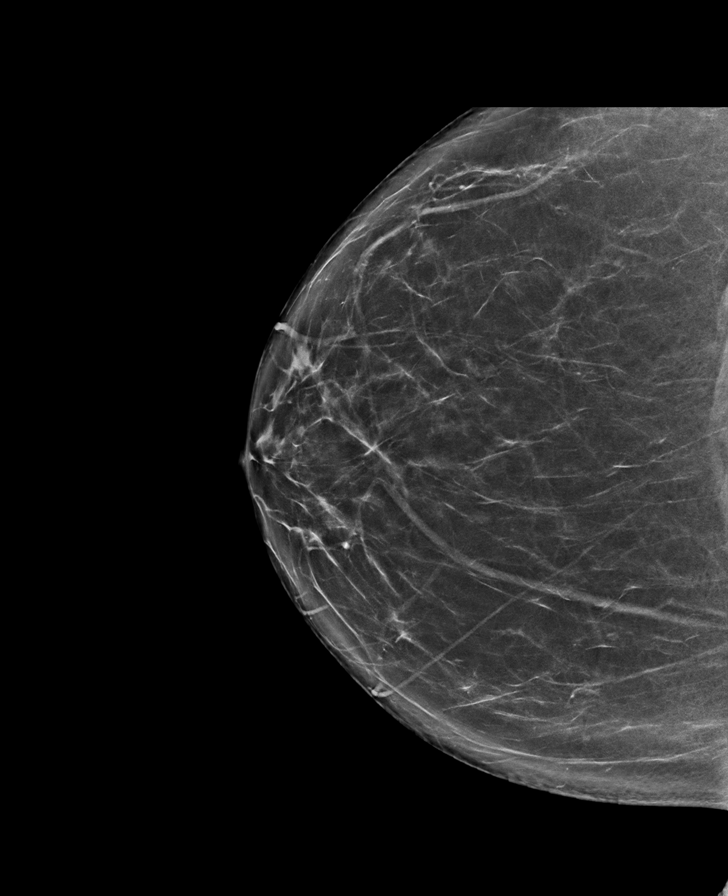

[L MLO synth-2D]
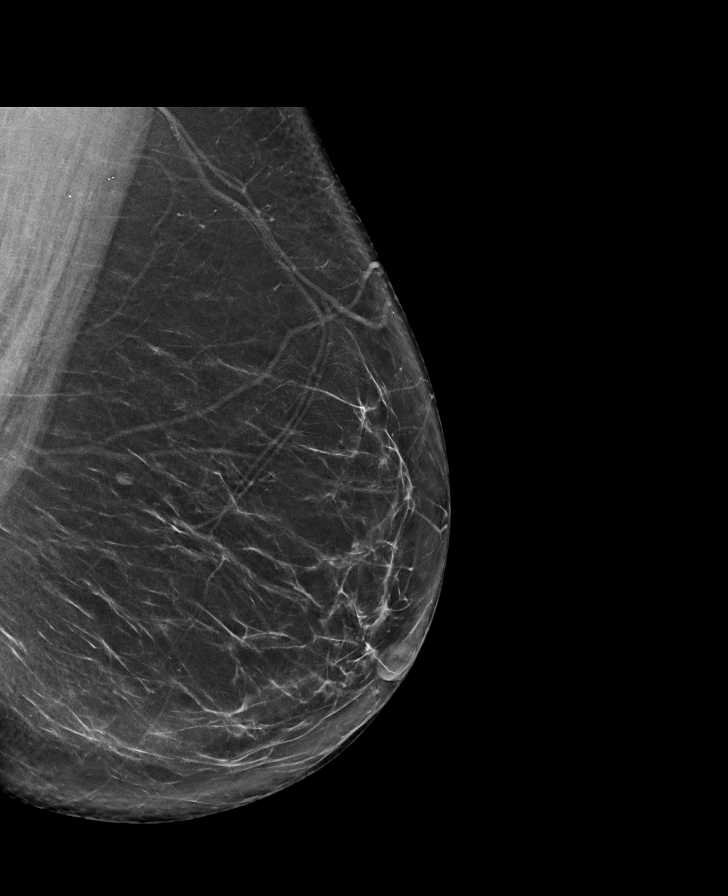

[L MLO tomo · tomo slice 46/91.0]
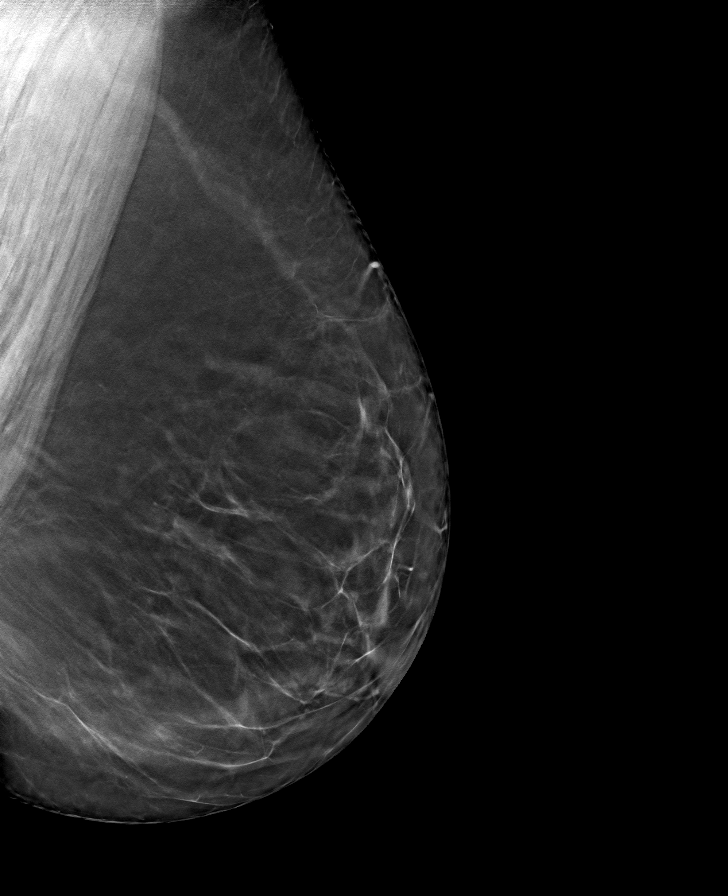

[L CC tomo · tomo slice 45/90.0]
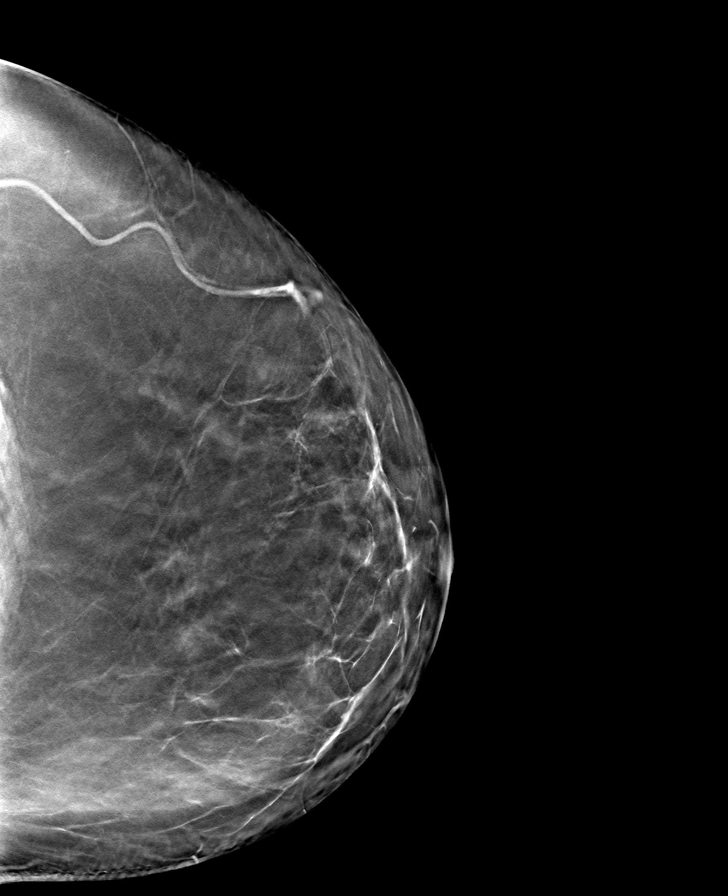

[R MLO tomo · tomo slice 45/89.0]
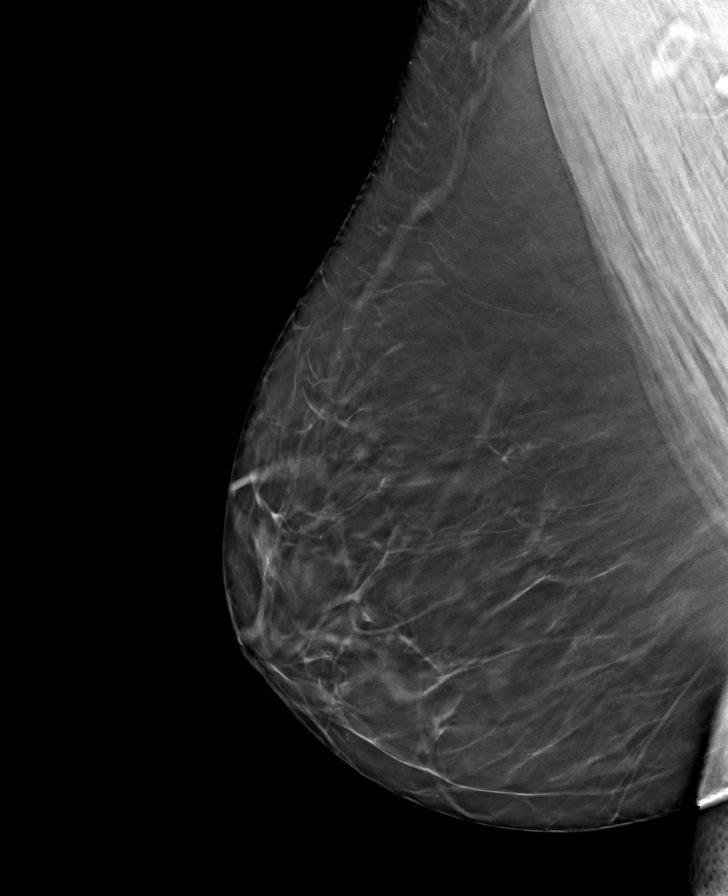

[R CC tomo · tomo slice 43/84.0]
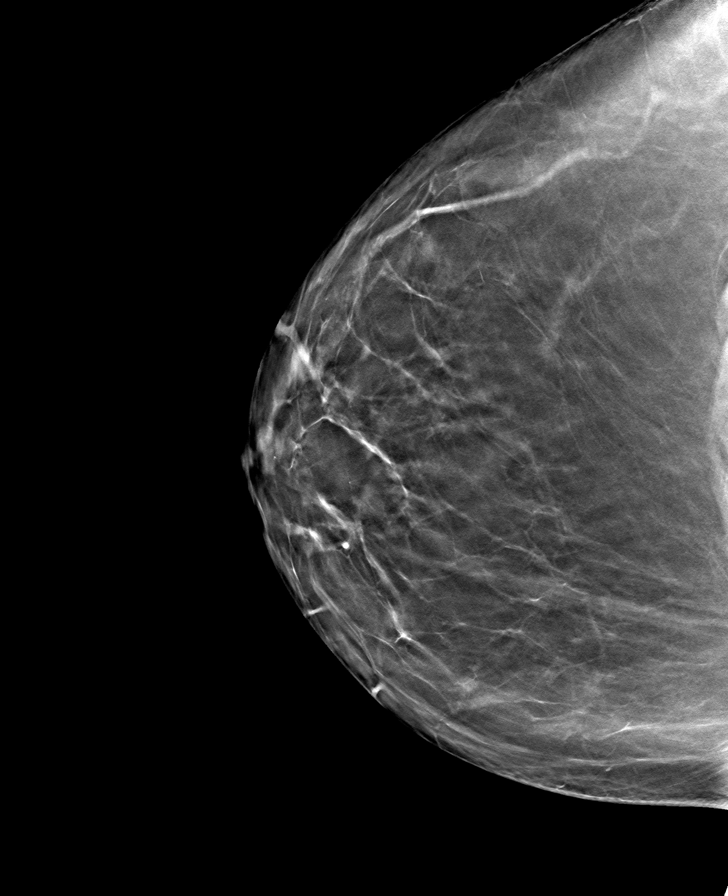

[8 of 24 positions shown; findings below may reference images not displayed]

ACR Breast Density Category b: There are scattered areas of
fibroglandular density.
FINDINGS: There are no findings suspicious for malignancy.
IMPRESSION: No mammographic evidence of malignancy. A result letter of this
screening mammogram will be mailed directly to the patient.

RECOMMENDATION:
Screening mammogram in one year. (Code:51-O-LD2)

BI-RADS CATEGORY  1: Negative.

## 2022-09-05 ENCOUNTER — Other Ambulatory Visit: Payer: Self-pay | Admitting: Family Medicine

## 2022-09-05 DIAGNOSIS — Z1231 Encounter for screening mammogram for malignant neoplasm of breast: Secondary | ICD-10-CM

## 2022-09-24 NOTE — Assessment & Plan Note (Signed)
Stable on current meds, follows with psychiatry

## 2022-09-24 NOTE — Assessment & Plan Note (Signed)
hgba1c acceptable, minimize simple carbs. Increase exercise as tolerated.  

## 2022-09-24 NOTE — Assessment & Plan Note (Addendum)
Encouraged DASH or MIND diet, decrease po intake and increase exercise as tolerated. Needs 7-8 hours of sleep nightly. Avoid trans fats, eat small, frequent meals every 4-5 hours with lean proteins, complex carbs and healthy fats. Minimize simple carbs, high fat foods and processed foods 

## 2022-09-24 NOTE — Assessment & Plan Note (Signed)
Encourage heart healthy diet such as MIND or DASH diet, increase exercise, avoid trans fats, simple carbohydrates and processed foods, consider a krill or fish or flaxseed oil cap daily.  °

## 2022-09-25 ENCOUNTER — Encounter: Payer: Self-pay | Admitting: Family Medicine

## 2022-09-25 ENCOUNTER — Telehealth: Payer: Self-pay | Admitting: Family Medicine

## 2022-09-25 ENCOUNTER — Telehealth (INDEPENDENT_AMBULATORY_CARE_PROVIDER_SITE_OTHER): Payer: Medicare Other | Admitting: Family Medicine

## 2022-09-25 DIAGNOSIS — E6609 Other obesity due to excess calories: Secondary | ICD-10-CM | POA: Diagnosis not present

## 2022-09-25 DIAGNOSIS — F3110 Bipolar disorder, current episode manic without psychotic features, unspecified: Secondary | ICD-10-CM | POA: Diagnosis not present

## 2022-09-25 DIAGNOSIS — R739 Hyperglycemia, unspecified: Secondary | ICD-10-CM | POA: Diagnosis not present

## 2022-09-25 DIAGNOSIS — E782 Mixed hyperlipidemia: Secondary | ICD-10-CM | POA: Diagnosis not present

## 2022-09-25 DIAGNOSIS — S0590XA Unspecified injury of unspecified eye and orbit, initial encounter: Secondary | ICD-10-CM

## 2022-09-25 MED ORDER — BACITRACIN-POLYMYXIN B 500-10000 UNIT/GM OP OINT: 1.0000 | TOPICAL_OINTMENT | Freq: Three times a day (TID) | OPHTHALMIC | 0 refills | Status: DC

## 2022-09-25 NOTE — Telephone Encounter (Signed)
Called patient to change to mychart//reschedule this morning since Phyllis Washington is out sick  Patient being talking loudly over the phone and complaining on how she hasn't seen Phyllis Washington in a while and it seems like every single appointment she has gets canceled. States her daughter can be seen and seems to never get reschedule or when she does she gets worked back in. Patient states that nothing ever seems fair when it comes to being seen  Patient at first declined VV because she wanted to talk about her eye. (Stated that blyth couldn't see anything thru the camera) I let patient know I could send a message back & see if there was a day/time we could possibly get her in but I was not aloud to just schedule an appt without knowing her day to day schedule & that the first appointment "green/availably" on my end was May 23rd. Patient stated that when we call to reschedule we should have already had something in place to make up for this. I let patient know we could get her in with NP but she was not happy with that. She states Phyllis Washington is PCP and she should be able to be seen by her own provider and not dishes out or pushed to the side everytime she resched. Patient stated that this appointment was reschedule once before & its getting out of hand.   Patient stated that she wanted to keep the VV to discuss "this mess" with Phyllis Washington & that she wanted to be put down for the May appointment as well.

## 2022-09-25 NOTE — Assessment & Plan Note (Signed)
She was camping yesterday when a small piece of wood flew up to her right eye. It was painful but has not gotten more painful. Just irritated. No bleeding, swelling or visual changes. She is given a prescription for polysporin to use tid and advised that if she develops worsening pain or visual changes she should seek immediate care
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
## 2022-10-02 ENCOUNTER — Other Ambulatory Visit (INDEPENDENT_AMBULATORY_CARE_PROVIDER_SITE_OTHER): Payer: Medicare Other

## 2022-10-02 ENCOUNTER — Other Ambulatory Visit: Payer: Self-pay

## 2022-10-02 ENCOUNTER — Telehealth: Payer: Self-pay | Admitting: Family Medicine

## 2022-10-02 DIAGNOSIS — E782 Mixed hyperlipidemia: Secondary | ICD-10-CM | POA: Diagnosis not present

## 2022-10-02 DIAGNOSIS — R739 Hyperglycemia, unspecified: Secondary | ICD-10-CM | POA: Diagnosis not present

## 2022-10-02 DIAGNOSIS — D649 Anemia, unspecified: Secondary | ICD-10-CM

## 2022-10-02 LAB — CBC WITH DIFFERENTIAL/PLATELET
Basophils Absolute: 0 10*3/uL (ref 0.0–0.1)
Basophils Relative: 0.5 % (ref 0.0–3.0)
Eosinophils Absolute: 0.1 10*3/uL (ref 0.0–0.7)
Eosinophils Relative: 1.4 % (ref 0.0–5.0)
HCT: 38.6 % (ref 36.0–46.0)
Hemoglobin: 13.2 g/dL (ref 12.0–15.0)
Lymphocytes Relative: 22.1 % (ref 12.0–46.0)
Lymphs Abs: 2.2 10*3/uL (ref 0.7–4.0)
MCHC: 34.3 g/dL (ref 30.0–36.0)
MCV: 89.5 fl (ref 78.0–100.0)
Monocytes Absolute: 0.4 10*3/uL (ref 0.1–1.0)
Monocytes Relative: 4 % (ref 3.0–12.0)
Neutro Abs: 7.1 10*3/uL (ref 1.4–7.7)
Neutrophils Relative %: 72 % (ref 43.0–77.0)
Platelets: 361 10*3/uL (ref 150.0–400.0)
RBC: 4.31 Mil/uL (ref 3.87–5.11)
RDW: 12.9 % (ref 11.5–15.5)
WBC: 9.9 10*3/uL (ref 4.0–10.5)

## 2022-10-02 LAB — LIPID PANEL
Cholesterol: 222 mg/dL — ABNORMAL HIGH (ref 0–200)
HDL: 40.9 mg/dL (ref >39.00)
NonHDL: 181.23
Total CHOL/HDL Ratio: 5
Triglycerides: 211 mg/dL — ABNORMAL HIGH (ref 0.0–149.0)
VLDL: 42.2 mg/dL — ABNORMAL HIGH (ref 0.0–40.0)

## 2022-10-02 LAB — LDL CHOLESTEROL, DIRECT: Direct LDL: 138 mg/dL

## 2022-10-02 LAB — TSH: TSH: 2.61 u[IU]/mL (ref 0.35–5.50)

## 2022-10-02 LAB — HEMOGLOBIN A1C: Hgb A1c MFr Bld: 5.6 % (ref 4.6–6.5)

## 2022-10-02 NOTE — Telephone Encounter (Signed)
Pt called stating that Dr. Abner Greenspan was going to put labs in to be done off of her last VV. After reviewing chart, advised pt that there was not any active order for labs in her chart and a note would be sent back to look into this. Pt stated that she was going to be in the area today and wanted to see if they could be put in today.

## 2022-10-02 NOTE — Telephone Encounter (Signed)
Called pt to help @ labs, Pt was very rude, spoke ugly about  Provider how she is getting tried of her not doing her job, that she had a week to get it done.  I was able to add pt to lab schedule and lab order was placed.

## 2022-10-13 ENCOUNTER — Ambulatory Visit (INDEPENDENT_AMBULATORY_CARE_PROVIDER_SITE_OTHER): Payer: Medicare Other | Admitting: *Deleted

## 2022-10-13 DIAGNOSIS — Z0000 Encounter for general adult medical examination without abnormal findings: Secondary | ICD-10-CM | POA: Diagnosis not present

## 2022-10-13 NOTE — Patient Instructions (Signed)
Phyllis Washington , Thank you for taking time to come for your Medicare Wellness Visit. I appreciate your ongoing commitment to your health goals. Please review the following plan we discussed and let me know if I can assist you in the future.     This is a list of the screening recommended for you and due dates:  Health Maintenance  Topic Date Due   Hepatitis C Screening: USPSTF Recommendation to screen - Ages 72-44 yo.  Never done   DTaP/Tdap/Td vaccine (2 - Td or Tdap) 04/23/2023   Medicare Annual Wellness Visit  10/13/2023   Pap Smear  09/14/2024   HIV Screening  Completed   HPV Vaccine  Aged Out   Flu Shot  Discontinued   COVID-19 Vaccine  Discontinued     Next appointment: Follow up in one year for your annual wellness visit.   Preventive Care 40-64 Years, Female Preventive care refers to lifestyle choices and visits with your health care provider that can promote health and wellness. What does preventive care include? A yearly physical exam. This is also called an annual well check. Dental exams once or twice a year. Routine eye exams. Ask your health care provider how often you should have your eyes checked. Personal lifestyle choices, including: Daily care of your teeth and gums. Regular physical activity. Eating a healthy diet. Avoiding tobacco and drug use. Limiting alcohol use. Practicing safe sex. Taking low-dose aspirin daily starting at age 47. Taking vitamin and mineral supplements as recommended by your health care provider. What happens during an annual well check? The services and screenings done by your health care provider during your annual well check will depend on your age, overall health, lifestyle risk factors, and family history of disease. Counseling  Your health care provider may ask you questions about your: Alcohol use. Tobacco use. Drug use. Emotional well-being. Home and relationship well-being. Sexual activity. Eating habits. Work and work  Astronomer. Method of birth control. Menstrual cycle. Pregnancy history. Screening  You may have the following tests or measurements: Height, weight, and BMI. Blood pressure. Lipid and cholesterol levels. These may be checked every 5 years, or more frequently if you are over 30 years old. Skin check. Lung cancer screening. You may have this screening every year starting at age 5 if you have a 30-pack-year history of smoking and currently smoke or have quit within the past 15 years. Fecal occult blood test (FOBT) of the stool. You may have this test every year starting at age 57. Flexible sigmoidoscopy or colonoscopy. You may have a sigmoidoscopy every 5 years or a colonoscopy every 10 years starting at age 54. Hepatitis C blood test. Hepatitis B blood test. Sexually transmitted disease (STD) testing. Diabetes screening. This is done by checking your blood sugar (glucose) after you have not eaten for a while (fasting). You may have this done every 1-3 years. Mammogram. This may be done every 1-2 years. Talk to your health care provider about when you should start having regular mammograms. This may depend on whether you have a family history of breast cancer. BRCA-related cancer screening. This may be done if you have a family history of breast, ovarian, tubal, or peritoneal cancers. Pelvic exam and Pap test. This may be done every 3 years starting at age 74. Starting at age 36, this may be done every 5 years if you have a Pap test in combination with an HPV test. Bone density scan. This is done to screen for osteoporosis. You  may have this scan if you are at high risk for osteoporosis. Discuss your test results, treatment options, and if necessary, the need for more tests with your health care provider. Vaccines  Your health care provider may recommend certain vaccines, such as: Influenza vaccine. This is recommended every year. Tetanus, diphtheria, and acellular pertussis (Tdap, Td)  vaccine. You may need a Td booster every 10 years. Zoster vaccine. You may need this after age 49. Pneumococcal 13-valent conjugate (PCV13) vaccine. You may need this if you have certain conditions and were not previously vaccinated. Pneumococcal polysaccharide (PPSV23) vaccine. You may need one or two doses if you smoke cigarettes or if you have certain conditions. Talk to your health care provider about which screenings and vaccines you need and how often you need them. This information is not intended to replace advice given to you by your health care provider. Make sure you discuss any questions you have with your health care provider. Document Released: 07/09/2015 Document Revised: 03/01/2016 Document Reviewed: 04/13/2015 Elsevier Interactive Patient Education  2017 [employer].    Fall Prevention in the Home Falls can cause injuries. They can happen to people of all ages. There are many things you can do to make your home safe and to help prevent falls. What can I do on the outside of my home? Regularly fix the edges of walkways and driveways and fix any cracks. Remove anything that might make you trip as you walk through a door, such as a raised step or threshold. Trim any bushes or trees on the path to your home. Use bright outdoor lighting. Clear any walking paths of anything that might make someone trip, such as rocks or tools. Regularly check to see if handrails are loose or broken. Make sure that both sides of any steps have handrails. Any raised decks and porches should have guardrails on the edges. Have any leaves, snow, or ice cleared regularly. Use sand or salt on walking paths during winter. Clean up any spills in your garage right away. This includes oil or grease spills. What can I do in the bathroom? Use night lights. Install grab bars by the toilet and in the tub and shower. Do not use towel bars as grab bars. Use non-skid mats or decals in the tub or shower. If you  need to sit down in the shower, use a plastic, non-slip stool. Keep the floor dry. Clean up any water that spills on the floor as soon as it happens. Remove soap buildup in the tub or shower regularly. Attach bath mats securely with double-sided non-slip rug tape. Do not have throw rugs and other things on the floor that can make you trip. What can I do in the bedroom? Use night lights. Make sure that you have a light by your bed that is easy to reach. Do not use any sheets or blankets that are too big for your bed. They should not hang down onto the floor. Have a firm chair that has side arms. You can use this for support while you get dressed. Do not have throw rugs and other things on the floor that can make you trip. What can I do in the kitchen? Clean up any spills right away. Avoid walking on wet floors. Keep items that you use a lot in easy-to-reach places. If you need to reach something above you, use a strong step stool that has a grab bar. Keep electrical cords out of the way. Do not use floor  polish or wax that makes floors slippery. If you must use wax, use non-skid floor wax. Do not have throw rugs and other things on the floor that can make you trip. What can I do with my stairs? Do not leave any items on the stairs. Make sure that there are handrails on both sides of the stairs and use them. Fix handrails that are broken or loose. Make sure that handrails are as long as the stairways. Check any carpeting to make sure that it is firmly attached to the stairs. Fix any carpet that is loose or worn. Avoid having throw rugs at the top or bottom of the stairs. If you do have throw rugs, attach them to the floor with carpet tape. Make sure that you have a light switch at the top of the stairs and the bottom of the stairs. If you do not have them, ask someone to add them for you. What else can I do to help prevent falls? Wear shoes that: Do not have high heels. Have rubber  bottoms. Are comfortable and fit you well. Are closed at the toe. Do not wear sandals. If you use a stepladder: Make sure that it is fully opened. Do not climb a closed stepladder. Make sure that both sides of the stepladder are locked into place. Ask someone to hold it for you, if possible. Clearly mark and make sure that you can see: Any grab bars or handrails. First and last steps. Where the edge of each step is. Use tools that help you move around (mobility aids) if they are needed. These include: Canes. Walkers. Scooters. Crutches. Turn on the lights when you go into a dark area. Replace any light bulbs as soon as they burn out. Set up your furniture so you have a clear path. Avoid moving your furniture around. If any of your floors are uneven, fix them. If there are any pets around you, be aware of where they are. Review your medicines with your doctor. Some medicines can make you feel dizzy. This can increase your chance of falling. Ask your doctor what other things that you can do to help prevent falls. This information is not intended to replace advice given to you by your health care provider. Make sure you discuss any questions you have with your health care provider. Document Released: 04/08/2009 Document Revised: 11/18/2015 Document Reviewed: 07/17/2014 Elsevier Interactive Patient Education  2017 [employer].
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
## 2022-10-20 ENCOUNTER — Ambulatory Visit
Admission: RE | Admit: 2022-10-20 | Discharge: 2022-10-20 | Disposition: A | Discharge status: Home / Self Care | Payer: Medicare Other | Source: Ambulatory Visit | Attending: Family Medicine | Admitting: Family Medicine

## 2022-10-20 DIAGNOSIS — Z1231 Encounter for screening mammogram for malignant neoplasm of breast: Secondary | ICD-10-CM

## 2022-11-15 NOTE — Assessment & Plan Note (Signed)
Follows with [facility]

## 2022-11-15 NOTE — Assessment & Plan Note (Signed)
Encouraged DASH or MIND diet, decrease po intake and increase exercise as tolerated. Needs 7-8 hours of sleep nightly. Avoid trans fats, eat small, frequent meals every 4-5 hours with lean proteins, complex carbs and healthy fats. Minimize simple carbs, high fat foods and processed foods 

## 2022-11-15 NOTE — Assessment & Plan Note (Signed)
hgba1c acceptable, minimize simple carbs. Increase exercise as tolerated.  

## 2022-11-15 NOTE — Assessment & Plan Note (Signed)
Encourage heart healthy diet such as MIND or DASH diet, increase exercise, avoid trans fats, simple carbohydrates and processed foods, consider a krill or fish or flaxseed oil cap daily.  °

## 2022-11-16 ENCOUNTER — Ambulatory Visit (INDEPENDENT_AMBULATORY_CARE_PROVIDER_SITE_OTHER): Payer: Medicare Other | Admitting: Family Medicine

## 2022-11-16 VITALS — BP 122/76 | HR 82 | Temp 98.0°F | Resp 16 | Ht 67.0 in | Wt 221.4 lb

## 2022-11-16 DIAGNOSIS — E782 Mixed hyperlipidemia: Secondary | ICD-10-CM

## 2022-11-16 DIAGNOSIS — F3110 Bipolar disorder, current episode manic without psychotic features, unspecified: Secondary | ICD-10-CM | POA: Diagnosis not present

## 2022-11-16 DIAGNOSIS — R739 Hyperglycemia, unspecified: Secondary | ICD-10-CM | POA: Diagnosis not present

## 2022-11-16 DIAGNOSIS — L578 Other skin changes due to chronic exposure to nonionizing radiation: Secondary | ICD-10-CM

## 2022-11-16 DIAGNOSIS — E6609 Other obesity due to excess calories: Secondary | ICD-10-CM | POA: Diagnosis not present

## 2022-11-16 DIAGNOSIS — L989 Disorder of the skin and subcutaneous tissue, unspecified: Secondary | ICD-10-CM

## 2022-11-16 LAB — COMPREHENSIVE METABOLIC PANEL
ALT: 17 U/L (ref 0–35)
AST: 17 U/L (ref 0–37)
Albumin: 4.2 g/dL (ref 3.5–5.2)
Alkaline Phosphatase: 69 U/L (ref 39–117)
BUN: 11 mg/dL (ref 6–23)
CO2: 26 mEq/L (ref 19–32)
Calcium: 9.2 mg/dL (ref 8.4–10.5)
Chloride: 104 mEq/L (ref 96–112)
Creatinine, Ser: 0.82 mg/dL (ref 0.40–1.20)
GFR: 87.16 mL/min (ref >60.00)
Glucose, Bld: 98 mg/dL (ref 70–99)
Potassium: 4.2 mEq/L (ref 3.5–5.1)
Sodium: 138 mEq/L (ref 135–145)
Total Bilirubin: 0.4 mg/dL (ref 0.2–1.2)
Total Protein: 6.6 g/dL (ref 6.0–8.3)
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
## 2022-11-16 NOTE — Patient Instructions (Signed)
Skin Tag, Adult A skin tag (acrochordon) is a soft, extra growth of skin. Most skin tags are skin-colored and rarely bigger than a pencil eraser. They often form in areas where there is frequent rubbing, or friction, on the skin. This may be where there are folds in the skin, such as: The eyelids. The neck. The armpits. The groin. Skin tags are not dangerous, and they do not spread from person to person (are not contagious). You may have one skin tag or many. Skin tags do not need treatment. However, your health care provider may recommend removing a skin tag if it: Gets irritated from clothing or jewelry. Bleeds. Is visible and unsightly. What are the causes? This condition is linked to: Increasing age. Pregnancy. Diabetes. Obesity. What are the signs or symptoms? Skin tags usually do not cause symptoms unless they get irritated by items touching your skin, such as clothing or jewelry. When this happens, you may have pain, itching, or bleeding. How is this diagnosed? This condition is diagnosed with an evaluation from your health care provider. No testing is needed for diagnosis. How is this treated? Treatment for this condition depends on whether you have symptoms. Your health care provider may also remove your skin tag if it is visible or if you do not like the way it looks. A skin tag can be removed by a health care provider with: A simple surgical procedure using scissors. A procedure that involves freezing your skin tag with a gas in liquid form (liquid nitrogen). A procedure that uses heat to destroy your skin tag (electrodessication). Follow these instructions at home: Watch for any changes in your skin tag. A normal skin tag does not require any other special care at home. Take over-the-counter and prescription medicines only as told by your health care provider. Keep all follow-up visits. Contact a health care provider if: You have a skin tag that: Becomes painful. Changes  color. Bleeds. Swells. Summary Skin tags are soft, extra growths of skin found in areas of frequent rubbing or friction. Skin tags usually do not cause symptoms. If symptoms occur, you may have pain, itching, or bleeding. Your health care provider may remove your skin tag if it causes symptoms or if you do not like the way it looks. This information is not intended to replace advice given to you by your health care provider. Make sure you discuss any questions you have with your health care provider. Document Revised: 07/27/2021 Document Reviewed: 07/27/2021 Elsevier Patient Education  2024 [employer].

## 2022-11-20 ENCOUNTER — Encounter: Payer: Self-pay | Admitting: Family Medicine

## 2022-11-20 DIAGNOSIS — L578 Other skin changes due to chronic exposure to nonionizing radiation: Secondary | ICD-10-CM | POA: Insufficient documentation

## 2022-11-20 NOTE — Assessment & Plan Note (Signed)
And lesion on face just lateral to nose on left. Referred to dermatology for further evaluation

## 2023-01-18 ENCOUNTER — Encounter: Payer: Self-pay | Admitting: Family Medicine

## 2023-01-24 ENCOUNTER — Encounter (INDEPENDENT_AMBULATORY_CARE_PROVIDER_SITE_OTHER): Payer: Self-pay

## 2023-01-27 ENCOUNTER — Encounter: Payer: Self-pay | Admitting: Family Medicine

## 2023-02-06 ENCOUNTER — Other Ambulatory Visit: Payer: Self-pay | Admitting: Family Medicine

## 2023-02-18 NOTE — Assessment & Plan Note (Signed)
hgba1c acceptable, minimize simple carbs. Increase exercise as tolerated.  

## 2023-02-18 NOTE — Assessment & Plan Note (Signed)
Continues to follow with [facility]

## 2023-02-18 NOTE — Assessment & Plan Note (Signed)
Encourage heart healthy diet such as MIND or DASH diet, increase exercise, avoid trans fats, simple carbohydrates and processed foods, consider a krill or fish or flaxseed oil cap daily.  °

## 2023-02-18 NOTE — Assessment & Plan Note (Signed)
Continue to monitor

## 2023-02-19 ENCOUNTER — Encounter: Payer: Self-pay | Admitting: Family Medicine

## 2023-02-19 ENCOUNTER — Ambulatory Visit (INDEPENDENT_AMBULATORY_CARE_PROVIDER_SITE_OTHER): Payer: Medicare Other | Admitting: Family Medicine

## 2023-02-19 VITALS — BP 128/80 | HR 68 | Temp 98.0°F | Resp 16 | Ht 67.0 in | Wt 226.4 lb

## 2023-02-19 DIAGNOSIS — R739 Hyperglycemia, unspecified: Secondary | ICD-10-CM | POA: Diagnosis not present

## 2023-02-19 DIAGNOSIS — E782 Mixed hyperlipidemia: Secondary | ICD-10-CM

## 2023-02-19 DIAGNOSIS — F3110 Bipolar disorder, current episode manic without psychotic features, unspecified: Secondary | ICD-10-CM | POA: Diagnosis not present

## 2023-02-19 DIAGNOSIS — R946 Abnormal results of thyroid function studies: Secondary | ICD-10-CM

## 2023-02-19 NOTE — Patient Instructions (Signed)
 Carbohydrate Counting Carbohydrate counting is a method of keeping track of how many carbohydrates you eat. Eating carbohydrates increases the amount of sugar (glucose) in the blood. Counting how many carbohydrates you eat improves how well you manage your blood glucose. This, in turn, helps you manage your diabetes. Carbohydrates are measured in grams (g) per serving. It is important to know how many carbohydrates (in grams or by serving size) you can have in each meal. This is different for every person. A dietitian can help you make a meal plan and calculate how many carbohydrates you should have at each meal and snack. What foods contain carbohydrates? Carbohydrates are found in the following foods: Grains, such as breads and cereals. Dried beans and soy products. Starchy vegetables, such as potatoes, peas, and corn. Fruit and fruit juices. Milk and yogurt. Sweets and snack foods, such as cake, cookies, candy, chips, and soft drinks. How do I count carbohydrates in foods? There are two ways to count carbohydrates in food. You can read food labels or learn standard serving sizes of foods. You can use either of these methods or a combination of both. Using the Nutrition Facts label The Nutrition Facts list is included on the labels of almost all packaged foods and beverages in the [place]. It includes: The serving size. Information about nutrients in each serving, including the grams of carbohydrate per serving. To use the Nutrition Facts, decide how many servings you will have. Then, multiply the number of servings by the number of carbohydrates per serving. The resulting number is the total grams of carbohydrates that you will be having. Learning the standard serving sizes of foods When you eat carbohydrate foods that are not packaged or do not include Nutrition Facts on the label, you need to measure the servings in order to count the grams of carbohydrates. Measure the foods that you  will eat with a food scale or measuring cup, if needed. Decide how many standard-size servings you will eat. Multiply the number of servings by 15. For foods that contain carbohydrates, one serving equals 15 g of carbohydrates. For example, if you eat 2 cups or 10 oz (300 g) of strawberries, you will have eaten 2 servings and 30 g of carbohydrates (2 servings x 15 g = 30 g). For foods that have more than one food mixed, such as soups and casseroles, you must count the carbohydrates in each food that is included. The following list contains standard serving sizes of common carbohydrate-rich foods. Each of these servings has about 15 g of carbohydrates: 1 slice of bread. 1 six-inch (15 cm) tortilla. ? cup or 2 oz (53 g) cooked rice or pasta.  cup or 3 oz (85 g) cooked or canned, drained and rinsed beans or lentils.  cup or 3 oz (85 g) starchy vegetable, such as peas, corn, or squash.  cup or 4 oz (120 g) hot cereal.  cup or 3 oz (85 g) boiled or mashed potatoes, or  or 3 oz (85 g) of a large baked potato.  cup or 4 fl oz (118 mL) fruit juice. 1 cup or 8 fl oz (237 mL) milk. 1 small or 4 oz (106 g) apple.  or 2 oz (63 g) of a medium banana. 1 cup or 5 oz (150 g) strawberries. 3 cups or 1 oz (28.3 g) popped popcorn. What is an example of carbohydrate counting? To calculate the grams of carbohydrates in this sample meal, follow the steps shown below. Sample meal  3 oz (85 g) chicken breast. ? cup or 4 oz (106 g) brown rice.  cup or 3 oz (85 g) corn. 1 cup or 8 fl oz (237 mL) milk. 1 cup or 5 oz (150 g) strawberries with sugar-free whipped topping. Carbohydrate calculation Identify the foods that contain carbohydrates: Rice. Corn. Milk. Strawberries. Calculate how many servings you have of each food: 2 servings rice. 1 serving corn. 1 serving milk. 1 serving strawberries. Multiply each number of servings by 15 g: 2 servings rice x 15 g = 30 g. 1 serving corn x 15 g = 15 g. 1  serving milk x 15 g = 15 g. 1 serving strawberries x 15 g = 15 g. Add together all of the amounts to find the total grams of carbohydrates eaten: 30 g + 15 g + 15 g + 15 g = 75 g of carbohydrates total. What are tips for following this plan? Shopping Develop a meal plan and then make a shopping list. Buy fresh and frozen vegetables, fresh and frozen fruit, dairy, eggs, beans, lentils, and whole grains. Look at food labels. Choose foods that have more fiber and less sugar. Avoid processed foods and foods with added sugars. Meal planning Aim to have the same number of grams of carbohydrates at each meal and for each snack time. Plan to have regular, balanced meals and snacks. Where to find more information American Diabetes Association: diabetes.org Centers for Disease Control and Prevention: TonerPromos.no Academy of Nutrition and Dietetics: eatright.org Association of Diabetes Care & Education Specialists: diabeteseducator.org Summary Carbohydrate counting is a method of keeping track of how many carbohydrates you eat. Eating carbohydrates increases the amount of sugar (glucose) in your blood. Counting how many carbohydrates you eat improves how well you manage your blood glucose. This helps you manage your diabetes. A dietitian can help you make a meal plan and calculate how many carbohydrates you should have at each meal and snack. This information is not intended to replace advice given to you by your health care provider. Make sure you discuss any questions you have with your health care provider. Document Revised: 01/14/2020 Document Reviewed: 01/14/2020 Elsevier Patient Education  2024 [employer].
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
## 2023-02-19 NOTE — Addendum Note (Signed)
Addended by: Danise Edge A on: 02/19/2023 12:37 PM   Modules accepted: Orders

## 2023-02-20 LAB — COMPREHENSIVE METABOLIC PANEL
ALT: 14 U/L (ref 0–35)
AST: 17 U/L (ref 0–37)
Albumin: 4.1 g/dL (ref 3.5–5.2)
Alkaline Phosphatase: 66 U/L (ref 39–117)
BUN: 10 mg/dL (ref 6–23)
CO2: 25 meq/L (ref 19–32)
Calcium: 9.2 mg/dL (ref 8.4–10.5)
Chloride: 105 meq/L (ref 96–112)
Creatinine, Ser: 0.78 mg/dL (ref 0.40–1.20)
GFR: 92.38 mL/min (ref >60.00)
Glucose, Bld: 92 mg/dL (ref 70–99)
Potassium: 4.7 mEq/L (ref 3.5–5.1)
Sodium: 138 mEq/L (ref 135–145)
Total Bilirubin: 0.4 mg/dL (ref 0.2–1.2)
Total Protein: 6.4 g/dL (ref 6.0–8.3)

## 2023-02-20 LAB — LIPID PANEL
Cholesterol: 193 mg/dL (ref 0–200)
HDL: 44 mg/dL (ref >39.00)
LDL Cholesterol: 134 mg/dL — ABNORMAL HIGH (ref 0–99)
NonHDL: 148.73
Total CHOL/HDL Ratio: 4
Triglycerides: 76 mg/dL (ref 0.0–149.0)
VLDL: 15.2 mg/dL (ref 0.0–40.0)

## 2023-02-20 LAB — HEMOGLOBIN A1C: Hgb A1c MFr Bld: 5.6 % (ref 4.6–6.5)

## 2023-02-20 LAB — TSH: TSH: 3.05 u[IU]/mL (ref 0.35–5.50)

## 2023-03-25 ENCOUNTER — Other Ambulatory Visit: Payer: Self-pay | Admitting: Family Medicine

## 2023-03-26 MED ORDER — TIZANIDINE HCL 4 MG PO TABS: 4.0000 mg | ORAL_TABLET | Freq: Four times a day (QID) | ORAL | 0 refills | Status: DC | PRN

## 2023-05-27 NOTE — Assessment & Plan Note (Addendum)
Encourage heart healthy diet such as MIND or DASH diet, increase exercise, avoid trans fats, simple carbohydrates and processed foods, consider a krill or fish or flaxseed oil cap daily.  °

## 2023-05-27 NOTE — Assessment & Plan Note (Signed)
Continue to monitor

## 2023-05-27 NOTE — Assessment & Plan Note (Signed)
hgba1c acceptable, minimize simple carbs. Increase exercise as tolerated.  

## 2023-05-27 NOTE — Assessment & Plan Note (Signed)
Follows with [facility], no change to meds.

## 2023-05-29 ENCOUNTER — Other Ambulatory Visit (HOSPITAL_BASED_OUTPATIENT_CLINIC_OR_DEPARTMENT_OTHER): Payer: Self-pay

## 2023-05-29 ENCOUNTER — Ambulatory Visit: Payer: Medicare Other | Admitting: Family Medicine

## 2023-05-29 VITALS — BP 132/80 | HR 78 | Temp 98.3°F | Resp 18 | Ht 67.0 in | Wt 228.2 lb

## 2023-05-29 DIAGNOSIS — F3110 Bipolar disorder, current episode manic without psychotic features, unspecified: Secondary | ICD-10-CM

## 2023-05-29 DIAGNOSIS — F338 Other recurrent depressive disorders: Secondary | ICD-10-CM | POA: Insufficient documentation

## 2023-05-29 DIAGNOSIS — E782 Mixed hyperlipidemia: Secondary | ICD-10-CM | POA: Diagnosis not present

## 2023-05-29 DIAGNOSIS — R739 Hyperglycemia, unspecified: Secondary | ICD-10-CM

## 2023-05-29 DIAGNOSIS — R946 Abnormal results of thyroid function studies: Secondary | ICD-10-CM | POA: Diagnosis not present

## 2023-05-29 DIAGNOSIS — Z1159 Encounter for screening for other viral diseases: Secondary | ICD-10-CM

## 2023-05-29 LAB — COMPREHENSIVE METABOLIC PANEL
ALT: 15 U/L (ref 0–35)
AST: 14 U/L (ref 0–37)
Albumin: 4.1 g/dL (ref 3.5–5.2)
Alkaline Phosphatase: 68 U/L (ref 39–117)
BUN: 9 mg/dL (ref 6–23)
CO2: 27 meq/L (ref 19–32)
Calcium: 9.2 mg/dL (ref 8.4–10.5)
Chloride: 106 meq/L (ref 96–112)
Creatinine, Ser: 0.83 mg/dL (ref 0.40–1.20)
GFR: 85.58 mL/min (ref >60.00)
Glucose, Bld: 101 mg/dL — ABNORMAL HIGH (ref 70–99)
Potassium: 4.5 meq/L (ref 3.5–5.1)
Sodium: 137 meq/L (ref 135–145)
Total Bilirubin: 0.5 mg/dL (ref 0.2–1.2)
Total Protein: 6.4 g/dL (ref 6.0–8.3)

## 2023-05-29 LAB — CBC WITH DIFFERENTIAL/PLATELET
Basophils Absolute: 0.1 10*3/uL (ref 0.0–0.1)
Basophils Relative: 0.5 % (ref 0.0–3.0)
Eosinophils Absolute: 0.1 10*3/uL (ref 0.0–0.7)
Eosinophils Relative: 1 % (ref 0.0–5.0)
HCT: 37.9 % (ref 36.0–46.0)
Hemoglobin: 12.7 g/dL (ref 12.0–15.0)
Lymphocytes Relative: 20.3 % (ref 12.0–46.0)
Lymphs Abs: 2.2 10*3/uL (ref 0.7–4.0)
MCHC: 33.5 g/dL (ref 30.0–36.0)
MCV: 90.6 fL (ref 78.0–100.0)
Monocytes Absolute: 0.5 10*3/uL (ref 0.1–1.0)
Monocytes Relative: 4.2 % (ref 3.0–12.0)
Neutro Abs: 8.1 10*3/uL — ABNORMAL HIGH (ref 1.4–7.7)
Neutrophils Relative %: 74 % (ref 43.0–77.0)
Platelets: 315 10*3/uL (ref 150.0–400.0)
RBC: 4.19 Mil/uL (ref 3.87–5.11)
RDW: 13.4 % (ref 11.5–15.5)
WBC: 10.9 10*3/uL — ABNORMAL HIGH (ref 4.0–10.5)

## 2023-05-29 LAB — LIPID PANEL
Cholesterol: 180 mg/dL (ref 0–200)
HDL: 46.5 mg/dL (ref >39.00)
LDL Cholesterol: 115 mg/dL — ABNORMAL HIGH (ref 0–99)
NonHDL: 133.88
Total CHOL/HDL Ratio: 4
Triglycerides: 95 mg/dL (ref 0.0–149.0)
VLDL: 19 mg/dL (ref 0.0–40.0)

## 2023-05-29 LAB — HEMOGLOBIN A1C: Hgb A1c MFr Bld: 5.6 % (ref 4.6–6.5)

## 2023-05-29 LAB — TSH: TSH: 3.87 u[IU]/mL (ref 0.35–5.50)

## 2023-05-29 MED ORDER — BOOSTRIX 5-2.5-18.5 LF-MCG/0.5 IM SUSY
0.5000 mL | PREFILLED_SYRINGE | Freq: Once | INTRAMUSCULAR | 0 refills | Status: AC
  Filled 2023-05-29: qty 0.5, 1d supply, fill #0

## 2023-05-29 NOTE — Assessment & Plan Note (Signed)
Sees psychiatry and they increased

## 2023-05-29 NOTE — Patient Instructions (Addendum)
Tetanus booster downstairs.   Every one be nice and celebrate the holidays   [place] lights or Phillips lights for SAD   Seasonal Affective Disorder Seasonal affective disorder (SAD) is a type of depression. It is when you feel depressed at specific times of the year. SAD is most common during late fall and winter when the days are shorter and most people spend less time outdoors. This is why SAD is also known as the "winter blues." SAD occurs less commonly in the spring or summer, but if it does, it may be associated with overexposure to light or high pollen counts. SAD can be mild to severe, and it can interfere with work, school, relationships, and normal daily activities. What are the causes? The cause of this condition is not known. It may be related to changes in brain chemistry that are caused by having more or less exposure to daylight. What increases the risk? You are more likely to develop this condition if: You are female. You live far [place] or far [place] of the equator. These areas get less sunlight and have longer winter seasons. You have a personal history of depression or bipolar disorder. You have a family history of mental health conditions. What are the signs or symptoms? Symptoms of this condition include: Mood changes, such as: Feeling sad or teary. Having crying spells. Irritability. Feelings of guilt or worthlessness. Thinking about self-harm or attempting suicide. Physical changes, such as: Restlessness or loss of energy. Difficulty concentrating, remembering, or making decisions. A significant change in appetite or weight. Behavioral changes, such as: Trouble sleeping, or sleeping more than usual. Loss of interest in activities that you usually enjoy. Overeating or craving sweet foods. Avoiding social situations (social withdrawal), or feeling like "hibernating." Symptoms associated with the less common summer pattern of SAD include: Loss of  appetite. Weight loss. Trouble sleeping. Episodes of violent behavior (in severe cases). How is this diagnosed? This condition is usually diagnosed through an assessment with your health care provider. You will be asked about your moods, thoughts, and behaviors, and if you have noticed a seasonal pattern. You will also be asked about your medical history, any major life changes, and any medicines and substances that you use. You may have a physical exam and blood tests to rule out other possible causes of your symptoms. You may be referred to a mental health specialist for more evaluation. How is this treated? Treatment for this condition may include: Light therapy. This therapy involves sitting in front of a light source for 15-30 minutes every day. The light source may be: A light box designed specifically to treat SAD. A dawn simulator or sunrise clock. This is a timer-activated light source that copies the sunrise by slowly becoming brighter. This can help to activate your body's internal clock. Antidepressant medicine. Cognitive behavioral therapy (CBT). CBT is a form of talk therapy that helps to identify and change negative thoughts that are associated with SAD. Changes to your dietary, exercise, or sleeping habits. A healthy lifestyle may help to prevent or relieve symptoms. Follow these instructions at home: Medicines Take over-the-counter and prescription medicines only as told by your health care provider. If you are taking antidepressant medicines, ask your health care provider what side effects you should be aware of. Talk with your health care provider before you start taking any new prescription or over-the-counter medicines, herbs, or supplements. Lifestyle Eat a healthy diet that includes fruits and vegetables, whole grains, and lean proteins. Get plenty of  sleep. To improve your sleep, make sure you: Keep your bedroom dark and cool. Go to sleep and wake up at about the same  time every day. Do not keep screens, such as a TV or smartphone, in your bedroom. Limit your screen time starting a few hours before bedtime. Exercise regularly. Limit alcohol and caffeine as told by your health care provider. General instructions Attend CBT therapy sessions as directed. For fall and winter SAD only: Make your home and work environment as sunny or bright as possible. Open window blinds and move furniture closer to windows. Spend as much time outside as possible. Use light therapy for 15-30 minutes every day, or as often as directed. This should start in the early fall and continue until spring. If SAD happens during the summer, light treatment would not be recommended. Keep all follow-up visits. This is important. Where to find more information [employer] of Mental Health: http://www.maynard.net/ National Alliance on Mental Illness: www.nami.org Contact a health care provider if: Your symptoms do not get better or they get worse. You have trouble taking care of yourself. You are using drugs or alcohol to cope with your symptoms. You have side effects from medicines. Get help right away if: You have thoughts about hurting yourself or others. If you ever feel like you may hurt yourself or others, or have thoughts about taking your own life, get help right away. Go to your nearest emergency department or: Call your local emergency services (911 in the [place] Call a suicide crisis helpline, such as the National Suicide Prevention Lifeline at (602) 808-7387 or 988 in the [place] This is open 24 hours a day in the [place] Text the Crisis Text Line at 620-608-0955 (in the [place] Summary Seasonal affective disorder (SAD) is a type of depression that is associated with specific times of the year (usually fall and winter). This condition may be treated with light therapy, talk therapy, and antidepressant medicines. To help treat your condition, take good care of yourself and keep all  follow-up visits with your health care provider. Get help right away if you have thoughts about hurting yourself or others. This information is not intended to replace advice given to you by your health care provider. Make sure you discuss any questions you have with your health care provider. Document Revised: 01/05/2021 Document Reviewed: 09/30/2020 Elsevier Patient Education  2024 [employer].

## 2023-05-30 LAB — HEPATITIS C ANTIBODY: Hepatitis C Ab: NONREACTIVE
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
## 2023-06-29 ENCOUNTER — Other Ambulatory Visit: Payer: Self-pay | Admitting: Family Medicine

## 2023-06-29 ENCOUNTER — Encounter: Payer: Self-pay | Admitting: Family Medicine

## 2023-06-29 DIAGNOSIS — E6609 Other obesity due to excess calories: Secondary | ICD-10-CM

## 2023-06-29 DIAGNOSIS — R739 Hyperglycemia, unspecified: Secondary | ICD-10-CM

## 2023-06-29 DIAGNOSIS — E782 Mixed hyperlipidemia: Secondary | ICD-10-CM

## 2023-07-01 ENCOUNTER — Other Ambulatory Visit: Payer: Self-pay | Admitting: Family Medicine

## 2023-07-01 DIAGNOSIS — E6609 Other obesity due to excess calories: Secondary | ICD-10-CM

## 2023-07-11 NOTE — Telephone Encounter (Signed)
 Type Date User Summary Attachment  General 06/29/2023 11:38 AM Phyllis Washington L Left message for pt to call and schedule -  Note: Left message for pt to call and schedule  When I checked the status of the referral it has been closed. I will follow up with pt and let her know. She would like another referral placed since healthy weight and wellness was too expensive.

## 2023-07-11 NOTE — Telephone Encounter (Signed)
 Called patient and gave her the number to [facility]. She is going to reach back out and let us  know what they say

## 2023-07-24 ENCOUNTER — Encounter: Payer: Self-pay | Admitting: Dermatology

## 2023-07-24 ENCOUNTER — Ambulatory Visit: Payer: Medicare Other | Admitting: Dermatology

## 2023-07-24 VITALS — BP 131/81

## 2023-07-24 DIAGNOSIS — L570 Actinic keratosis: Secondary | ICD-10-CM | POA: Diagnosis not present

## 2023-07-24 DIAGNOSIS — D492 Neoplasm of unspecified behavior of bone, soft tissue, and skin: Secondary | ICD-10-CM

## 2023-07-24 DIAGNOSIS — D485 Neoplasm of uncertain behavior of skin: Secondary | ICD-10-CM

## 2023-07-24 DIAGNOSIS — L578 Other skin changes due to chronic exposure to nonionizing radiation: Secondary | ICD-10-CM | POA: Diagnosis not present

## 2023-07-24 NOTE — Patient Instructions (Addendum)
Cryotherapy Aftercare  Wash gently with soap and water everyday.   Apply Vaseline and Band-Aid daily until healed.    Patient Handout: Wound Care for Skin Biopsy Site  Taking Care of Your Skin Biopsy Site  Proper care of the biopsy site is essential for promoting healing and minimizing scarring. This handout provides instructions on how to care for your biopsy site to ensure optimal recovery.  1. Cleaning the Wound:  Clean the biopsy site daily with gentle soap and water. Gently pat the area dry with a clean, soft towel. Avoid harsh scrubbing or rubbing the area, as this can irritate the skin and delay healing.  2. Applying Aquaphor and Bandage:  After cleaning the wound, apply a thin layer of Aquaphor ointment to the biopsy site. Cover the area with a sterile bandage to protect it from dirt, bacteria, and friction. Change the bandage daily or as needed if it becomes soiled or wet.  3. Continued Care for One Week:  Repeat the cleaning, Aquaphor application, and bandaging process daily for one week following the biopsy procedure. Keeping the wound clean and moist during this initial healing period will help prevent infection and promote optimal healing.  4. Massaging Aquaphor into the Area:  ---After one week, discontinue the use of bandages but continue to apply Aquaphor to the biopsy site. ----Gently massage the Aquaphor into the area using circular motions. ---Massaging the skin helps to promote circulation and prevent the formation of scar tissue.   Additional Tips:  Avoid exposing the biopsy site to direct sunlight during the healing process, as this can cause hyperpigmentation or worsen scarring. If you experience any signs of infection, such as increased redness, swelling, warmth, or drainage from the wound, contact your healthcare provider immediately. Follow any additional instructions provided by your healthcare provider for caring for the biopsy site and managing any  discomfort. Conclusion:  Taking proper care of your skin biopsy site is crucial for ensuring optimal healing and minimizing scarring. By following these instructions for cleaning, applying Aquaphor, and massaging the area, you can promote a smooth and successful recovery. If you have any questions or concerns about caring for your biopsy site, don't hesitate to contact your healthcare provider for guidance.    Important Information  Due to recent changes in healthcare laws, you may see results of your pathology and/or laboratory studies on MyChart before the doctors have had a chance to review them. We understand that in some cases there may be results that are confusing or concerning to you. Please understand that not all results are received at the same time and often the doctors may need to interpret multiple results in order to provide you with the best plan of care or course of treatment. Therefore, we ask that you please give [place] 2 business days to thoroughly review all your results before contacting the office for clarification. Should we see a critical lab result, you will be contacted sooner.   If You Need Anything After Your Visit  If you have any questions or concerns for your doctor, please call our main line at [phone] If no one answers, please leave a voicemail as directed and we will return your call as soon as possible. Messages left after 4 pm will be answered the following business day.   You may also send [place] a message via MyChart. We typically respond to MyChart messages within 1-2 business days.  For prescription refills, please ask your pharmacy to contact our office. Our fax  number is [phone].  If you have an urgent issue when the clinic is closed that cannot wait until the next business day, you can page your doctor at the number below.    Please note that while we do our best to be available for urgent issues outside of office hours, we are not available 24/7.    If you have an urgent issue and are unable to reach [place], you may choose to seek medical care at your doctor's office, retail clinic, urgent care center, or emergency room.  If you have a medical emergency, please immediately call 911 or go to the emergency department. In the event of inclement weather, please call our main line at [phone] for an update on the status of any delays or closures.  Dermatology Medication Tips: Please keep the boxes that topical medications come in in order to help keep track of the instructions about where and how to use these. Pharmacies typically print the medication instructions only on the boxes and not directly on the medication tubes.   If your medication is too expensive, please contact our office at [phone] or send [place] a message through MyChart.   We are unable to tell what your co-pay for medications will be in advance as this is different depending on your insurance coverage. However, we may be able to find a substitute medication at lower cost or fill out paperwork to get insurance to cover a needed medication.   If a prior authorization is required to get your medication covered by your insurance company, please allow [place] 1-2 business days to complete this process.  Drug prices often vary depending on where the prescription is filled and some pharmacies may offer cheaper prices.  The website www.goodrx.com contains coupons for medications through different pharmacies. The prices here do not account for what the cost may be with help from insurance (it may be cheaper with your insurance), but the website can give you the price if you did not use any insurance.  - You can print the associated coupon and take it with your prescription to the pharmacy.  - You may also stop by our office during regular business hours and pick up a GoodRx coupon card.  - If you need your prescription sent electronically to a different pharmacy, notify our office  through [facility] or by phone at [phone]

## 2023-07-24 NOTE — Progress Notes (Signed)
New Patient Visit   Subjective  Phyllis Washington is a 43 y.o. female who presents for the following: New PT - Skin Lesion  Patient states she has skin lesion located at the face that she would like to have examined. Patient reports the areas have been there for 6 months. She reports the areas are bothersome.Patient rates irritation (itchy)4 out of 10. She states that the areas have not spread. Patient reports she has not previously been treated for these areas but PCP has sent referral to specialist. Patient denies Hx of bx. Patient reports family history of skin cancer(s).(Paternal grandfather - unsure of type)   The following portions of the chart were reviewed this encounter and updated as appropriate: medications, allergies, medical history  Review of Systems:  No other skin or systemic complaints except as noted in HPI or Assessment and Plan.  Objective  Well appearing patient in no apparent distress; mood and affect are within normal limits.   A focused examination was performed of the following areas: face   Relevant exam findings are noted in the Assessment and Plan.         Left Nasal Sidewall 5mm pink crusted papule Left Forearm - Anterior Erythematous thin papules/macules with gritty scale.   Assessment & Plan   Pink-Crusted Papule on Left Lateral Nasal Sidewall Assessment: A 5mm pink-crusted papule on the left lateral nasal sidewall, present for about six months and itchy. Given the characteristics and duration, there is a 50% chance it could be skin cancer.  Plan: Perform shave biopsy of the lesion. Send biopsy specimen to lab for pathological examination. Instruct patient to apply Aquaphor (petrolatum) and Band-Aid to biopsy site at home. If benign, inform patient via MyChart. If malignant, call patient and refer to Dr. Caralyn Guile (surgeon) for further treatment.  Actinic Keratosis on Left Forearm Assessment: A pigmented keratotic papule on the left forearm, diagnosed  as actinic keratosis, described as a pre-cancerous lesion.  Plan: Treat with cryotherapy using liquid nitrogen. Educate patient on expected post-treatment course (crusting and falling off).  Actinic Damage Assessment: Presence of lentigines and actinic damage, indicating sun-induced skin changes.  Plan: Educate patient on the importance of sunscreen use for prevention of future skin issues.  NEOPLASM OF UNCERTAIN BEHAVIOR OF SKIN Left Nasal Sidewall Skin / nail biopsy Type of biopsy: tangential   Informed consent: discussed and consent obtained   Timeout: patient name, date of birth, surgical site, and procedure verified   Procedure prep:  Patient was prepped and draped in usual sterile fashion Prep type:  Isopropyl alcohol Anesthesia: the lesion was anesthetized in a standard fashion   Anesthetic:  1% lidocaine w/ epinephrine 1-100,000 buffered w/ 8.4% NaHCO3 Instrument used: DermaBlade   Hemostasis achieved with: aluminum chloride   Outcome: patient tolerated procedure well   Post-procedure details: sterile dressing applied and wound care instructions given   Dressing type: petrolatum gauze and bandage   AK (ACTINIC KERATOSIS) Left Forearm - Anterior Destruction of lesion - Left Forearm - Anterior Complexity: simple   Destruction method: cryotherapy   Informed consent: discussed and consent obtained   Timeout:  patient name, date of birth, surgical site, and procedure verified Lesion destroyed using liquid nitrogen: Yes   Region frozen until ice ball extended beyond lesion: Yes   Outcome: patient tolerated procedure well with no complications   Post-procedure details: wound care instructions given    No follow-ups on file.    Documentation: I have reviewed the above documentation for accuracy  and completeness, and I agree with the above.   I, Shirron Marcha Solders, CMA, am acting as scribe for [employer], DO.   Langston Reusing, DO

## 2023-07-25 LAB — SURGICAL PATHOLOGY

## 2023-07-26 ENCOUNTER — Encounter: Payer: Self-pay | Admitting: Dermatology

## 2023-08-13 ENCOUNTER — Other Ambulatory Visit: Payer: Self-pay | Admitting: Family Medicine

## 2023-08-26 NOTE — Assessment & Plan Note (Signed)
 Follows with [facility], no change to meds.

## 2023-08-26 NOTE — Assessment & Plan Note (Signed)
 Encourage heart healthy diet such as MIND or DASH diet, increase exercise, avoid trans fats, simple carbohydrates and processed foods, consider a krill or fish or flaxseed oil cap daily.

## 2023-08-26 NOTE — Assessment & Plan Note (Signed)
 hgba1c acceptable, minimize simple carbs. Increase exercise as tolerated.

## 2023-08-28 ENCOUNTER — Telehealth (INDEPENDENT_AMBULATORY_CARE_PROVIDER_SITE_OTHER): Payer: Medicare Other | Admitting: Family Medicine

## 2023-08-28 DIAGNOSIS — F3110 Bipolar disorder, current episode manic without psychotic features, unspecified: Secondary | ICD-10-CM

## 2023-08-28 DIAGNOSIS — R739 Hyperglycemia, unspecified: Secondary | ICD-10-CM

## 2023-08-28 DIAGNOSIS — E782 Mixed hyperlipidemia: Secondary | ICD-10-CM

## 2023-08-28 DIAGNOSIS — R5383 Other fatigue: Secondary | ICD-10-CM

## 2023-08-28 DIAGNOSIS — R0683 Snoring: Secondary | ICD-10-CM

## 2023-08-28 DIAGNOSIS — E6609 Other obesity due to excess calories: Secondary | ICD-10-CM

## 2023-08-28 DIAGNOSIS — D72829 Elevated white blood cell count, unspecified: Secondary | ICD-10-CM

## 2023-08-28 NOTE — Patient Instructions (Signed)
 Varilux lighting for seasonal affective disorder see if your insurance will cover and we can write a prescription for it if needed.

## 2023-08-29 ENCOUNTER — Other Ambulatory Visit (INDEPENDENT_AMBULATORY_CARE_PROVIDER_SITE_OTHER)

## 2023-08-29 ENCOUNTER — Encounter: Payer: Self-pay | Admitting: Family Medicine

## 2023-08-29 DIAGNOSIS — R739 Hyperglycemia, unspecified: Secondary | ICD-10-CM

## 2023-08-29 DIAGNOSIS — D72829 Elevated white blood cell count, unspecified: Secondary | ICD-10-CM | POA: Diagnosis not present

## 2023-08-29 DIAGNOSIS — E782 Mixed hyperlipidemia: Secondary | ICD-10-CM | POA: Diagnosis not present

## 2023-08-29 DIAGNOSIS — R5383 Other fatigue: Secondary | ICD-10-CM

## 2023-08-29 LAB — LIPID PANEL
Cholesterol: 194 mg/dL (ref 0–200)
HDL: 51.6 mg/dL (ref >39.00)
LDL Cholesterol: 126 mg/dL — ABNORMAL HIGH (ref 0–99)
NonHDL: 142.37
Total CHOL/HDL Ratio: 4
Triglycerides: 81 mg/dL (ref 0.0–149.0)
VLDL: 16.2 mg/dL (ref 0.0–40.0)

## 2023-08-29 LAB — COMPREHENSIVE METABOLIC PANEL
ALT: 11 U/L (ref 0–35)
AST: 14 U/L (ref 0–37)
Albumin: 4.2 g/dL (ref 3.5–5.2)
Alkaline Phosphatase: 76 U/L (ref 39–117)
BUN: 10 mg/dL (ref 6–23)
CO2: 24 meq/L (ref 19–32)
Calcium: 9.1 mg/dL (ref 8.4–10.5)
Chloride: 104 meq/L (ref 96–112)
Creatinine, Ser: 0.76 mg/dL (ref 0.40–1.20)
GFR: 94.96 mL/min (ref >60.00)
Glucose, Bld: 109 mg/dL — ABNORMAL HIGH (ref 70–99)
Potassium: 4.1 meq/L (ref 3.5–5.1)
Sodium: 137 meq/L (ref 135–145)
Total Bilirubin: 0.4 mg/dL (ref 0.2–1.2)
Total Protein: 6.5 g/dL (ref 6.0–8.3)

## 2023-08-29 LAB — CBC WITH DIFFERENTIAL/PLATELET
Basophils Absolute: 0 10*3/uL (ref 0.0–0.1)
Basophils Relative: 0.3 % (ref 0.0–3.0)
Eosinophils Absolute: 0.2 10*3/uL (ref 0.0–0.7)
Eosinophils Relative: 2 % (ref 0.0–5.0)
HCT: 38.6 % (ref 36.0–46.0)
Hemoglobin: 12.7 g/dL (ref 12.0–15.0)
Lymphocytes Relative: 19.2 % (ref 12.0–46.0)
Lymphs Abs: 2.2 10*3/uL (ref 0.7–4.0)
MCHC: 33 g/dL (ref 30.0–36.0)
MCV: 90.7 fl (ref 78.0–100.0)
Monocytes Absolute: 0.6 10*3/uL (ref 0.1–1.0)
Monocytes Relative: 5 % (ref 3.0–12.0)
Neutro Abs: 8.3 10*3/uL — ABNORMAL HIGH (ref 1.4–7.7)
Neutrophils Relative %: 73.5 % (ref 43.0–77.0)
Platelets: 314 10*3/uL (ref 150.0–400.0)
RBC: 4.26 Mil/uL (ref 3.87–5.11)
RDW: 13.1 % (ref 11.5–15.5)
WBC: 11.3 10*3/uL — ABNORMAL HIGH (ref 4.0–10.5)

## 2023-08-29 LAB — HEMOGLOBIN A1C: Hgb A1c MFr Bld: 5.5 % (ref 4.6–6.5)

## 2023-08-29 LAB — TSH: TSH: 6.99 u[IU]/mL — ABNORMAL HIGH (ref 0.35–5.50)
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
## 2023-08-30 ENCOUNTER — Ambulatory Visit

## 2023-08-30 ENCOUNTER — Other Ambulatory Visit: Payer: Self-pay | Admitting: *Deleted

## 2023-08-30 ENCOUNTER — Encounter: Payer: Self-pay | Admitting: Family Medicine

## 2023-08-30 DIAGNOSIS — E782 Mixed hyperlipidemia: Secondary | ICD-10-CM

## 2023-08-30 DIAGNOSIS — R5383 Other fatigue: Secondary | ICD-10-CM

## 2023-08-30 LAB — T4, FREE: Free T4: 0.62 ng/dL (ref 0.60–1.60)

## 2023-08-30 NOTE — Telephone Encounter (Signed)
 Pt called to check status of message. Informed E2C2 that we are waiting for PCP response.

## 2023-09-10 ENCOUNTER — Encounter (INDEPENDENT_AMBULATORY_CARE_PROVIDER_SITE_OTHER): Payer: Self-pay

## 2023-10-18 ENCOUNTER — Ambulatory Visit

## 2023-10-18 VITALS — Ht 67.0 in | Wt 217.0 lb

## 2023-10-18 DIAGNOSIS — Z0000 Encounter for general adult medical examination without abnormal findings: Secondary | ICD-10-CM | POA: Diagnosis not present

## 2023-10-18 DIAGNOSIS — Z1211 Encounter for screening for malignant neoplasm of colon: Secondary | ICD-10-CM
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
## 2023-10-18 NOTE — Patient Instructions (Addendum)
 Phyllis Washington , Thank you for taking time to come for your Medicare Wellness Visit. I appreciate your ongoing commitment to your health goals. Please review the following plan we discussed and let me know if I can assist you in the future.   Referrals/Orders/Follow-Ups/Clinician Recommendations:   This is a list of the screening recommended for you and due dates:  Health Maintenance  Topic Date Due   Pneumococcal Vaccination (1 of 2 - PCV) Never done   Colon Cancer Screening  Never done   Medicare Annual Wellness Visit  10/17/2024   Pap with HPV screening  09/15/2026   DTaP/Tdap/Td vaccine (3 - Td or Tdap) 05/28/2033   Hepatitis C Screening  Completed   HIV Screening  Completed   HPV Vaccine  Aged Out   Meningitis B Vaccine  Aged Out   Flu Shot  Discontinued   COVID-19 Vaccine  Discontinued    Advanced directives: (Declined) Advance directive discussed with you today. Even though you declined this today, please call our office should you change your mind, and we can give you the proper paperwork for you to fill out.  Next Medicare Annual Wellness Visit scheduled for next year: Yes

## 2023-10-22 ENCOUNTER — Other Ambulatory Visit: Payer: Self-pay | Admitting: Family Medicine

## 2023-10-25 ENCOUNTER — Ambulatory Visit (INDEPENDENT_AMBULATORY_CARE_PROVIDER_SITE_OTHER): Admitting: Family Medicine

## 2023-10-25 ENCOUNTER — Encounter (INDEPENDENT_AMBULATORY_CARE_PROVIDER_SITE_OTHER): Payer: Self-pay | Admitting: Family Medicine

## 2023-10-25 VITALS — BP 122/82 | HR 78 | Temp 98.2°F | Ht 65.5 in | Wt 217.0 lb

## 2023-10-25 DIAGNOSIS — Z6835 Body mass index (BMI) 35.0-35.9, adult: Secondary | ICD-10-CM

## 2023-10-25 DIAGNOSIS — E782 Mixed hyperlipidemia: Secondary | ICD-10-CM | POA: Diagnosis not present

## 2023-10-25 DIAGNOSIS — E6609 Other obesity due to excess calories: Secondary | ICD-10-CM

## 2023-10-25 DIAGNOSIS — F3110 Bipolar disorder, current episode manic without psychotic features, unspecified: Secondary | ICD-10-CM

## 2023-10-25 DIAGNOSIS — Z0289 Encounter for other administrative examinations: Secondary | ICD-10-CM
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
## 2023-10-28 NOTE — Assessment & Plan Note (Signed)
 hgba1c acceptable, minimize simple carbs. Increase exercise as tolerated.

## 2023-10-28 NOTE — Assessment & Plan Note (Signed)
 Encourage heart healthy diet such as MIND or DASH diet, increase exercise, avoid trans fats, simple carbohydrates and processed foods, consider a krill or fish or flaxseed oil cap daily.

## 2023-10-28 NOTE — Assessment & Plan Note (Signed)
 Continues to work with [facility].

## 2023-10-30 ENCOUNTER — Encounter: Payer: Self-pay | Admitting: Gastroenterology

## 2023-10-30 ENCOUNTER — Telehealth (INDEPENDENT_AMBULATORY_CARE_PROVIDER_SITE_OTHER): Admitting: Family Medicine

## 2023-10-30 ENCOUNTER — Encounter: Payer: Self-pay | Admitting: Family Medicine

## 2023-10-30 VITALS — BP 114/76 | HR 76 | Ht 65.5 in | Wt 216.0 lb

## 2023-10-30 DIAGNOSIS — D72829 Elevated white blood cell count, unspecified: Secondary | ICD-10-CM | POA: Diagnosis not present

## 2023-10-30 DIAGNOSIS — F418 Other specified anxiety disorders: Secondary | ICD-10-CM

## 2023-10-30 DIAGNOSIS — R739 Hyperglycemia, unspecified: Secondary | ICD-10-CM | POA: Diagnosis not present

## 2023-10-30 DIAGNOSIS — R7989 Other specified abnormal findings of blood chemistry: Secondary | ICD-10-CM

## 2023-10-30 DIAGNOSIS — E6609 Other obesity due to excess calories: Secondary | ICD-10-CM

## 2023-10-30 DIAGNOSIS — E782 Mixed hyperlipidemia: Secondary | ICD-10-CM

## 2023-10-30 DIAGNOSIS — F3110 Bipolar disorder, current episode manic without psychotic features, unspecified: Secondary | ICD-10-CM | POA: Diagnosis not present

## 2023-10-30 DIAGNOSIS — R946 Abnormal results of thyroid function studies: Secondary | ICD-10-CM
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
## 2023-10-30 NOTE — Assessment & Plan Note (Signed)
 Maintain reduced calorie diet, decrease po intake and increase exercise as tolerated. Needs 7-8 hours of sleep nightly. Avoid trans fats, eat small, frequent meals every 4-5 hours with lean proteins, complex carbs and healthy fats. Minimize simple carbs, high fat foods and processed foods. She is working with Dr Carolene Chute at [facility]. She is walking more than 15000 steps on some days and going to the gym

## 2023-10-30 NOTE — Assessment & Plan Note (Signed)
 Repeat TSH and free T4 in June

## 2023-10-30 NOTE — Assessment & Plan Note (Signed)
 Stable on current meds and does not wish to make adjustments despite her struggles with her weight since she is doing so much better mentally

## 2023-10-30 NOTE — Assessment & Plan Note (Signed)
 Hydrate and monitor

## 2023-11-08 ENCOUNTER — Encounter: Payer: Self-pay | Admitting: Primary Care

## 2023-11-08 ENCOUNTER — Other Ambulatory Visit (INDEPENDENT_AMBULATORY_CARE_PROVIDER_SITE_OTHER)

## 2023-11-08 ENCOUNTER — Ambulatory Visit (INDEPENDENT_AMBULATORY_CARE_PROVIDER_SITE_OTHER): Admitting: Primary Care

## 2023-11-08 VITALS — BP 108/64 | HR 89 | Temp 98.1°F | Ht 65.0 in | Wt 215.2 lb

## 2023-11-08 DIAGNOSIS — R5383 Other fatigue: Secondary | ICD-10-CM | POA: Diagnosis not present

## 2023-11-08 DIAGNOSIS — R739 Hyperglycemia, unspecified: Secondary | ICD-10-CM | POA: Diagnosis not present

## 2023-11-08 DIAGNOSIS — F3110 Bipolar disorder, current episode manic without psychotic features, unspecified: Secondary | ICD-10-CM | POA: Diagnosis not present

## 2023-11-08 DIAGNOSIS — D72829 Elevated white blood cell count, unspecified: Secondary | ICD-10-CM | POA: Diagnosis not present

## 2023-11-08 DIAGNOSIS — R0683 Snoring: Secondary | ICD-10-CM

## 2023-11-08 DIAGNOSIS — F17210 Nicotine dependence, cigarettes, uncomplicated: Secondary | ICD-10-CM

## 2023-11-08 DIAGNOSIS — R7989 Other specified abnormal findings of blood chemistry: Secondary | ICD-10-CM

## 2023-11-08 DIAGNOSIS — E782 Mixed hyperlipidemia: Secondary | ICD-10-CM | POA: Diagnosis not present

## 2023-11-08 LAB — LIPID PANEL
Cholesterol: 208 mg/dL — ABNORMAL HIGH (ref 0–200)
HDL: 41.4 mg/dL (ref >39.00)
LDL Cholesterol: 142 mg/dL — ABNORMAL HIGH (ref 0–99)
NonHDL: 166.75
Total CHOL/HDL Ratio: 5
Triglycerides: 124 mg/dL (ref 0.0–149.0)
VLDL: 24.8 mg/dL (ref 0.0–40.0)

## 2023-11-08 LAB — COMPREHENSIVE METABOLIC PANEL WITH GFR
ALT: 12 U/L (ref 0–35)
AST: 18 U/L (ref 0–37)
Albumin: 4.6 g/dL (ref 3.5–5.2)
Alkaline Phosphatase: 70 U/L (ref 39–117)
BUN: 14 mg/dL (ref 6–23)
CO2: 24 meq/L (ref 19–32)
Calcium: 9.4 mg/dL (ref 8.4–10.5)
Chloride: 106 meq/L (ref 96–112)
Creatinine, Ser: 0.85 mg/dL (ref 0.40–1.20)
GFR: 82.91 mL/min (ref >60.00)
Glucose, Bld: 103 mg/dL — ABNORMAL HIGH (ref 70–99)
Potassium: 4.5 meq/L (ref 3.5–5.1)
Sodium: 138 meq/L (ref 135–145)
Total Bilirubin: 0.5 mg/dL (ref 0.2–1.2)
Total Protein: 6.7 g/dL (ref 6.0–8.3)

## 2023-11-08 LAB — TSH: TSH: 4 u[IU]/mL (ref 0.35–5.50)

## 2023-11-08 LAB — CBC WITH DIFFERENTIAL/PLATELET
Basophils Absolute: 0.1 10*3/uL (ref 0.0–0.1)
Basophils Relative: 0.6 % (ref 0.0–3.0)
Eosinophils Absolute: 0.2 10*3/uL (ref 0.0–0.7)
Eosinophils Relative: 2 % (ref 0.0–5.0)
HCT: 38.9 % (ref 36.0–46.0)
Hemoglobin: 12.7 g/dL (ref 12.0–15.0)
Lymphocytes Relative: 23 % (ref 12.0–46.0)
Lymphs Abs: 2.1 10*3/uL (ref 0.7–4.0)
MCHC: 32.6 g/dL (ref 30.0–36.0)
MCV: 89.4 fl (ref 78.0–100.0)
Monocytes Absolute: 0.4 10*3/uL (ref 0.1–1.0)
Monocytes Relative: 4 % (ref 3.0–12.0)
Neutro Abs: 6.4 10*3/uL (ref 1.4–7.7)
Neutrophils Relative %: 70.4 % (ref 43.0–77.0)
Platelets: 315 10*3/uL (ref 150.0–400.0)
RBC: 4.35 Mil/uL (ref 3.87–5.11)
RDW: 13.7 % (ref 11.5–15.5)
WBC: 9.1 10*3/uL (ref 4.0–10.5)

## 2023-11-08 LAB — HEMOGLOBIN A1C: Hgb A1c MFr Bld: 5.4 % (ref 4.6–6.5)

## 2023-11-08 LAB — T4, FREE: Free T4: 0.58 ng/dL — ABNORMAL LOW (ref 0.60–1.60)

## 2023-11-08 NOTE — Patient Instructions (Signed)
  VISIT SUMMARY: Today, you came in for a sleep consultation. You have been feeling more energetic and have lost 16 pounds since starting your gym routine nine weeks ago. You typically go to bed at 10 PM, fall asleep within 30 minutes with the help of Seroquel, and wake up around 7 to 7:30 AM. Your energy levels have improved, and you do not experience significant daytime sleepiness. Your Epworth Sleepiness Scale score is 4 out of 24. Your husband has noticed that you snore, but you do not wake up gasping or choking. You sleep on your side and do not consume alcohol. There is no family history of sleep apnea.  YOUR PLAN: -SNORING: Snoring is the sound produced by the vibration of the respiratory structures due to obstructed air movement during sleep. You do not have symptoms that strongly suggest sleep apnea, such as waking up gasping or choking, significant daytime sleepiness, or witnessed apnea. It is recommended that you avoid alcohol before bed and continue sleeping on your side to reduce the risk of airway obstruction. A sleep study is not recommended at this time due to the low suspicion of sleep apnea and cost considerations.  -FATIGUE: Fatigue is a feeling of tiredness or exhaustion. Your fatigue has improved since you started your exercise regimen and lost 16 pounds. It is important to continue your exercise routine and monitor your energy levels. If you notice any changes, please report them.  INSTRUCTIONS: Please continue your current exercise regimen and monitor your energy levels. Avoid alcohol before bed and continue sleeping on your side. If you notice any changes in your symptoms or energy levels, please report them. A sleep study is not recommended at this time, but it could be considered if your symptoms change.
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
## 2023-11-10 ENCOUNTER — Encounter: Payer: Self-pay | Admitting: Family Medicine

## 2023-11-11 ENCOUNTER — Ambulatory Visit: Payer: Self-pay | Admitting: Family Medicine

## 2023-11-20 ENCOUNTER — Other Ambulatory Visit: Payer: Self-pay | Admitting: Family Medicine

## 2023-11-20 DIAGNOSIS — Z1231 Encounter for screening mammogram for malignant neoplasm of breast: Secondary | ICD-10-CM

## 2023-11-26 ENCOUNTER — Ambulatory Visit (HOSPITAL_BASED_OUTPATIENT_CLINIC_OR_DEPARTMENT_OTHER)
Admission: RE | Admit: 2023-11-26 | Discharge: 2023-11-26 | Disposition: A | Discharge status: Home / Self Care | Source: Ambulatory Visit | Attending: Family Medicine | Admitting: Family Medicine

## 2023-11-26 ENCOUNTER — Ambulatory Visit (INDEPENDENT_AMBULATORY_CARE_PROVIDER_SITE_OTHER): Admitting: Family Medicine

## 2023-11-26 ENCOUNTER — Ambulatory Visit: Payer: Self-pay | Admitting: *Deleted

## 2023-11-26 VITALS — BP 123/80 | HR 77 | Temp 97.9°F | Resp 16 | Ht 65.0 in | Wt 214.0 lb

## 2023-11-26 DIAGNOSIS — M25562 Pain in left knee: Secondary | ICD-10-CM | POA: Insufficient documentation
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
## 2023-11-26 NOTE — Telephone Encounter (Signed)
 Copied from CRM [id number]. Topic: Clinical - Red Word Triage >> Nov 26, 2023 11:54 AM [place] A wrote: [facility] that prompted transfer to Nurse Triage: Patient said on Friday she did something to her knee, it popped and there was burning sensation Reason for Disposition  [1] Swollen joint AND [2] no fever or redness  Answer Assessment - Initial Assessment Questions 1. LOCATION and RADIATION: "Where is the pain located?"      On Friday my knee popped and a hot sensation came over my knee and then it hurt for the next 2 days.   This morning it felt fine.   It popped again and I almost fell on the ground.   Now it's hurting.  It's on the medial side of my left knee.  I want to work out at the gym but don't want to until I can get it seen. 2. QUALITY: "What does the pain feel like?"  (e.g., sharp, dull, aching, burning)     See above 3. SEVERITY: "How bad is the pain?" "What does it keep you from doing?"   (Scale 1-10; or mild, moderate, severe)   -  MILD (1-3): doesn't interfere with normal activities    -  MODERATE (4-7): interferes with normal activities (e.g., work or school) or awakens from sleep, limping    -  SEVERE (8-10): excruciating pain, unable to do any normal activities, unable to walk     Moderate 4. ONSET: "When did the pain start?" "Does it come and go, or is it there all the time?"     Friday 5. RECURRENT: "Have you had this pain before?" If Yes, ask: "When, and what happened then?"     No 6. SETTING: "Has there been any recent work, exercise or other activity that involved that part of the body?"      I was getting on a piece of exercise equipment at the gym and it popped. 7. AGGRAVATING FACTORS: "What makes the knee pain worse?" (e.g., walking, climbing stairs, running)     Moving  8. ASSOCIATED SYMPTOMS: "Is there any swelling or redness of the knee?"     It did swell Friday some.   Sat. It went down. 9. OTHER SYMPTOMS: "Do you have any other symptoms?" (e.g., chest pain,  difficulty breathing, fever, calf pain)     Aches and throbs. 10. PREGNANCY: "Is there any chance you are pregnant?" "When was your last menstrual period?"       Not asked  Protocols used: Knee Pain-A-AH  Chief Complaint: Left knee popped on Friday and not it's hurting  Symptoms: above Frequency: Happened Friday while getting onto a piece of exercise equipment at the gym Pertinent Negatives: Patient denies it being swollen at this point. Disposition: [] ED /[] Urgent Care (no appt availability in office) / [x] Appointment(In office/virtual)/ []  [place] [facility]/ [] Home Care/ [] Refused Recommended Disposition /[] [facility]/ []  Follow-up with PCP Additional Notes: Appt made for today with Dr. Dino Frank

## 2023-11-28 ENCOUNTER — Telehealth: Payer: Self-pay

## 2023-11-28 NOTE — Telephone Encounter (Signed)
 Copied from CRM [id number]. Topic: Clinical - Lab/Test Results >> Nov 28, 2023 11:55 AM Phyllis Washington wrote: Reason for CRM: Patient calling in requesting the x ray results.

## 2023-11-28 NOTE — Telephone Encounter (Signed)
 Waiting for results.

## 2023-11-29 ENCOUNTER — Ambulatory Visit (AMBULATORY_SURGERY_CENTER)

## 2023-11-29 VITALS — Ht 65.0 in | Wt 209.0 lb

## 2023-11-29 DIAGNOSIS — Z1211 Encounter for screening for malignant neoplasm of colon: Secondary | ICD-10-CM

## 2023-11-29 MED ORDER — PEG 3350-KCL-NA BICARB-NACL 420 G PO SOLR: 4000.0000 mL | Freq: Once | ORAL | 0 refills | Status: AC

## 2023-11-29 NOTE — Progress Notes (Signed)
 No egg or soy allergy known to patient  No issues known to pt with past sedation with any surgeries or procedures Patient denies ever being told they had issues or difficulty with intubation  No FH of Malignant Hyperthermia Pt is not on diet pills Pt is not on  home 02  Pt is not on blood thinners  Pt has issues with constipation and takes miralax everyday and probiotic gummies No A fib or A flutter Have any cardiac testing pending--no Pt can ambulate independently Pt denies use of chewing tobacco Discussed diabetic I weight loss medication holds Discussed NSAID holds Checked BMI Pt instructed to use Singlecare.com or GoodRx for a price reduction on prep  Patient's chart reviewed by Rogena Class CNRA prior to previsit and patient appropriate for the [facility].  Pre visit completed and red dot placed by patient's name on their procedure day (on provider's schedule).

## 2023-12-03 ENCOUNTER — Ambulatory Visit: Payer: Self-pay | Admitting: Family Medicine

## 2023-12-04 ENCOUNTER — Telehealth: Payer: Self-pay | Admitting: Gastroenterology

## 2023-12-04 ENCOUNTER — Ambulatory Visit
Admission: RE | Admit: 2023-12-04 | Discharge: 2023-12-04 | Disposition: A | Discharge status: Home / Self Care | Source: Ambulatory Visit | Attending: Family Medicine | Admitting: Family Medicine

## 2023-12-04 DIAGNOSIS — Z1231 Encounter for screening mammogram for malignant neoplasm of breast: Secondary | ICD-10-CM

## 2023-12-04 NOTE — Telephone Encounter (Signed)
 Inbound call from patient, would like prep instructions resent. She states she is unsure if she would need to take both dulcolax and liquid, she would like to know if taking both is necessary.

## 2023-12-04 NOTE — Telephone Encounter (Signed)
 Returned patient call, reached voicemail.  Left message advising that Ducolax prior to drinking Golytely  was part of the bowel prep.

## 2023-12-05 ENCOUNTER — Ambulatory Visit (INDEPENDENT_AMBULATORY_CARE_PROVIDER_SITE_OTHER): Admitting: Orthopaedic Surgery

## 2023-12-05 ENCOUNTER — Ambulatory Visit
Admission: RE | Admit: 2023-12-05 | Discharge: 2023-12-05 | Disposition: A | Discharge status: Home / Self Care | Source: Ambulatory Visit | Attending: Orthopaedic Surgery | Admitting: Orthopaedic Surgery

## 2023-12-05 DIAGNOSIS — M25562 Pain in left knee: Secondary | ICD-10-CM

## 2023-12-05 DIAGNOSIS — G8929 Other chronic pain: Secondary | ICD-10-CM
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
## 2023-12-06 ENCOUNTER — Other Ambulatory Visit

## 2023-12-18 ENCOUNTER — Ambulatory Visit: Admitting: Gastroenterology

## 2023-12-18 ENCOUNTER — Encounter: Payer: Self-pay | Admitting: Gastroenterology

## 2023-12-18 VITALS — BP 120/76 | HR 57 | Temp 98.1°F | Resp 14 | Ht 65.0 in | Wt 209.0 lb

## 2023-12-18 DIAGNOSIS — K635 Polyp of colon: Secondary | ICD-10-CM

## 2023-12-18 DIAGNOSIS — D125 Benign neoplasm of sigmoid colon: Secondary | ICD-10-CM

## 2023-12-18 DIAGNOSIS — Z1211 Encounter for screening for malignant neoplasm of colon: Secondary | ICD-10-CM

## 2023-12-18 DIAGNOSIS — K644 Residual hemorrhoidal skin tags: Secondary | ICD-10-CM

## 2023-12-18 DIAGNOSIS — K640 First degree hemorrhoids: Secondary | ICD-10-CM | POA: Diagnosis not present

## 2023-12-18 MED ORDER — SODIUM CHLORIDE 0.9 % IV SOLN: 500.0000 mL | Freq: Once | INTRAVENOUS | Status: DC

## 2023-12-18 NOTE — Progress Notes (Signed)
 Pt's states no medical or surgical changes since previsit or office visit.

## 2023-12-18 NOTE — Op Note (Signed)
 [facility] Patient Name: Phyllis Washington Procedure Date: 12/18/2023 9:34 AM MRN: [id number] Endoscopist: Aloha Finner , MD, [id number] Age: 43 Referring MD:  Date of Birth: 1980/08/06 Gender: Female Account #: [id number] Procedure:                Colonoscopy Indications:              Screening for colorectal malignant neoplasm Medicines:                Monitored Anesthesia Care Procedure:                Pre-Anesthesia Assessment:                           - Prior to the procedure, a History and Physical                            was performed, and patient medications and                            allergies were reviewed. The patient's tolerance of                            previous anesthesia was also reviewed. The risks                            and benefits of the procedure and the sedation                            options and risks were discussed with the patient.                            All questions were answered, and informed consent                            was obtained. Prior Anticoagulants: The patient has                            taken no anticoagulant or antiplatelet agents. ASA                            Grade Assessment: II - A patient with mild systemic                            disease. After reviewing the risks and benefits,                            the patient was deemed in satisfactory condition to                            undergo the procedure.                           After obtaining informed consent, the colonoscope  was passed under direct vision. Throughout the                            procedure, the patient's blood pressure, pulse, and                            oxygen saturations were monitored continuously. The                            Colonoscope was introduced through the anus and                            advanced to the 3 cm into the ileum. The                            colonoscopy was  performed without difficulty. The                            patient tolerated the procedure. The quality of the                            bowel preparation was good. The terminal ileum,                            ileocecal valve, appendiceal orifice, and rectum                            were photographed. Scope In: 9:42:34 AM Scope Out: 9:50:53 AM Scope Withdrawal Time: 0 hours 6 minutes 45 seconds  Total Procedure Duration: 0 hours 8 minutes 19 seconds  Findings:                 Skin tags were found on perianal exam.                           The digital rectal exam findings include                            hemorrhoids. Pertinent negatives include no                            palpable rectal lesions.                           The terminal ileum and ileocecal valve appeared                            normal.                           A 10 mm polyp was found in the sigmoid colon. The                            polyp was sessile. The polyp was removed with a  cold snare. Resection and retrieval were complete.                           Normal mucosa was found in the entire colon                            otherwise.                           Non-bleeding non-thrombosed external and internal                            hemorrhoids were found during retroflexion, during                            perianal exam and during digital exam. The                            hemorrhoids were Grade II (internal hemorrhoids                            that prolapse but reduce spontaneously). Complications:            No immediate complications. Estimated Blood Loss:     Estimated blood loss was minimal. Impression:               - Perianal skin tags found on perianal exam.                            Hemorrhoids found on digital rectal exam.                           - The examined portion of the ileum was normal.                           - One 10 mm polyp in the sigmoid  colon, removed                            with a cold snare. Resected and retrieved.                           - Normal mucosa in the entire examined colon                            otherwise.                           - Non-bleeding non-thrombosed external and internal                            hemorrhoids. Recommendation:           - The patient will be observed post-procedure,                            until all discharge criteria are met.                           -  Discharge patient to home.                           - Patient has a contact number available for                            emergencies. The signs and symptoms of potential                            delayed complications were discussed with the                            patient. Return to normal activities tomorrow.                            Written discharge instructions were provided to the                            patient.                           - High fiber diet.                           - Use FiberCon 1-2 tablets PO daily.                           - Continue present medications.                           - Await pathology results.                           - Repeat colonoscopy in 3/10/30/08 years for                            surveillance based on pathology results.                           - The findings and recommendations were discussed                            with the patient.                           - The findings and recommendations were discussed                            with the patient's family. Aloha Finner, MD 12/18/2023 9:54:43 AM
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
## 2023-12-18 NOTE — Progress Notes (Signed)
 Called to room to assist during endoscopic procedure.  Patient ID and intended procedure confirmed with present staff. Received instructions for my participation in the procedure from the performing physician.

## 2023-12-18 NOTE — Progress Notes (Signed)
 Report given to PACU, vss

## 2023-12-18 NOTE — Patient Instructions (Addendum)
   Use FiberCon 1-2 tablets by mouth daily   Follow high fiber diet ( handout given to you today)  Continue previous diet & medications  Handouts on polyps & hemorrhoids given to you today.   Await pathology results on polyp removed    YOU HAD AN ENDOSCOPIC PROCEDURE TODAY AT THE  ENDOSCOPY CENTER:   Refer to the procedure report that was given to you for any specific questions about what was found during the examination.  If the procedure report does not answer your questions, please call your gastroenterologist to clarify.  If you requested that your care partner not be given the details of your procedure findings, then the procedure report has been included in a sealed envelope for you to review at your convenience later.  YOU SHOULD EXPECT: Some feelings of bloating in the abdomen. Passage of more gas than usual.  Walking can help get rid of the air that was put into your GI tract during the procedure and reduce the bloating. If you had a lower endoscopy (such as a colonoscopy or flexible sigmoidoscopy) you may notice spotting of blood in your stool or on the toilet paper. If you underwent a bowel prep for your procedure, you may not have a normal bowel movement for a few days.  Please Note:  You might notice some irritation and congestion in your nose or some drainage.  This is from the oxygen used during your procedure.  There is no need for concern and it should clear up in a day or so.  SYMPTOMS TO REPORT IMMEDIATELY:  Following lower endoscopy (colonoscopy or flexible sigmoidoscopy):  Excessive amounts of blood in the stool  Significant tenderness or worsening of abdominal pains  Swelling of the abdomen that is new, acute  Fever of 100F or higher   For urgent or emergent issues, a gastroenterologist can be reached at any hour by calling [phone]. Do not use MyChart messaging for urgent concerns.    DIET:  We do recommend a small meal at first, but then you may  proceed to your regular diet.  Drink plenty of fluids but you should avoid alcoholic beverages for 24 hours.  ACTIVITY:  You should plan to take it easy for the rest of today and you should NOT DRIVE or use heavy machinery until tomorrow (because of the sedation medicines used during the test).    FOLLOW UP: Our staff will call the number listed on your records the next business day following your procedure.  We will call around 7:15- 8:00 am to check on you and address any questions or concerns that you may have regarding the information given to you following your procedure. If we do not reach you, we will leave a message.     If any biopsies were taken you will be contacted by phone or by letter within the next 1-3 weeks.  Please call us  at [phone] if you have not heard about the biopsies in 3 weeks.    SIGNATURES/CONFIDENTIALITY: You and/or your care partner have signed paperwork which will be entered into your electronic medical record.  These signatures attest to the fact that that the information above on your After Visit Summary has been reviewed and is understood.  Full responsibility of the confidentiality of this discharge information lies with you and/or your care-partner.

## 2023-12-19 ENCOUNTER — Telehealth: Payer: Self-pay

## 2023-12-19 ENCOUNTER — Ambulatory Visit (INDEPENDENT_AMBULATORY_CARE_PROVIDER_SITE_OTHER): Admitting: Orthopaedic Surgery

## 2023-12-19 DIAGNOSIS — G8929 Other chronic pain: Secondary | ICD-10-CM

## 2023-12-19 DIAGNOSIS — M25562 Pain in left knee: Secondary | ICD-10-CM

## 2023-12-19 NOTE — Telephone Encounter (Signed)
  Follow up Call-     12/18/2023    8:32 AM  Call back number  Post procedure Call Back phone  # [phone]  Permission to leave phone message Yes     Patient questions:  Do you have a fever, pain , or abdominal swelling? No. Pain Score  0 *  Have you tolerated food without any problems? Yes.    Have you been able to return to your normal activities? Yes.    Do you have any questions about your discharge instructions: Diet   No. Medications  No. Follow up visit  No.  Do you have questions or concerns about your Care? No.  Actions: * If pain score is 4 or above: No action needed, pain <4.
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
## 2023-12-20 ENCOUNTER — Encounter (INDEPENDENT_AMBULATORY_CARE_PROVIDER_SITE_OTHER): Payer: Self-pay

## 2023-12-20 LAB — SURGICAL PATHOLOGY

## 2023-12-21 ENCOUNTER — Ambulatory Visit: Payer: Self-pay | Admitting: Gastroenterology

## 2023-12-25 ENCOUNTER — Ambulatory Visit (INDEPENDENT_AMBULATORY_CARE_PROVIDER_SITE_OTHER): Admitting: Family Medicine

## 2023-12-25 ENCOUNTER — Encounter (INDEPENDENT_AMBULATORY_CARE_PROVIDER_SITE_OTHER): Payer: Self-pay | Admitting: Family Medicine

## 2023-12-25 VITALS — BP 128/84 | HR 69 | Temp 98.7°F | Ht 66.0 in | Wt 206.0 lb

## 2023-12-25 DIAGNOSIS — R739 Hyperglycemia, unspecified: Secondary | ICD-10-CM | POA: Diagnosis not present

## 2023-12-25 DIAGNOSIS — R5383 Other fatigue: Secondary | ICD-10-CM | POA: Diagnosis not present

## 2023-12-25 DIAGNOSIS — R0602 Shortness of breath: Secondary | ICD-10-CM | POA: Diagnosis not present

## 2023-12-25 DIAGNOSIS — Z1331 Encounter for screening for depression: Secondary | ICD-10-CM

## 2023-12-25 DIAGNOSIS — E782 Mixed hyperlipidemia: Secondary | ICD-10-CM

## 2023-12-25 DIAGNOSIS — Z720 Tobacco use: Secondary | ICD-10-CM

## 2023-12-25 DIAGNOSIS — F3110 Bipolar disorder, current episode manic without psychotic features, unspecified: Secondary | ICD-10-CM | POA: Diagnosis not present

## 2023-12-25 DIAGNOSIS — Z6833 Body mass index (BMI) 33.0-33.9, adult: Secondary | ICD-10-CM

## 2023-12-25 NOTE — Progress Notes (Deleted)
 Phyllis Washington, D.O.  [facility], [facility] Specializing in Clinical Bariatric Medicine Office located at: [address]  [place], [place]  [address]   Bariatric Medicine Visit  Dear Phyllis Harlene LABOR, MD   Thank you for referring Phyllis Washington to our clinic today for evaluation.  We performed a consultation to discuss her options for treatment and educate the patient on her disease state.  The following note includes my evaluation and treatment recommendations.   Please do not hesitate to reach out to me directly if you have any further concerns.    Assessment and Plan:  No orders of the defined types were placed in this encounter.   There are no discontinued medications.   No orders of the defined types were placed in this encounter.       FOR THE DISEASE OF OBESITY:  *** Assessment & Plan: Recommended Dietary Goals Phyllis Washington is currently in the action stage of change. As such, her goal is to start our weight management plan.  She has agreed to implement: Follow Category 2 Meal Plan   Behavioral Intervention We discussed the following Behavioral Modification Strategies today: {dowtlossstrategies:31654}  Additional resources provided today: Handout on CAT 2 meal plan  Evidence-based interventions for health behavior change were utilized today including the discussion of self monitoring techniques, problem-solving barriers and SMART goal setting techniques.    Pt will specifically work on: Implementation of meal plan!    Recommended Physical Activity Goals Phyllis Washington has been advised to work up to 150 minutes of moderate intensity aerobic activity a week and strengthening exercises 2-3 times per week for cardiovascular health, weight loss maintenance and preservation of muscle mass.   She has agreed to: Maintain current level of activity.    Pharmacotherapy We discussed various medication options to help Phyllis Washington with her weight loss efforts and we both agreed to:  {EMagreedrx:29170}   ASSOCIATED CONDITIONS ADDRESSED TODAY:  Fatigue Assessment & Plan: Phyllis Washington does feel that her weight is causing her energy to be lower than it should be. Fatigue may be related to obesity, depression or many other causes. she does not appear to have any red flag symptoms and this appears to most likely be related to her current lifestyle habits and dietary intake.  Labs will be ordered and reviewed with her at their next office visit in two weeks.  Epworth sleepiness scale is 9 and appears to be OR not be (delete one) within normal limits. Phyllis Washington denies daytime somnolence and denies waking up still tired, both of which she attributes to he taking her medications. Patient has a history of symptoms of occasional morning headaches once monthly. Phyllis Washington generally gets 8-9 hours of sleep per night, and states that she has generally restful sleep. Snoring is present. Apneic episodes are not present.    ECG: Performed and reviewed/ interpreted independently.  Normal sinus rhythm, rate 66 bpm; reassuring without any acute abnormalities, will continue to monitor for symptoms    Shortness of breath on exertion Assessment & Plan: Phyllis Washington does feel that she gets out of breath more easily than she used to when she exercises and seems to be worsening over time with weight gain.  This has gotten worse recently. Phyllis Washington denies shortness of breath at rest or orthopnea. Pt denies chest pain, dizziness, heart palpitations, or excessive diaphoresis or nausea with activity.  This is not new and is ongoing.  Phyllis Washington shortness of breath appears to be obesity related and exercise induced, as they do not  appear to have any red flag symptoms/ concerns today.  Also, this condition appears to be related to a state of poor cardiovascular conditioning   Obtain labs today and will be reviewed with her at their next office visit in two weeks.  Indirect Calorimeter completed today to help guide our  dietary regimen. It shows a VO2 of 271 and a REE of 1872.  Her calculated basal metabolic rate is 8365 thus her resting energy expenditure is better than expected.  Patient agreed to work on weight loss at this time.  As Phyllis Washington progresses through our weight loss program, we will gradually increase exercise as tolerated to treat her current condition.   If Phyllis Washington follows our recommendations and loses 5-10% of their weight without improvement of her shortness of breath or if at any time, symptoms become more concerning, they agree to urgently follow up with their PCP/ specialist for further consideration/ evaluation.   Phyllis Washington verbalizes agreement with this plan.    Depression Screen  Assessment & Plan: Her PHQ-9 score was 12.   Per New Patient Packet: - Tends to eat when sad, as a reward, when bored *** -         Hyperlipidemia, mixed Assessment & Plan: Lab Results  Component Value Date   CHOL 208 (H) 11/08/2023   HDL 41.40 11/08/2023   LDLCALC 142 (H) 11/08/2023   LDLDIRECT 138.0 10/02/2022   TRIG 124.0 11/08/2023   CHOLHDL 5 11/08/2023    She enjoys eating steaks.    Limit red meat to 2 times per week ***     ***  Bipolar affective disorder, current episode manic, current episode severity unspecified (HCC) Assessment & Plan: After consultation did discuss with her meds  Pt did and they decided her current med regimen was necessary for her bipolar disease   Bipolar depression  *Zoloft 100 mg daily, *lithium 600 mg daily, *Risperdal 1 mg nightly, Seroquel 100 mg daily  *Gabapentin 300 mg TID *** - given to her as mood stabilizer as well as nerve pain in her right foot.    Dx in her 4s but has sx way before  Sees her counselor once every 3 weeks and sees psychiatrist at [facility] every 3 months.      Will not refer pt to Dr. Sharron, given she has both a counselor and a psychiatrist.     ***  Tobacco abuse Assessment & Plan:  Smokes a pack  a day and has done so 30+ years.   Vitis AHA or going to 1-800-QUIT-NOW for resources.   ***      FOLLOW UP:   Follow up in 2 weeks. She was informed of the importance of frequent follow up visits to maximize her success with intensive lifestyle modifications for her multiple health conditions.  Phyllis Washington is aware that we will review all of her lab results at our next visit.  She is aware that if anything is critical/ life threatening with the results, we will be contacting her via MyChart prior to the office visit to discuss management.    Chief Complaint:   OBESITY Phyllis Washington (MR# [id number]) is a pleasant 43 y.o. female who presents for evaluation and treatment of obesity and related comorbidities. Current BMI is Body mass index is 33.25 kg/m. Phyllis Washington has been struggling with her weight for many years and has been unsuccessful in either losing weight, maintaining weight loss, or reaching her healthy weight goal.  Phyllis Washington  is currently in the action stage of change and ready to dedicate time achieving and maintaining a healthier weight. Phyllis Washington is interested in becoming our patient and working on intensive lifestyle modifications including (but not limited to) diet and exercise for weight loss.  Phyllis Washington is on disability for bipolar depression. She is married and lives with her husband.  Does weight lifting about 60 minutes 3 days/week (Mon/Wed/Fri); for about 13 weeks.   Desired 160 lbs   Gained wt after having first child in 1999  Has done Atkins, cal counting, intermittent fasting. Started intermittent fasting in March 2025, which worked best; she was able to lose 20 lbs when only eating 6 hours per day.  Took her 12 weeks to lose that weight and has maintained this weight.   Eating outside the home and fast food/take out 1-3x/week  Craves sweets  Snacks in late afternoons more than any other time  Snacks on ice cream   Skips  breakfast daily   Worst food habit is eating late at night.   Does not drink caloric beverages; drinks black coffee and unsweetened tea.     ***   Subjective:   This is the patient's first visit at [facility].  The patient's NEW PATIENT PACKET that they filled out prior to today's office visit was reviewed at length and information from that paperwork was included within the following office visit note.    Included in the packet: current and past health history, medications, allergies, ROS, gynecologic history (women only), surgical history, family history, social history, weight history, weight loss surgery history (for those that have had weight loss surgery), nutritional evaluation, mood and food questionnaire along with a depression screening (PHQ9) on all patients, an Epworth questionnaire, sleep habits questionnaire, patient life and health improvement goals questionnaire. These will all be scanned into the patient's chart under the media tab.   Review of Systems: Please refer to new patient packet scanned into media. Pertinent positives were addressed with patient today.  Reviewed by clinician on day of visit: allergies, medications, problem list, medical history, surgical history, family history, social history, and previous encounter notes.  During the visit, I independently reviewed the patient's EKG, bioimpedance scale results, and indirect calorimeter results. I used this information to tailor a meal plan for the patient that will help Phyllis Washington to lose weight and will improve her obesity-related conditions going forward.  I performed a medically necessary appropriate examination and/or evaluation. I discussed the assessment and treatment plan with the patient. The patient was provided an opportunity to ask questions and all were answered. The patient agreed with the plan and demonstrated an understanding of the instructions. Labs were ordered today (unless  patient declined them) and will be reviewed with the patient at our next visit unless more critical results need to be addressed immediately. Clinical information was updated and documented in the EMR.    Objective:   PHYSICAL EXAM: Blood pressure 128/84, pulse 69, temperature 98.7 F (37.1 C), height 5' 6 (1.676 m), weight 206 lb (93.4 kg), SpO2 98%. Body mass index is 33.25 kg/m.  General: Well Developed, well nourished, and in no acute distress.  HEENT: Normocephalic, atraumatic; EOMI, sclerae are anicteric. Skin: Warm and dry, good turgor Chest:  Normal excursion, shape, no gross ABN Respiratory: No conversational dyspnea; speaking in full sentences NeuroM-Sk:  Normal gross ROM * 4 extremities  Psych: A and O *3, insight adequate, mood- full  Anthropometric Measurements Height: 5' 6 (1.676 m) Weight: 206 lb (93.4 kg) BMI (Calculated): 33.27 Weight at Last Visit: 0lb Weight Lost Since Last Visit: 0lb Weight Gained Since Last Visit: 0lb Starting Weight: 206lb Total Weight Loss (lbs): 0 lb (0 kg) Peak Weight: 230lb Waist Measurement : 46 inches   Body Composition  Body Fat %: 43.4 % Fat Mass (lbs): 89.6 lbs Muscle Mass (lbs): 110.8 lbs Total Body Water (lbs): 79.8 lbs Visceral Fat Rating : 10   Other Clinical Data RMR: [id number] Fasting: Yes Labs: Yes Today's Visit #: 1 Starting Date: 12/25/23 Comments: First Visit    DIAGNOSTIC DATA REVIEWED:  BMET    Component Value Date/Time   NA 138 11/08/2023 1041   K 4.5 11/08/2023 1041   CL 106 11/08/2023 1041   CO2 24 11/08/2023 1041   GLUCOSE 103 (H) 11/08/2023 1041   BUN 14 11/08/2023 1041   CREATININE 0.85 11/08/2023 1041   CREATININE 0.77 09/18/2013 1024   CALCIUM 9.4 11/08/2023 1041   GFRNONAA >60 04/08/2008 1319   GFRAA  04/08/2008 1319    >60        The eGFR has been calculated using the MDRD equation. This calculation has not been validated in all clinical   Lab Results  Component Value Date    HGBA1C 5.4 11/08/2023   HGBA1C 5.9 12/24/2015   No results found for: INSULIN  Lab Results  Component Value Date   TSH 4.00 11/08/2023   CBC    Component Value Date/Time   WBC 9.1 11/08/2023 1041   RBC 4.35 11/08/2023 1041   HGB 12.7 11/08/2023 1041   HCT 38.9 11/08/2023 1041   PLT 315.0 11/08/2023 1041   MCV 89.4 11/08/2023 1041   MCH 30.2 09/18/2013 1024   MCHC 32.6 11/08/2023 1041   RDW 13.7 11/08/2023 1041   Iron Studies No results found for: IRON, TIBC, FERRITIN, IRONPCTSAT Lipid Panel     Component Value Date/Time   CHOL 208 (H) 11/08/2023 1041   TRIG 124.0 11/08/2023 1041   HDL 41.40 11/08/2023 1041   CHOLHDL 5 11/08/2023 1041   VLDL 24.8 11/08/2023 1041   LDLCALC 142 (H) 11/08/2023 1041   LDLDIRECT 138.0 10/02/2022 0959   Hepatic Function Panel     Component Value Date/Time   PROT 6.7 11/08/2023 1041   ALBUMIN 4.6 11/08/2023 1041   AST 18 11/08/2023 1041   ALT 12 11/08/2023 1041   ALKPHOS 70 11/08/2023 1041   BILITOT 0.5 11/08/2023 1041   BILIDIR 0.1 09/18/2013 1024   IBILI 0.2 09/18/2013 1024      Component Value Date/Time   TSH 4.00 11/08/2023 1041   Nutritional No results found for: VD25OH  Attestation Statements:   I, Vernell Forest, acting as a medical scribe for Phyllis Jenkins, DO., have compiled all relevant documentation for today's office visit on behalf of Phyllis Jenkins, DO, while in the presence of [employer], DO.  I have spent *** minutes in the care of the patient today including: *** minutes face-to-face assessing and reviewing listed medical problems above as outlined in office visit note, providing nutritional and behavioral counseling as outlined in obesity care plan, independently interpreting results and goals of care, see listed medical problems, and discussing biometric information and progress. *** minutes was spent on review of chart/new patient packet and additional post-visit documentation.  I have reviewed  the above documentation for accuracy and completeness, and I agree with the above. Phyllis JINNY Washington, D.O.  The 21st Century Cures Act  was signed into law in 2016 which includes the topic of electronic health records.  This provides immediate access to information in MyChart.  This includes consultation notes, operative notes, office notes, lab results and pathology reports.  If you have any questions about what you read please let us  know at your next visit so we can discuss your concerns and take corrective action if need be.  We are right here with you.

## 2023-12-26 LAB — VITAMIN B12: Vitamin B-12: 582 pg/mL (ref 232–1245)

## 2023-12-26 LAB — COMPREHENSIVE METABOLIC PANEL WITH GFR
ALT: 11 IU/L (ref 0–32)
AST: 16 IU/L (ref 0–40)
Albumin: 4.3 g/dL (ref 3.9–4.9)
Alkaline Phosphatase: 79 IU/L (ref 44–121)
BUN/Creatinine Ratio: 15 (ref 9–23)
BUN: 13 mg/dL (ref 6–24)
Bilirubin Total: 0.2 mg/dL (ref 0.0–1.2)
CO2: 20 mmol/L (ref 20–29)
Calcium: 9.2 mg/dL (ref 8.7–10.2)
Chloride: 106 mmol/L (ref 96–106)
Creatinine, Ser: 0.84 mg/dL (ref 0.57–1.00)
Globulin, Total: 2 g/dL (ref 1.5–4.5)
Glucose: 98 mg/dL (ref 70–99)
Potassium: 4.7 mmol/L (ref 3.5–5.2)
Sodium: 141 mmol/L (ref 134–144)
Total Protein: 6.3 g/dL (ref 6.0–8.5)
eGFR: 87 mL/min/{1.73_m2} (ref >59)

## 2023-12-26 LAB — TSH: TSH: 2.71 u[IU]/mL (ref 0.450–4.500)

## 2023-12-26 LAB — LIPID PANEL
Chol/HDL Ratio: 4.1 ratio (ref 0.0–4.4)
Cholesterol, Total: 197 mg/dL (ref 100–199)
HDL: 48 mg/dL (ref >39)
LDL Chol Calc (NIH): 135 mg/dL — ABNORMAL HIGH (ref 0–99)
Triglycerides: 76 mg/dL (ref 0–149)
VLDL Cholesterol Cal: 14 mg/dL (ref 5–40)

## 2023-12-26 LAB — T4, FREE: Free T4: 0.81 ng/dL — ABNORMAL LOW (ref 0.82–1.77)

## 2023-12-26 LAB — CBC WITH DIFFERENTIAL/PLATELET
Basophils Absolute: 0 10*3/uL (ref 0.0–0.2)
Basos: 1 %
EOS (ABSOLUTE): 0.3 10*3/uL (ref 0.0–0.4)
Eos: 3 %
Hematocrit: 38.4 % (ref 34.0–46.6)
Hemoglobin: 12.1 g/dL (ref 11.1–15.9)
Immature Grans (Abs): 0 10*3/uL (ref 0.0–0.1)
Immature Granulocytes: 0 %
Lymphocytes Absolute: 2.2 10*3/uL (ref 0.7–3.1)
Lymphs: 25 %
MCH: 28.5 pg (ref 26.6–33.0)
MCHC: 31.5 g/dL (ref 31.5–35.7)
MCV: 91 fL (ref 79–97)
Monocytes Absolute: 0.4 10*3/uL (ref 0.1–0.9)
Monocytes: 4 %
Neutrophils Absolute: 5.8 10*3/uL (ref 1.4–7.0)
Neutrophils: 67 %
Platelets: 285 10*3/uL (ref 150–450)
RBC: 4.24 x10E6/uL (ref 3.77–5.28)
RDW: 12.3 % (ref 11.7–15.4)
WBC: 8.7 10*3/uL (ref 3.4–10.8)

## 2023-12-26 LAB — HEMOGLOBIN A1C
Est. average glucose Bld gHb Est-mCnc: 105 mg/dL
Hgb A1c MFr Bld: 5.3 % (ref 4.8–5.6)

## 2023-12-26 LAB — INSULIN, RANDOM: INSULIN: 8.9 u[IU]/mL (ref 2.6–24.9)

## 2023-12-26 LAB — FOLATE: Folate: 5.9 ng/mL (ref >3.0)

## 2024-01-03 ENCOUNTER — Ambulatory Visit (INDEPENDENT_AMBULATORY_CARE_PROVIDER_SITE_OTHER): Admitting: Family Medicine

## 2024-01-03 VITALS — BP 122/80 | HR 73 | Resp 16 | Ht 66.0 in | Wt 207.6 lb

## 2024-01-03 DIAGNOSIS — F418 Other specified anxiety disorders: Secondary | ICD-10-CM | POA: Diagnosis not present

## 2024-01-03 DIAGNOSIS — R739 Hyperglycemia, unspecified: Secondary | ICD-10-CM

## 2024-01-03 DIAGNOSIS — E6609 Other obesity due to excess calories: Secondary | ICD-10-CM

## 2024-01-03 DIAGNOSIS — Z79899 Other long term (current) drug therapy: Secondary | ICD-10-CM | POA: Diagnosis not present

## 2024-01-03 MED ORDER — NITROGLYCERIN 0.4 % RE OINT: TOPICAL_OINTMENT | RECTAL | 1 refills | Status: AC

## 2024-01-04 ENCOUNTER — Encounter: Payer: Self-pay | Admitting: Family Medicine

## 2024-01-04 NOTE — Assessment & Plan Note (Signed)
 hgba1c acceptable, minimize simple carbs. Increase exercise as tolerated.
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
## 2024-01-04 NOTE — Assessment & Plan Note (Signed)
 Encouraged DASH or MIND diet, decrease po intake and increase exercise as tolerated. Needs 7-8 hours of sleep nightly. Avoid trans fats, eat small, frequent meals every 4-5 hours with lean proteins, complex carbs and healthy fats. Minimize simple carbs, high fat foods and processed foods. She has been engaging in intermittent fasting.

## 2024-01-08 ENCOUNTER — Encounter (INDEPENDENT_AMBULATORY_CARE_PROVIDER_SITE_OTHER): Payer: Self-pay | Admitting: Family Medicine

## 2024-01-08 ENCOUNTER — Ambulatory Visit (INDEPENDENT_AMBULATORY_CARE_PROVIDER_SITE_OTHER): Admitting: Family Medicine

## 2024-01-08 VITALS — BP 115/75 | HR 64 | Temp 98.0°F | Ht 66.0 in | Wt 205.0 lb

## 2024-01-08 DIAGNOSIS — E782 Mixed hyperlipidemia: Secondary | ICD-10-CM

## 2024-01-08 DIAGNOSIS — R7989 Other specified abnormal findings of blood chemistry: Secondary | ICD-10-CM

## 2024-01-08 DIAGNOSIS — E88819 Insulin resistance, unspecified: Secondary | ICD-10-CM | POA: Diagnosis not present

## 2024-01-08 DIAGNOSIS — Z6833 Body mass index (BMI) 33.0-33.9, adult: Secondary | ICD-10-CM

## 2024-01-08 NOTE — Patient Instructions (Signed)
 The 10-year ASCVD risk score (Arnett DK, et al., 2019) is: 2.7%   Values used to calculate the score:     Age: 43 years     Clincally relevant sex: Female     Is Non-Hispanic African American: No     Diabetic: No     Tobacco smoker: Yes     Systolic Blood Pressure: 115 mmHg     Is BP treated: No     HDL Cholesterol: 48 mg/dL     Total Cholesterol: 197 mg/dL

## 2024-01-08 NOTE — Progress Notes (Signed)
 Phyllis Washington, D.O.  ABFM, ABOM Clinical Bariatric Medicine Physician  Office located at: [address] [place], [place]  [address]   Assessment and Plan:   FOR THE DISEASE OF OBESITY:  BMI 33.0-33.9,adult 33.09 Morbid obesity starting [facility]) 33.25 Assessment & Plan: Since last office visit on 12/25/2023 patient's  Muscle mass has increased by 0.8 lb. Fat mass has decreased by 2.2 lb. Total body water has increased by 0.2 lb.  Counseling done on how various foods will affect these numbers and how to maximize success  Total lbs lost to date: - 1 lb Total weight loss percentage to date: -0.49%    Recommended Dietary Goals Phyllis Washington is currently in the action stage of change. As such, her goal is to continue weight management plan.  She has agreed to the Category 2 meal plan; she was provided breakfast options today.    Behavioral Intervention We discussed the following today: increasing lean protein intake to established goals, decreasing simple carbohydrates , increasing water intake , and continue to work on implementation of reduced calorie nutritional plan  Additional resources provided today: Handout on CAT 2 meal plan, Handout on CAT 1-2 breakfast options, Handout on protein equivalents of 2 ounces of meat or seafood, Handout on insulin  resistance education, and Handout on Common Characteristics of Successful Weight Losers,  Evidence-based interventions for health behavior change were utilized today including the discussion of self monitoring techniques, problem-solving barriers and SMART goal setting techniques.  Regarding patient's less desirable eating habits and patterns, we employed the technique of small changes.   Pt will specifically work on: n/a   Recommended Physical Activity Goals [name] has been advised to work up to 150 minutes of moderate intensity aerobic activity a week and strengthening exercises 2-3 times per week for cardiovascular health, weight  loss maintenance and preservation of muscle mass.   She has agreed to : continue to gradually increase the amount and intensity of exercise routine   Pharmacotherapy We both agreed to : Continue with current nutritional and behavioral strategies   ASSOCIATED CONDITIONS ADDRESSED TODAY:  All recent blood work that we ordered on 12/25/2023 was reviewed with patient today.  Patient was counseled on all abnormalities and we discussed dietary and lifestyle changes that could help those values (also medications when appropriate).  Extensive health counseling performed and all patient's concerns/ questions were addressed.     Insulin  resistance - new onset Assessment & Plan: Lab Results  Component Value Date   HGBA1C 5.3 12/25/2023   HGBA1C 5.4 11/08/2023   HGBA1C 5.5 08/29/2023   INSULIN  8.9 12/25/2023    Lab Results  Component Value Date   CREATININE 0.84 12/25/2023   BUN 13 12/25/2023   NA 141 12/25/2023   K 4.7 12/25/2023   CL 106 12/25/2023   CO2 20 12/25/2023      Component Value Date/Time   PROT 6.3 12/25/2023 1113   ALBUMIN 4.3 12/25/2023 1113   AST 16 12/25/2023 1113   ALT 11 12/25/2023 1113   ALKPHOS 79 12/25/2023 1113   BILITOT 0.2 12/25/2023 1113   BILIDIR 0.1 09/18/2013 1024   IBILI 0.2 09/18/2013 1024    Lab Results  Component Value Date   WBC 8.7 12/25/2023   HGB 12.1 12/25/2023   HCT 38.4 12/25/2023   MCV 91 12/25/2023   PLT 285 12/25/2023   Lab Results  Component Value Date   VITAMINB12 582 12/25/2023   Lab Results  Component Value Date   FOLATE  5.9 12/25/2023    Hemoglobin A1c in normal range. Fasting insulin  is 9 (eventual goal <5). Good control of hunger and cravings. Kidney function, electrolytes, liver enzymes, blood counts, B12, and folate are WNL.   - Patient aware of disease state and risk of progression.  - Explained role of simple carbs and insulin  levels on hunger and cravings.  - Educated patient that having adequate amounts of  protein with each meal is important for increasing muscle mass, stabilizing sugars, controlling hunger and cravings, and improving thermogenesis.  - Ongoing reduction in adipose tissue can improve insulin  resistance. - Continue with reduced calorie meal plan low on processed crabs and simple sugars. - Adequate daily water intake encouraged; drink approximately half of your body weight in ounces of water daily. Additionally, have an extra bottle of water for every 30 minutes of exercise.     Hyperlipidemia, mixed Assessment & Plan: Lab Results  Component Value Date   CHOL 197 12/25/2023   HDL 48 12/25/2023   LDLCALC 135 (H) 12/25/2023   LDLDIRECT 138.0 10/02/2022   TRIG 76 12/25/2023   CHOLHDL 4.1 12/25/2023   The 10-year ASCVD risk score (Arnett DK, et al., 2019) is: 2.7%   Values used to calculate the score:     Age: 43 years     Clincally relevant sex: Female     Is Non-Hispanic African American: No     Diabetic: No     Tobacco smoker: Yes     Systolic Blood Pressure: 115 mmHg     Is BP treated: No     HDL Cholesterol: 48 mg/dL     Total Cholesterol: 197 mg/dL  Managed with diet and life style interventions. Reviewed most recent lipid panel which was WNL w/expection of LDL. LDL goal <100. No concerns with ASCVD risk score.   - Avoid fatty meats, butters, creams, etc.  - Limit red meat intake to no more than two days a week.  - Losing 10% of body weight may improve LDL value.  - Discussed that increasing exercise and consuming moderate amounts of healthy fats can raise HDL.     h/o Elevated TSH Assessment & Plan: Lab Results  Component Value Date   TSH 2.710 12/25/2023   FREET4 0.81 (L) 12/25/2023    TSH was elevated at 6.99 on 08/2023. Most recent TSH was at goal. Free T4 has improved from 0.58 to 0.81. Overall labs are reassuring.   - Recheck thyroid  levels in 4 months.    FOLLOW UP:   Return 01/22/2024 at 10:40 AM. She was informed of the importance of frequent  follow up visits to maximize her success with intensive lifestyle modifications for her multiple health conditions.   Subjective:   Chief complaint: Obesity Phyllis Washington is here to discuss her progress with her obesity treatment plan. She is on the the Category 2 Plan and states she is following her eating plan approximately 80 % of the time. She states she is doing 60 minutes of weight training and 60 minutes of cardio 3 days per week.   Interval History:  Phyllis Washington is here today for her first follow-up office visit since starting the program with us . Patient is off to a good start and has lost 1 lb. States that everything on her meal plan was okay besides the eggs for breakfast. She inquires about additional breakfast  choices. She is measuring her intake of proteins. Has yogurt or fresh berries for her snack calories. Denies excessive hunger and  cravings.    Pharmacotherapy for weight loss: none    Review of Systems:  Pertinent positives were addressed with patient today.   Reviewed by clinician on day of visit: allergies, medications, problem list, medical history, surgical history, family history, social history, and previous encounter notes.   Weight Summary and Biometrics   Weight Lost Since Last Visit: 1lb  Weight Gained Since Last Visit: 0lb   Vitals Temp: 98 F (36.7 C) BP: 115/75 Pulse Rate: 64 SpO2: 99 %   Anthropometric Measurements Height: 5' 6 (1.676 m) Weight: 205 lb (93 kg) BMI (Calculated): 33.1 Weight at Last Visit: 206lb Weight Lost Since Last Visit: 1lb Weight Gained Since Last Visit: 0lb Starting Weight: 206lb Total Weight Loss (lbs): 1 lb (0.454 kg) Peak Weight: 230lb Waist Measurement : 46 inches   Body Composition  Body Fat %: 42.6 % Fat Mass (lbs): 87.4 lbs Muscle Mass (lbs): 11.6 lbs Total Body Water (lbs): 80 lbs Visceral Fat Rating : 10   Other Clinical Data RMR: [id number] Fasting: No Labs: no Today's Visit #: 2 Starting Date:  12/25/23 Comments: Cat 2   Objective:   PHYSICAL EXAM:  Blood pressure 115/75, pulse 64, temperature 98 F (36.7 C), height 5' 6 (1.676 m), weight 205 lb (93 kg), last menstrual period 12/03/2023, SpO2 99%. Body mass index is 33.09 kg/m.  General: she is overweight, cooperative and in no acute distress.   HEENT: EOMI, sclerae are anicteric. Lungs: Normal breathing effort, no conversational dyspnea. M-Sk:  Normal gross ROM * 4 extremities  PSYCH: Has normal mood, affect and thought process. Neurologic: No gross sensory or motor deficits. Well developed, A and O * 3  DIAGNOSTIC DATA REVIEWED:  BMET    Component Value Date/Time   NA 141 12/25/2023 1113   K 4.7 12/25/2023 1113   CL 106 12/25/2023 1113   CO2 20 12/25/2023 1113   GLUCOSE 98 12/25/2023 1113   GLUCOSE 103 (H) 11/08/2023 1041   BUN 13 12/25/2023 1113   CREATININE 0.84 12/25/2023 1113   CREATININE 0.77 09/18/2013 1024   CALCIUM 9.2 12/25/2023 1113   GFRNONAA >60 04/08/2008 1319   GFRAA  04/08/2008 1319    >60        The eGFR has been calculated using the MDRD equation. This calculation has not been validated in all clinical   Lab Results  Component Value Date   HGBA1C 5.3 12/25/2023   HGBA1C 5.9 12/24/2015   Lab Results  Component Value Date   INSULIN  8.9 12/25/2023   Lab Results  Component Value Date   TSH 2.710 12/25/2023   CBC    Component Value Date/Time   WBC 8.7 12/25/2023 1113   WBC 9.1 11/08/2023 1041   RBC 4.24 12/25/2023 1113   RBC 4.35 11/08/2023 1041   HGB 12.1 12/25/2023 1113   HCT 38.4 12/25/2023 1113   PLT 285 12/25/2023 1113   MCV 91 12/25/2023 1113   MCH 28.5 12/25/2023 1113   MCH 30.2 09/18/2013 1024   MCHC 31.5 12/25/2023 1113   MCHC 32.6 11/08/2023 1041   RDW 12.3 12/25/2023 1113   Iron Studies No results found for: IRON, TIBC, FERRITIN, IRONPCTSAT Lipid Panel     Component Value Date/Time   CHOL 197 12/25/2023 1113   TRIG 76 12/25/2023 1113   HDL 48  12/25/2023 1113   CHOLHDL 4.1 12/25/2023 1113   CHOLHDL 5 11/08/2023 1041   VLDL 24.8 11/08/2023 1041   LDLCALC 135 (H) 12/25/2023 1113   LDLDIRECT  138.0 10/02/2022 0959   Hepatic Function Panel     Component Value Date/Time   PROT 6.3 12/25/2023 1113   ALBUMIN 4.3 12/25/2023 1113   AST 16 12/25/2023 1113   ALT 11 12/25/2023 1113   ALKPHOS 79 12/25/2023 1113   BILITOT 0.2 12/25/2023 1113   BILIDIR 0.1 09/18/2013 1024   IBILI 0.2 09/18/2013 1024      Component Value Date/Time   TSH 2.710 12/25/2023 1113   Nutritional No results found for: VD25OH  Attestations:   I, Special Puri, acting as a Stage manager for [employer], DO., have compiled all relevant documentation for today's office visit on behalf of Phyllis Jenkins, DO, while in the presence of [employer], DO.  I have spent 47 minutes in the care of the patient today including 36 minutes on face-to-face counseling and reviewing listed medical problems above as outlined in office visit note, providing nutritional and behavioral counseling as outlined in obesity care plan, independently interpreting results and goals of care, see listed medical problems, and discussing biometric information and progress. We reviewed her meal plan and discussed how the foods she's eating is affecting each one of her labs. Pt educated on why we want her to eat various foods in various amounts and has a better understanding of the nutritional plan because of this.  I have reviewed the above documentation for accuracy and completeness, and I agree with the above. Phyllis Phyllis Washington, D.O.  The 21st Century Cures Act was signed into law in 2016 which includes the topic of electronic health records.  This provides immediate access to information in MyChart.  This includes consultation notes, operative notes, office notes, lab results and pathology reports.  If you have any questions about what you read please let us  know at your next visit so  we can discuss your concerns and take corrective action if need be.  We are right here with you.

## 2024-01-09 ENCOUNTER — Telehealth: Payer: Self-pay | Admitting: Family Medicine

## 2024-01-09 NOTE — Telephone Encounter (Signed)
 Good afternoon this pt needs lab order for repeat tsh and t4

## 2024-01-09 NOTE — Telephone Encounter (Signed)
 I have a note to call her and cancel lab appointment because she just had it checked on 12/25/23.

## 2024-01-10 ENCOUNTER — Ambulatory Visit: Payer: Medicare Other | Admitting: Dermatology

## 2024-01-10 ENCOUNTER — Other Ambulatory Visit

## 2024-01-10 ENCOUNTER — Encounter: Payer: Self-pay | Admitting: Dermatology

## 2024-01-10 VITALS — BP 120/80 | HR 59

## 2024-01-10 DIAGNOSIS — D229 Melanocytic nevi, unspecified: Secondary | ICD-10-CM | POA: Diagnosis not present

## 2024-01-10 DIAGNOSIS — L814 Other melanin hyperpigmentation: Secondary | ICD-10-CM

## 2024-01-10 DIAGNOSIS — L821 Other seborrheic keratosis: Secondary | ICD-10-CM

## 2024-01-10 DIAGNOSIS — D1801 Hemangioma of skin and subcutaneous tissue: Secondary | ICD-10-CM

## 2024-01-10 DIAGNOSIS — Z1283 Encounter for screening for malignant neoplasm of skin: Secondary | ICD-10-CM

## 2024-01-10 DIAGNOSIS — L732 Hidradenitis suppurativa: Secondary | ICD-10-CM

## 2024-01-10 DIAGNOSIS — L578 Other skin changes due to chronic exposure to nonionizing radiation: Secondary | ICD-10-CM

## 2024-01-10 DIAGNOSIS — W908XXA Exposure to other nonionizing radiation, initial encounter: Secondary | ICD-10-CM

## 2024-01-10 MED ORDER — CLINDAMYCIN PHOSPHATE 1 % EX LOTN: TOPICAL_LOTION | Freq: Every day | CUTANEOUS | 6 refills | Status: DC

## 2024-01-10 NOTE — Patient Instructions (Signed)

## 2024-01-10 NOTE — Progress Notes (Signed)
 Total Body Skin Exam (TBSE) Visit   Subjective  Phyllis Washington is a 43 y.o. female who presents for the following: Skin Cancer Screening and Full Body Skin Exam  Patient presents today for follow up visit for TBSE. Patient was last evaluated on 07/24/23 . Patient denies medication changes. Patient reports she does have spots, moles and lesions of concern to be evaluated (Skin Tags). Patient reports throughout her lifetime she has had moderate sun exposure. Currently, patient reports if she has excessive sun exposure, she does apply sunscreen and/or wears protective coverings. Patient reports she has hx of bx (Left Nasal Wall, bx proven AK). Patient reports  family history of skin cancers. The patient has spots, moles and lesions to be evaluated, some may be new or changing and the patient has concerns that these could be cancer.  The following portions of the chart were reviewed this encounter and updated as appropriate: medications, allergies, medical history  Review of Systems:  No other skin or systemic complaints except as noted in HPI or Assessment and Plan.  Objective  Well appearing patient in no apparent distress; mood and affect are within normal limits.  A full examination was performed including scalp, head, eyes, ears, nose, lips, neck, chest, axillae, abdomen, back, buttocks, bilateral upper extremities, bilateral lower extremities, hands, feet, fingers, toes, fingernails, and toenails. All findings within normal limits unless otherwise noted below.   Relevant physical exam findings are noted in the Assessment and Plan.    Assessment & Plan   LENTIGINES, SEBORRHEIC KERATOSES, HEMANGIOMAS - Benign normal skin lesions - Benign-appearing - Call for any changes  MELANOCYTIC NEVI - Tan-brown and/or pink-flesh-colored symmetric macules and papules - Benign appearing on exam today - Observation - Call clinic for new or changing moles - Recommend daily use of broad spectrum  spf 30+ sunscreen to sun-exposed areas.   ACTINIC DAMAGE - Chronic condition, secondary to cumulative UV/sun exposure - diffuse scaly erythematous macules with underlying dyspigmentation - Recommend daily broad spectrum sunscreen SPF 30+ to sun-exposed areas, reapply every 2 hours as needed.  - Staying in the shade or wearing long sleeves, sun glasses (UVA+UVB protection) and wide brim hats (4-inch brim around the entire circumference of the hat) are also recommended for sun protection.  - Call for new or changing lesions.  HIDRADENITIS SUPPURATIVA Exam: hyperpigmented macules ans scars in BL axillea in areas of prior flares  Not at goal  Patient Education Discussed During Visit: Hidradenitis Suppurativa is a chronic; persistent; non-curable, but treatable condition due to abnormal inflamed sweat glands in the body folds (axilla, inframammary, groin, medial thighs), causing recurrent painful draining cysts and scarring. It can be associated with severe scarring acne and cysts; also abscesses and scarring of scalp. The goal is control and prevention of flares, as it is not curable. Scars are permanent and can be thickened. Treatment may include daily use of topical medication and oral antibiotics.  Oral isotretinoin may also be helpful.  For some cases, Humira or Cosentyx (biologic injections) may be prescribed to decrease the inflammatory process and prevent flares.  When indicated, inflamed cysts may also be treated surgically.  Treatment Plan: - Prescribed Clindamycin  to be applied daily and recommended BPO wash daily   SKIN CANCER SCREENING PERFORMED TODAY.  HIDRADENITIS SUPPURATIVA   Related Medications clindamycin  (CLEOCIN -T) 1 % lotion Apply topically daily.  Return in about 6 months (around 07/12/2024) for HS F/U.  I, Jetta Ager, am acting as Neurosurgeon for [employer], DO.  Documentation: I have reviewed the above documentation for accuracy and completeness, and I agree with  the above.  Delon Lenis, DO

## 2024-01-13 NOTE — Progress Notes (Signed)
 Phyllis Washington, D.O.  ABFM, ABOM Specializing in Clinical Bariatric Medicine Office located at: [address]  [place], [place]  [address]   Bariatric Medicine Visit  Dear Phyllis Washington LABOR, MD   Thank you for referring Phyllis Washington to our clinic today for evaluation.  We performed a consultation to discuss her options for treatment and educate the patient on her disease state.  The following note includes my evaluation and treatment recommendations.   Please do not hesitate to reach out to me directly if you have any further concerns.    Assessment and Plan:   Orders Placed This Encounter  Procedures   TSH   T4, free   Lipid panel   Insulin , random   Hemoglobin A1c   Folate   Comprehensive metabolic panel with GFR   Vitamin B12   CBC with Differential/Platelet   EKG 12-Lead    Labs obtained today (CMP w GFR, Folate, A1c, insulin , lipid panel, free T4, TSH, vit B12, and CBC w differential/platelet) will be reviewed at next OV.    FOR THE DISEASE OF OBESITY:  Morbid obesity starting (HCC) 33.25 BMI 33.0-33.9,adult 33.25 Assessment & Plan: Recommended Dietary Goals Phyllis Washington is currently in the action stage of change. As such, her goal is to start our weight management plan.  She has agreed to implement:  Category 2 Meal Plan   Behavioral Intervention We discussed the following Behavioral Modification Strategies today: increasing lean protein intake to established goals, decreasing simple carbohydrates , increasing vegetables, increasing lower glycemic fruits, increasing fiber rich foods, avoiding skipping meals, increasing water intake , keeping healthy foods at home, decreasing eating out or consumption of processed foods, and making healthy choices when eating convenient foods, practice mindfulness eating and understand the difference between hunger signals and cravings, work on managing stress, creating time for self-care and relaxation, and avoiding  temptations and identifying enticing environmental cues. Reviewed needing to be in a 400-500 calorie deficit to lose about 1 lb of body fat a week.  Additional resources provided today: Handout on CAT 2 meal plan  Evidence-based interventions for health behavior change were utilized today including the discussion of self monitoring techniques, problem-solving barriers and SMART goal setting techniques.    Pt will specifically work on: Implementation of meal plan!    Recommended Physical Activity Goals Phyllis Washington has been advised to work up to 150 minutes of moderate intensity aerobic activity a week and strengthening exercises 2-3 times per week for cardiovascular health, weight loss maintenance and preservation of muscle mass.   She has agreed to: Maintain current level of activity.    Pharmacotherapy Of note, pt is on many obesogenic medications. She has spoken with her psychiatrist about her current regimen, which was decided to be clinically necessary. See bipolar affective disorder section below.   We discussed various medication options to help [name] with her weight loss efforts and we both agreed to: Implement nutritional and behavioral strategies discussed today.    ASSOCIATED CONDITIONS ADDRESSED TODAY:  Fatigue Assessment & Plan: Phyllis Washington does feel that her weight is causing her energy to be lower than it should be. Fatigue may be related to obesity, depression or many other causes. she does not appear to have any red flag symptoms and this appears to most likely be related to her current lifestyle habits and dietary intake.  Labs will be ordered and reviewed with her at their next office visit in two weeks.  Orders: - TSH - T4, free -  Lipid panel - Insulin , random - Hemoglobin A1c - Folate - Comprehensive metabolic panel with GFR - Vitamin B12 - CBC with Differential/Platelet - EKG 12-Lead   Epworth sleepiness scale is 9 and appears to be OR not be (delete one)  within normal limits. Phyllis Washington denies daytime somnolence and denies waking up still tired, both of which she attributes to he taking her medications. Patient has a history of symptoms of occasional morning headaches once monthly. Phyllis Washington generally gets 8-9 hours of sleep per night, and states that she has generally restful sleep. Snoring is present. Apneic episodes are not present.    ECG: Performed and reviewed/ interpreted independently.  Normal sinus rhythm, rate 66 bpm; reassuring without any acute abnormalities, will continue to monitor for symptoms    Shortness of breath on exertion Assessment & Plan: Phyllis Washington does feel that she gets out of breath more easily than she used to when she exercises and seems to be worsening over time with weight gain.  This has gotten worse recently. [name] denies shortness of breath at rest or orthopnea. Pt denies chest pain, dizziness, heart palpitations, or excessive diaphoresis or nausea with activity.  This is not new and is ongoing.  Phyllis Washington's shortness of breath appears to be obesity related and exercise induced, as they do not appear to have any red flag symptoms/ concerns today.  Also, this condition appears to be related to a state of poor cardiovascular conditioning   Obtain labs today and will be reviewed with her at their next office visit in two weeks. See labs ordered above.  Indirect Calorimeter completed today to help guide our dietary regimen. It shows a VO2 of 271 and a REE of 1872.  Her calculated basal metabolic rate is 8365 thus her resting energy expenditure is better than expected.  Patient agreed to work on weight loss at this time.  As [name] progresses through our weight loss program, we will gradually increase exercise as tolerated to treat her current condition.   If Phyllis Washington follows our recommendations and loses 5-10% of their weight without improvement of her shortness of breath or if at any time, symptoms become more concerning, they  agree to urgently follow up with their PCP/ specialist for further consideration/ evaluation.   Phyllis Washington verbalizes agreement with this plan.    Bipolar affective disorder, current episode manic, current episode severity unspecified (HCC) Depression Screen  Assessment & Plan: Formally diagnosed with bipolar affective disorder in her 3s, but notes she had sx way before formal evaluation. Pt is on Zoloft 100 mg once daily, Lithium 600 mg once at bedtime, Seroquel 100 mg nightly, and Risperdal 1 mg nightly. She is also on Gabapentin 300 mg TID; given to her as a mood stabilizer as well as for nerve pain in her right foot. Sees her counselor once every 3 weeks and sees psychiatrist at [facility] every 3 months. After her initial consultation on 10/25/2023, she discussed her medication regimen with her psychiatrist. Per pt, it was mutually decided that her current med regimen is necessary for her bipolar disorder.   Her PHQ-9 score was 12. Moods are stable today. Denies any SI/HI. Per her New Patient Packet, she tends to eat when she is sad and bored. She endorses emotional eating today.   Given she has both a Veterinary surgeon and a psychiatrist, pt will not be referred to Dr. Sharron. Extensive counseling provided on the importance of stress management, exercise, and self-care activities like meditation, breathing exercises, and adequate sleep (7-9  hrs/night) as it relates to mental and physical health. Stressed the importance of following her prudent nutritional meal plan prescribed today and how food can affect moods and trigger emotional eating. Pt encouraged to continue following up with her PCP, counselor, and psychiatrist for continued support, monitoring, and management.    Hyperglycemia Assessment & Plan: Lab Results  Component Value Date   HGBA1C 5.4 11/08/2023   HGBA1C 5.5 08/29/2023   HGBA1C 5.6 05/29/2023    A1c has gradually improve in the last 6 months, as seen above. Pt not currently  on any meds. Has been doing intermittent fasting since 08/2023. Reports uncontrolled hunger and sweet cravings. Skips breakfast daily. Is eating out the home 3-4x/week.   Encouraged pt to follow her prudent nutritional meal plan prescribed to her today --> decrease simple carbs and sugars, increase protein and fiber intake, and increase water intake. Advised pt to avoid skipping meals and reviewed how this may effect metabolism and makes wt loss efforts more challenging. Will continue monitoring condition alongside PCP. Labs obtained today (A1c, insulin , CMP w GFR) will be reviewed at her first follow up.    Hyperlipidemia, mixed Assessment & Plan: Lab Results  Component Value Date   HDL 41.40 11/08/2023   TRIG 124.0 11/08/2023   CHOLHDL 5 11/08/2023   Not currently on any meds. She has been trying intermittent fasting since March. No other restrictive diets. Pt reports enjoying steaks. Eats out the home 3-4x/week.   Follow heart-healthy diet via her meal plan. Reduce saturated/trans fats and avoid eating out or pre-packaged meals high in sodium. Encouraged pt to eat more complex carbs, decreasing simple carb intake. Limit red meat to 2 times per week. Continue current exercise routine. Continue monitoring condition alongside PCP as it relates to her wt loss journey. Will check lipid panel today and review at her first follow up.    Tobacco abuse Assessment & Plan: Pt smokes 1 ppd and has done so for 30+ years. She has tried cutting back on smoking in the past.   Educated pt on health concerns/risks of long term smoking. Reviewed how smoking can contribute to uncontrolled hunger, cravings, and food noise. Encouraged smoking cessation strategies. Advised pt to visit the American Heart Association website or call 1-800-QUIT-NOW for additional education and resources for smoking cessation.     FOLLOW UP:   Follow up in 2 weeks. She was informed of the importance of frequent follow up visits  to maximize her success with intensive lifestyle modifications for her multiple health conditions.  Phyllis Washington is aware that we will review all of her lab results at our next visit.  She is aware that if anything is critical/ life threatening with the results, we will be contacting her via MyChart prior to the office visit to discuss management.    Chief Complaint:   OBESITY Phyllis Washington (MR# [id number]) is a pleasant 43 y.o. female who presents for evaluation and treatment of obesity and related comorbidities. Current BMI is Body mass index is 33.25 kg/m. Phyllis Washington has been struggling with her weight for many years and has been unsuccessful in either losing weight, maintaining weight loss, or reaching her healthy weight goal.  Phyllis Washington is currently in the action stage of change and ready to dedicate time achieving and maintaining a healthier weight. Phyllis Washington is interested in becoming our patient and working on intensive lifestyle modifications including (but not limited to) diet and exercise for weight loss.  Phyllis Washington is on disability for bipolar depression. She is married and lives with her husband.  Does weight lifting about 60 minutes 3 days/week (Mon/Wed/Fri); started about 13 weeks ago.   Desired weight: 160 lbs   Gained wt after having first child in 1999.   Past diets: Atkins, calorie counting, and intermittent fasting.  Started intermittent fasting in March 2025, which has worked best. She has been able to lose 20 lbs when only eating 6 hours per day. It took her 12 weeks to lose that weight and has maintained this weight.   Eating outside the home and fast food/take out 1-3x/week  Craving include sweets.  Snacks in the late afternoons, more than any other time of the day.   Snacks on ice cream.   Skips breakfast daily.   Worst food habit is eating late at night.   Does not drink caloric beverages; drinks black coffee and unsweetened  tea.   Subjective:   This is the patient's first visit at [facility].  The patient's NEW PATIENT PACKET that they filled out prior to today's office visit was reviewed at length and information from that paperwork was included within the following office visit note.    Included in the packet: current and past health history, medications, allergies, ROS, gynecologic history (women only), surgical history, family history, social history, weight history, weight loss surgery history (for those that have had weight loss surgery), nutritional evaluation, mood and food questionnaire along with a depression screening (PHQ9) on all patients, an Epworth questionnaire, sleep habits questionnaire, patient life and health improvement goals questionnaire. These will all be scanned into the patient's chart under the media tab.   Review of Systems: Please refer to new patient packet scanned into media. Pertinent positives were addressed with patient today.  Reviewed by clinician on day of visit: allergies, medications, problem list, medical history, surgical history, family history, social history, and previous encounter notes.  During the visit, I independently reviewed the patient's EKG, bioimpedance scale results, and indirect calorimeter results. I used this information to tailor a meal plan for the patient that will help Phyllis Washington to lose weight and will improve her obesity-related conditions going forward.  I performed a medically necessary appropriate examination and/or evaluation. I discussed the assessment and treatment plan with the patient. The patient was provided an opportunity to ask questions and all were answered. The patient agreed with the plan and demonstrated an understanding of the instructions. Labs were ordered today (unless patient declined them) and will be reviewed with the patient at our next visit unless more critical results need to be addressed immediately.  Clinical information was updated and documented in the EMR.    Objective:   PHYSICAL EXAM: Blood pressure 128/84, pulse 69, temperature 98.7 F (37.1 C), height 5' 6 (1.676 m), weight 206 lb (93.4 kg), SpO2 98%. Body mass index is 33.25 kg/m.  General: Well Developed, well nourished, and in no acute distress.  HEENT: Normocephalic, atraumatic; EOMI, sclerae are anicteric. Skin: Warm and dry, good turgor Chest:  Normal excursion, shape, no gross ABN Respiratory: No conversational dyspnea; speaking in full sentences NeuroM-Sk:  Normal gross ROM * 4 extremities  Psych: A and O *3, insight adequate, mood- full    Anthropometric Measurements Height: 5' 6 (1.676 m) Weight: 206 lb (93.4 kg) BMI (Calculated): 33.27 Weight at Last Visit: 0lb Weight Lost Since Last Visit: 0lb Weight Gained Since Last Visit: 0lb Starting Weight: 206lb Total Weight  Loss (lbs): 0 lb (0 kg) Peak Weight: 230lb Waist Measurement : 46 inches     Body Composition Body Fat %: 43.4 % Fat Mass (lbs): 89.6 lbs Muscle Mass (lbs): 110.8 lbs Total Body Water (lbs): 79.8 lbs Visceral Fat Rating : 10     Other Clinical Data RMR: [id number] Fasting: Yes Labs: Yes Today's Visit #: 1 Starting Date: 12/25/23 Comments: First Visit   DIAGNOSTIC DATA REVIEWED:  BMET    Component Value Date/Time   NA 141 12/25/2023 1113   K 4.7 12/25/2023 1113   CL 106 12/25/2023 1113   CO2 20 12/25/2023 1113   GLUCOSE 98 12/25/2023 1113   GLUCOSE 103 (H) 11/08/2023 1041   BUN 13 12/25/2023 1113   CREATININE 0.84 12/25/2023 1113   CREATININE 0.77 09/18/2013 1024   CALCIUM 9.2 12/25/2023 1113   GFRNONAA >60 04/08/2008 1319   GFRAA  04/08/2008 1319    >60        The eGFR has been calculated using the MDRD equation. This calculation has not been validated in all clinical   Lab Results  Component Value Date   HGBA1C 5.3 12/25/2023   HGBA1C 5.9 12/24/2015   Lab Results  Component Value Date   INSULIN  8.9 12/25/2023    Lab Results  Component Value Date   TSH 2.710 12/25/2023   CBC    Component Value Date/Time   WBC 8.7 12/25/2023 1113   WBC 9.1 11/08/2023 1041   RBC 4.24 12/25/2023 1113   RBC 4.35 11/08/2023 1041   HGB 12.1 12/25/2023 1113   HCT 38.4 12/25/2023 1113   PLT 285 12/25/2023 1113   MCV 91 12/25/2023 1113   MCH 28.5 12/25/2023 1113   MCH 30.2 09/18/2013 1024   MCHC 31.5 12/25/2023 1113   MCHC 32.6 11/08/2023 1041   RDW 12.3 12/25/2023 1113   Iron Studies No results found for: IRON, TIBC, FERRITIN, IRONPCTSAT Lipid Panel     Component Value Date/Time   CHOL 197 12/25/2023 1113   TRIG 76 12/25/2023 1113   HDL 48 12/25/2023 1113   CHOLHDL 4.1 12/25/2023 1113   CHOLHDL 5 11/08/2023 1041   VLDL 24.8 11/08/2023 1041   LDLCALC 135 (H) 12/25/2023 1113   LDLDIRECT 138.0 10/02/2022 0959   Hepatic Function Panel     Component Value Date/Time   PROT 6.3 12/25/2023 1113   ALBUMIN 4.3 12/25/2023 1113   AST 16 12/25/2023 1113   ALT 11 12/25/2023 1113   ALKPHOS 79 12/25/2023 1113   BILITOT 0.2 12/25/2023 1113   BILIDIR 0.1 09/18/2013 1024   IBILI 0.2 09/18/2013 1024      Component Value Date/Time   TSH 2.710 12/25/2023 1113   Nutritional No results found for: VD25OH  Attestation Statements:   I, Vernell Forest, acting as a medical scribe for Phyllis Jenkins, DO., have compiled all relevant documentation for today's office visit on behalf of Phyllis Jenkins, DO, while in the presence of Phyllis & McLennan, DO.  Reviewed by clinician on day of visit: allergies, medications, problem list, medical history, surgical history, family history, social history, and previous encounter notes pertinent to patient's obesity diagnosis.   I have spent 60 minutes in the care of the patient today including: preparing to see patient (e.g. review and interpretation of tests, old notes ), obtaining and/or reviewing separately obtained history, performing a medically appropriate  examination or evaluation, counseling and educating the patient, ordering medications, test or procedures, documenting clinical information in the electronic or other health care record,  and independently interpreting results and communicating results to the patient, family, or caregiver   I have reviewed the above documentation for accuracy and completeness, and I agree with the above. Phyllis JINNY Washington, D.O.  The 21st Century Cures Act was signed into law in 2016 which includes the topic of electronic health records.  This provides immediate access to information in MyChart.  This includes consultation notes, operative notes, office notes, lab results and pathology reports.    If you have any questions about what you read please let us  know at your next visit so we can discuss your concerns and take corrective action if need be.  We are right here with you.

## 2024-01-22 ENCOUNTER — Encounter (INDEPENDENT_AMBULATORY_CARE_PROVIDER_SITE_OTHER): Payer: Self-pay | Admitting: Family Medicine

## 2024-01-22 ENCOUNTER — Ambulatory Visit (INDEPENDENT_AMBULATORY_CARE_PROVIDER_SITE_OTHER): Admitting: Family Medicine

## 2024-01-22 VITALS — BP 114/75 | HR 61 | Temp 98.1°F | Ht 66.0 in | Wt 201.0 lb

## 2024-01-22 DIAGNOSIS — E88819 Insulin resistance, unspecified: Secondary | ICD-10-CM

## 2024-01-22 DIAGNOSIS — F5089 Other specified eating disorder: Secondary | ICD-10-CM

## 2024-01-22 DIAGNOSIS — Z6832 Body mass index (BMI) 32.0-32.9, adult: Secondary | ICD-10-CM

## 2024-01-22 DIAGNOSIS — E782 Mixed hyperlipidemia: Secondary | ICD-10-CM | POA: Diagnosis not present

## 2024-01-22 NOTE — Progress Notes (Signed)
 Phyllis Washington, D.O.  ABFM, ABOM Specializing in Clinical Bariatric Medicine  Office located at: [address] [place], [place]  [address]   Assessment and Plan:   FOR THE DISEASE OF OBESITY:  BMI 32.0-32.9,adult current 32.44 Morbid obesity starting (HCC) 33.25 Assessment & Plan: Since last office visit on 01/08/2024 patient's fat mass has decreased by 4.4 lbs. Muscle mass has increased by 1 lb. Total body water has decreased by 2 lbs.  Counseling done on how various foods will affect these numbers and how to maximize success  Total lbs lost to date: -5  lbs Total weight loss percentage to date: -2.43 %   Recommended Dietary Goals Phyllis Washington is currently in the action stage of change. As such, her goal is to continue weight management plan.  She has agreed to: continue current plan   Behavioral Intervention We discussed the following today: increasing lean protein intake to established goals, keeping healthy foods at home, and continue to work on implementation of reduced calorie nutritional plan  Additional resources provided today: Handout on protein equivalents of 2 ounces of meat or seafood, Handout on mindful-eating tips.   Evidence-based interventions for health behavior change were utilized today including the discussion of self monitoring techniques, problem-solving barriers and SMART goal setting techniques.   Regarding patient's less desirable eating habits and patterns, we employed the technique of small changes.   Pt will specifically work on practicing mindful eating.    Recommended Physical Activity Goals Phyllis Washington has been advised to work up to 300-450 minutes of moderate intensity aerobic activity a week and strengthening exercises 2-3 times per week for cardiovascular health, weight loss maintenance and preservation of muscle mass.   She has agreed to: continue to gradually increase the amount and intensity of exercise  routine   Pharmacotherapy Continue same regimen.    ASSOCIATED CONDITIONS ADDRESSED TODAY:   Insulin  resistance Assessment & Plan: Lab Results  Component Value Date   HGBA1C 5.3 12/25/2023   HGBA1C 5.4 11/08/2023   HGBA1C 5.5 08/29/2023   INSULIN  8.9 12/25/2023    Managed with diet and life-style interventions. No complaints of excessive hunger and cravings. No acute concerns.   - Patient aware of disease state and risk of progression.  - Continue to decrease simple carbs/ sugars; increase fiber and proteins -> follow her meal plan.   - Advance exercise as able.  - Recheck: 3 months.     Hyperlipidemia, mixed Assessment & Plan: Lab Results  Component Value Date   CHOL 197 12/25/2023   HDL 48 12/25/2023   LDLCALC 135 (H) 12/25/2023   LDLDIRECT 138.0 10/02/2022   TRIG 76 12/25/2023   CHOLHDL 4.1 12/25/2023   The 10-year ASCVD risk score (Arnett DK, et al., 2019) is: 2.7%   Values used to calculate the score:     Age: 43 years     Clincally relevant sex: Female     Is Non-Hispanic African American: No     Diabetic: No     Tobacco smoker: Yes     Systolic Blood Pressure: 114 mmHg     Is BP treated: No     HDL Cholesterol: 48 mg/dL     Total Cholesterol: 197 mg/dL  Managed with diet and life style interventions. Most recent lipid panel was WNL w/expection of LDL. LDL goal <100. No concerns with ASCVD risk score at this time   - Continue to maintain a diet low in saturated and trans fats.  - Advance exercise  as able to eventual goal of 300-450 minutes of moderate intensity aerobic activity a week and strengthening exercises 2-3 times per week.    Other specified eating disorder- emotional eating Assessment & Plan: Had one episode of emotional eating since LOV. Discussed several mindful eating strategies. Educational handout on mindful eating provided. Will continue to monitor expectantly.     Follow up:   Return 02/13/2024 12:20 PM.  She was informed of the  importance of frequent follow up visits to maximize her success with intensive lifestyle modifications for her multiple health conditions.   Subjective:   Chief complaint: Obesity Phyllis Washington is here to discuss her progress with her obesity treatment plan. She is on the Category 2 Plan  with [employer] Options and states she is following her eating plan approximately 80-90% of the time. She states she is doing strength training, cardio, walking, or biking 30-60 minutes 3 days per week.   Interval History:  Phyllis Washington is here for a follow up office visit. Since last OV on 01/08/2024 , she is down 4 lbs. Attributes her weight loss to increasing her exercise. Does well with her breakfast and lunch foods. Her biggest challenge is getting in her proteins/following her meal plan for dinner because her husband wants different foods at that time. States she is reducing her sugar intake.    Pharmacotherapy that aid with weight loss: none    Review of Systems:  Pertinent positives were addressed with patient today.  Reviewed by clinician on day of visit: allergies, medications, problem list, medical history, surgical history, family history, social history, and previous encounter notes.  Weight Summary and Biometrics   Weight Lost Since Last Visit: 4lb  Weight Gained Since Last Visit: 0lb   Vitals Temp: 98.1 F (36.7 C) BP: 114/75 Pulse Rate: 61 SpO2: 100 %   Anthropometric Measurements Height: 5' 6 (1.676 m) Weight: 201 lb (91.2 kg) BMI (Calculated): 32.46 Weight at Last Visit: 205lb Weight Lost Since Last Visit: 4lb Weight Gained Since Last Visit: 0lb Starting Weight: 206lb Total Weight Loss (lbs): 5 lb (2.268 kg) Peak Weight: 230lb Waist Measurement : 46 inches   Body Composition  Body Fat %: 41.2 % Fat Mass (lbs): 83 lbs Muscle Mass (lbs): 112.6 lbs Total Body Water (lbs): 78 lbs Visceral Fat Rating : 9   Other Clinical Data RMR: [id number] Fasting: No Labs:  No Today's Visit #: 3 Starting Date: 12/25/23 Comments: Cat 2    Objective:   PHYSICAL EXAM: Blood pressure 114/75, pulse 61, temperature 98.1 F (36.7 C), height 5' 6 (1.676 m), weight 201 lb (91.2 kg), last menstrual period 12/03/2023, SpO2 100%. Body mass index is 32.44 kg/m.  General: she is overweight, cooperative and in no acute distress. PSYCH: Has normal mood, affect and thought process.   HEENT: EOMI, sclerae are anicteric. Lungs: Normal breathing effort, no conversational dyspnea. Extremities: Moves * 4 Neurologic: A and O * 3, good insight  DIAGNOSTIC DATA REVIEWED: BMET    Component Value Date/Time   NA 141 12/25/2023 1113   K 4.7 12/25/2023 1113   CL 106 12/25/2023 1113   CO2 20 12/25/2023 1113   GLUCOSE 98 12/25/2023 1113   GLUCOSE 103 (H) 11/08/2023 1041   BUN 13 12/25/2023 1113   CREATININE 0.84 12/25/2023 1113   CREATININE 0.77 09/18/2013 1024   CALCIUM 9.2 12/25/2023 1113   GFRNONAA >60 04/08/2008 1319   GFRAA  04/08/2008 1319    >60        The  eGFR has been calculated using the MDRD equation. This calculation has not been validated in all clinical   Lab Results  Component Value Date   HGBA1C 5.3 12/25/2023   HGBA1C 5.9 12/24/2015   Lab Results  Component Value Date   INSULIN  8.9 12/25/2023   Lab Results  Component Value Date   TSH 2.710 12/25/2023   CBC    Component Value Date/Time   WBC 8.7 12/25/2023 1113   WBC 9.1 11/08/2023 1041   RBC 4.24 12/25/2023 1113   RBC 4.35 11/08/2023 1041   HGB 12.1 12/25/2023 1113   HCT 38.4 12/25/2023 1113   PLT 285 12/25/2023 1113   MCV 91 12/25/2023 1113   MCH 28.5 12/25/2023 1113   MCH 30.2 09/18/2013 1024   MCHC 31.5 12/25/2023 1113   MCHC 32.6 11/08/2023 1041   RDW 12.3 12/25/2023 1113   Iron Studies No results found for: IRON, TIBC, FERRITIN, IRONPCTSAT Lipid Panel     Component Value Date/Time   CHOL 197 12/25/2023 1113   TRIG 76 12/25/2023 1113   HDL 48 12/25/2023 1113    CHOLHDL 4.1 12/25/2023 1113   CHOLHDL 5 11/08/2023 1041   VLDL 24.8 11/08/2023 1041   LDLCALC 135 (H) 12/25/2023 1113   LDLDIRECT 138.0 10/02/2022 0959   Hepatic Function Panel     Component Value Date/Time   PROT 6.3 12/25/2023 1113   ALBUMIN 4.3 12/25/2023 1113   AST 16 12/25/2023 1113   ALT 11 12/25/2023 1113   ALKPHOS 79 12/25/2023 1113   BILITOT 0.2 12/25/2023 1113   BILIDIR 0.1 09/18/2013 1024   IBILI 0.2 09/18/2013 1024      Component Value Date/Time   TSH 2.710 12/25/2023 1113   Nutritional No results found for: VD25OH  Attestations:   I, Special Puri, acting as a Stage manager for [employer], DO., have compiled all relevant documentation for today's office visit on behalf of Phyllis Jenkins, DO, while in the presence of [employer], DO.  I have reviewed the above documentation for accuracy and completeness, and I agree with the above. Phyllis Washington, D.O.  The 21st Century Cures Act was signed into law in 2016 which includes the topic of electronic health records.  This provides immediate access to information in MyChart. This includes consultation notes, operative notes, office notes, lab results and pathology reports.  If you have any questions about what you read please let us  know at your next visit so we can discuss your concerns and take corrective action if need be.  We are right here with you.

## 2024-01-28 ENCOUNTER — Ambulatory Visit: Admitting: Family Medicine

## 2024-02-07 ENCOUNTER — Ambulatory Visit (INDEPENDENT_AMBULATORY_CARE_PROVIDER_SITE_OTHER): Admitting: Family Medicine

## 2024-02-11 ENCOUNTER — Other Ambulatory Visit: Payer: Self-pay | Admitting: Family Medicine

## 2024-02-13 ENCOUNTER — Ambulatory Visit (INDEPENDENT_AMBULATORY_CARE_PROVIDER_SITE_OTHER): Admitting: Family Medicine

## 2024-02-13 ENCOUNTER — Encounter (INDEPENDENT_AMBULATORY_CARE_PROVIDER_SITE_OTHER): Payer: Self-pay | Admitting: Family Medicine

## 2024-02-13 DIAGNOSIS — E88819 Insulin resistance, unspecified: Secondary | ICD-10-CM

## 2024-02-13 DIAGNOSIS — F5089 Other specified eating disorder: Secondary | ICD-10-CM | POA: Diagnosis not present

## 2024-02-13 DIAGNOSIS — Z6832 Body mass index (BMI) 32.0-32.9, adult: Secondary | ICD-10-CM | POA: Diagnosis not present

## 2024-02-13 MED ORDER — METFORMIN HCL 500 MG PO TABS: ORAL_TABLET | ORAL | 0 refills | Status: DC

## 2024-02-13 NOTE — Progress Notes (Signed)
 Phyllis Washington, D.O.  ABFM, ABOM Specializing in Clinical Bariatric Medicine  Office located at: [address] [place], [place]  [address]   Assessment and Plan:   Meds ordered this encounter  Medications   metFORMIN  (GLUCOPHAGE ) 500 MG tablet    Sig: 1 po with lunch daily    Dispense:  30 tablet    Refill:  0    30 d supply;  ** OV for RF **   Do not send RF request      FOR THE DISEASE OF OBESITY:  Morbid obesity starting (HCC) 33.25 BMI 32.0-32.9,adult current 32.78 Assessment & Plan: Since last office visit on 01/22/24 patient's muscle mass has decreased by 1.2 lbs. Fat mass has increased by 2.8 lbs. Total body water has increased by 3.6 lbs.  Body fat % has increased by 1%. Counseling done on how various foods will affect these numbers and how to maximize success  Total lbs lost to date: 3 lbs Total weight loss percentage to date: -1.46 %   Recommended Dietary Goals [name] is currently in the action stage of change. As such, her goal is to continue weight management plan.  She has agreed to: start to Journal 1250-1350 calories/day and 85+ g of protein/day.  (May use Category 2 meal plan w B options as a guide and aim for 6 ounces at lunch).    Behavioral Intervention We discussed the following today: increasing lean protein intake to established goals, decreasing simple carbohydrates , increasing vegetables, increasing fiber rich foods, and work on tracking and journaling calories using tracking application, increase non-starchy veggies, balanced plate concepts, plate portions and how to measure foods and weigh proteins (after cooking weight), splitting meals if struggling to eat protein amounts all at one meal, reviewed different sources of lean proteins beyond meats (legumes), using different journaling apps (LoseIt!, Cronometer; pt decided to stick with using [facility])   Additional resources provided today: Handout on balanced plate concepts.  ,  Handout on risks/ benefits of metformin  and associated Myths of use, and Handout on benefits of metformin  for weight loss  Evidence-based interventions for health behavior change were utilized today including the discussion of self monitoring techniques, problem-solving barriers and SMART goal setting techniques.   Regarding patient's less desirable eating habits and patterns, we employed the technique of small changes.   Pt will specifically work on: [employer] and bring in her log to her next OV.    Recommended Physical Activity Goals [name] has been advised to work up to 300-450 minutes of moderate intensity aerobic activity a week and strengthening exercises 2-3 times per week for cardiovascular health, weight loss maintenance and preservation of muscle mass.   She has agreed to: Continue current level of physical activity  and Increase physical activity in their day and reduce sedentary time (increase NEAT).   Pharmacotherapy We both agreed to: Continue with current nutritional and behavioral strategies   ASSOCIATED CONDITIONS ADDRESSED TODAY:  Insulin  resistance Assessment & Plan: Lab Results  Component Value Date   HGBA1C 5.3 12/25/2023   HGBA1C 5.4 11/08/2023   HGBA1C 5.5 08/29/2023   INSULIN  8.9 12/25/2023    Managed with diet/lifestyle interventions. Pt states she struggles with increased hunger/cravings in the evenings. Pt dislikes the idea of injectables.   Discussed option of starting Metformin  to help with hunger/cravings. Metformin  also helps with insulin  sensitivity and slows the progression from Pre-DM to T2DM. Reviewed risk/benefits. Mutually agreed to INITIATE Metformin  today at 500 mg  once daily. Start by taking 1/2 tab for 4 days then increase to full tab if tolerating well. Recommend she take at lunch for optimal effect/support with evening hunger/cravings. Properly hydrated by drinking 1/2 her body wt in ounces of water. Continue monitoring.     Other  specified eating disorder- emotional eating Assessment & Plan: Hunger/cravings in the evenings. Pt has struggled with mindful eating. Moods are stable. Denies any SI/HI.   Pt being started on Metformin  for insulin  resistance and to curb hunger/cravings. Reminded pt of stress management strategies. Will continue monitoring.     Follow up:   Return for f/u on 03/04/2024 at 2:20 PM. She was informed of the importance of frequent follow up visits to maximize her success with intensive lifestyle modifications for her multiple health conditions.  Subjective:   Chief complaint: Obesity Phyllis Washington is here to discuss her progress with her obesity treatment plan. She is on the Category 2 Plan w B options and states she is following her eating plan approximately 75% of the time. She states she is strength training and cardio (walking/biking) 30 minutes 3 days per week.     Interval History:  Phyllis Washington is here for a follow up office visit. Since last OV on 01/22/24, she is down 2 lbs. She has struggles with eating cups of veggies at dinner. Pt does not eat meats as often. Eating around 4 ounces of protein at dinner.  Pt has been eating nuts as a snack. Struggle with mindful eating.   Pharmacotherapy that aid with weight loss: She is currently taking no anti-obesity medication.    Review of Systems:  Pertinent positives were addressed with patient today.  Reviewed by clinician on day of visit: allergies, medications, problem list, medical history, surgical history, family history, social history, and previous encounter notes.  Weight Summary and Biometrics   Weight Lost Since Last Visit: 0lb  Weight Gained Since Last Visit: 2lb    Vitals Temp: 98.1 F (36.7 C) BP: 102/71 Pulse Rate: 61 SpO2: 97 %   Anthropometric Measurements Height: 5' 6 (1.676 m) Weight: 203 lb (92.1 kg) BMI (Calculated): 32.78 Weight at Last Visit: 201lb Weight Lost Since Last Visit: 0lb Weight Gained Since  Last Visit: 2lb Starting Weight: 206lb Total Weight Loss (lbs): 3 lb (1.361 kg) Peak Weight: 230lb Waist Measurement : 46 inches   Body Composition  Body Fat %: 42.2 % Fat Mass (lbs): 85.8 lbs Muscle Mass (lbs): 111.4 lbs Total Body Water (lbs): 81.6 lbs Visceral Fat Rating : 10   Other Clinical Data Fasting: yes Labs: no Today's Visit #: 4 Starting Date: 12/25/23    Objective:   PHYSICAL EXAM: Blood pressure 102/71, pulse 61, temperature 98.1 F (36.7 C), height 5' 6 (1.676 m), weight 203 lb (92.1 kg), last menstrual period 02/12/2024, SpO2 97%. Body mass index is 32.77 kg/m.  General: she is overweight, cooperative and in no acute distress. PSYCH: Has normal mood, affect and thought process.   HEENT: EOMI, sclerae are anicteric. Lungs: Normal breathing effort, no conversational dyspnea. Extremities: Moves * 4 Neurologic: A and O * 3, good insight  DIAGNOSTIC DATA REVIEWED: BMET    Component Value Date/Time   NA 141 12/25/2023 1113   K 4.7 12/25/2023 1113   CL 106 12/25/2023 1113   CO2 20 12/25/2023 1113   GLUCOSE 98 12/25/2023 1113   GLUCOSE 103 (H) 11/08/2023 1041   BUN 13 12/25/2023 1113   CREATININE 0.84 12/25/2023 1113   CREATININE 0.77 09/18/2013  1024   CALCIUM 9.2 12/25/2023 1113   GFRNONAA >60 04/08/2008 1319   GFRAA  04/08/2008 1319    >60        The eGFR has been calculated using the MDRD equation. This calculation has not been validated in all clinical   Lab Results  Component Value Date   HGBA1C 5.3 12/25/2023   HGBA1C 5.9 12/24/2015   Lab Results  Component Value Date   INSULIN  8.9 12/25/2023   Lab Results  Component Value Date   TSH 2.710 12/25/2023   CBC    Component Value Date/Time   WBC 8.7 12/25/2023 1113   WBC 9.1 11/08/2023 1041   RBC 4.24 12/25/2023 1113   RBC 4.35 11/08/2023 1041   HGB 12.1 12/25/2023 1113   HCT 38.4 12/25/2023 1113   PLT 285 12/25/2023 1113   MCV 91 12/25/2023 1113   MCH 28.5 12/25/2023 1113    MCH 30.2 09/18/2013 1024   MCHC 31.5 12/25/2023 1113   MCHC 32.6 11/08/2023 1041   RDW 12.3 12/25/2023 1113   Iron Studies No results found for: IRON, TIBC, FERRITIN, IRONPCTSAT Lipid Panel     Component Value Date/Time   CHOL 197 12/25/2023 1113   TRIG 76 12/25/2023 1113   HDL 48 12/25/2023 1113   CHOLHDL 4.1 12/25/2023 1113   CHOLHDL 5 11/08/2023 1041   VLDL 24.8 11/08/2023 1041   LDLCALC 135 (H) 12/25/2023 1113   LDLDIRECT 138.0 10/02/2022 0959   Hepatic Function Panel     Component Value Date/Time   PROT 6.3 12/25/2023 1113   ALBUMIN 4.3 12/25/2023 1113   AST 16 12/25/2023 1113   ALT 11 12/25/2023 1113   ALKPHOS 79 12/25/2023 1113   BILITOT 0.2 12/25/2023 1113   BILIDIR 0.1 09/18/2013 1024   IBILI 0.2 09/18/2013 1024      Component Value Date/Time   TSH 2.710 12/25/2023 1113   Nutritional No results found for: VD25OH  Attestations:   I, Vernell Forest, acting as a medical scribe for Phyllis Jenkins, DO., have compiled all relevant documentation for today's office visit on behalf of Phyllis Jenkins, DO, while in the presence of [employer], DO.  I have reviewed the above documentation for accuracy and completeness, and I agree with the above. Phyllis JINNY Washington, D.O.  The 21st Century Cures Act was signed into law in 2016 which includes the topic of electronic health records.  This provides immediate access to information in MyChart.  This includes consultation notes, operative notes, office notes, lab results and pathology reports.  If you have any questions about what you read please let us  know at your next visit so we can discuss your concerns and take corrective action if need be.  We are right here with you.

## 2024-02-21 ENCOUNTER — Ambulatory Visit (INDEPENDENT_AMBULATORY_CARE_PROVIDER_SITE_OTHER): Admitting: Family Medicine

## 2024-02-26 ENCOUNTER — Ambulatory Visit (INDEPENDENT_AMBULATORY_CARE_PROVIDER_SITE_OTHER): Admitting: Family Medicine

## 2024-03-04 ENCOUNTER — Encounter (INDEPENDENT_AMBULATORY_CARE_PROVIDER_SITE_OTHER): Payer: Self-pay | Admitting: Family Medicine

## 2024-03-04 ENCOUNTER — Ambulatory Visit (INDEPENDENT_AMBULATORY_CARE_PROVIDER_SITE_OTHER): Admitting: Family Medicine

## 2024-03-04 VITALS — BP 114/78 | HR 68 | Temp 97.9°F | Ht 66.0 in | Wt 199.0 lb

## 2024-03-04 DIAGNOSIS — E88819 Insulin resistance, unspecified: Secondary | ICD-10-CM

## 2024-03-04 DIAGNOSIS — Z720 Tobacco use: Secondary | ICD-10-CM | POA: Diagnosis not present

## 2024-03-04 DIAGNOSIS — F3110 Bipolar disorder, current episode manic without psychotic features, unspecified: Secondary | ICD-10-CM

## 2024-03-04 DIAGNOSIS — Z6832 Body mass index (BMI) 32.0-32.9, adult: Secondary | ICD-10-CM

## 2024-03-04 MED ORDER — METFORMIN HCL 500 MG PO TABS: ORAL_TABLET | ORAL | 0 refills | Status: DC

## 2024-03-04 NOTE — Progress Notes (Signed)
 Phyllis Washington, D.O.  ABFM, ABOM Specializing in Clinical Bariatric Medicine  Office located at: [address] [place], [place]  [address]   Assessment and Plan:   Medications Discontinued During This Encounter  Medication Reason   metFORMIN  (GLUCOPHAGE ) 500 MG tablet Reorder     Meds ordered this encounter  Medications   metFORMIN  (GLUCOPHAGE ) 500 MG tablet    Sig: 1 po bid w/ meals    Dispense:  60 tablet    Refill:  0    30 d supply;  ** OV for RF **   Do not send RF request      FOR THE DISEASE OF OBESITY:  BMI 32.0-32.9,adult current 32.13 Morbid obesity starting (HCC) 33.25 Assessment & Plan: Since last office visit on 02/13/24 patient's muscle mass has increased by 1.2 lbs. Fat mass has decreased by 5.4 lbs. Total body water has decreased by 4.8 lbs.  Body fat % has decreased by 1.8 %. Counseling done on how various foods will affect these numbers and how to maximize success  Total lbs lost to date: -7 lbs Total weight loss percentage to date: -3.40 %   Recommended Dietary Goals Phyllis Washington is currently in the action stage of change. As such, her goal is to continue weight management plan.  She has agreed to: continue current plan   Behavioral Intervention We discussed the following today: continue to work on maintaining a reduced calorie state, getting the recommended amount of protein, incorporating whole foods, making healthy choices, staying well hydrated and practicing mindfulness when eating.  Additional resources provided today: Handout on Daily Food Journaling Log  Evidence-based interventions for health behavior change were utilized today including the discussion of self monitoring techniques, problem-solving barriers and SMART goal setting techniques.   Regarding patient's less desirable eating habits and patterns, we employed the technique of small changes.   Goal(s) for next OV: n/a    Recommended Physical Activity Goals Phyllis Washington has been  advised to work up to 300-450 minutes of moderate intensity aerobic activity a week and strengthening exercises 2-3 times per week for cardiovascular health, weight loss maintenance and preservation of muscle mass.   She has agreed to: Think about enjoyable ways to increase daily physical activity and overcoming barriers to exercise, Increase physical activity in their day and reduce sedentary time (increase NEAT)., and Increase volume of physical activity to a goal of 240 minutes a week   Pharmacotherapy We both agreed to: Continue with current nutritional and behavioral strategies   ASSOCIATED CONDITIONS ADDRESSED TODAY:  Insulin  resistance Assessment & Plan Lab Results  Component Value Date   HGBA1C 5.3 12/25/2023   HGBA1C 5.4 11/08/2023   HGBA1C 5.5 08/29/2023   INSULIN  8.9 12/25/2023    On Metformin  500 mg once daily. Good compliance and tolerance. Discussed upping the dose due to increase of hunger and craving in the afternoon and evenings. Increase Metformin ; take 1 full tablet at lunch and 0.5 tablet at dinner for 4 days and then increase to 1 full tablet twice daily. Pt was informed about side effects.    Bipolar affective disorder, current episode manic, current episode severity unspecified (HCC) Assessment & Plan Denies emotional eating. Sees her psychiatrist at [facility] approximately q 3 months and counselor once every 3-4 weeks or so. Pt has discussed her obesogenic medication regimen with her psychiatry team and they decided that her current med regimen is necessary for her bipolar disorder. Pt is on Zoloft, Lithium, Seroquel 100 mg  nightly, and Risperdal nightly. She is also on Gabapentin 300 mg TID; for mood and neuropathic pain in R foot.  Mood stable, Pt is aware of the obesogenic nature of her psych medications however, she has done great with shedding fat mass and preserving lean muscle mass. Pt to continue treatment plan per psychiatry for now as they do not  feel a medication change is prudent at this time.    Tobacco abuse Assessment & Plan 1 ppd 8 30++ yrs. not ready to quit. Pt understands detriments to her health and does desire to quit some time in future but just not now. Continue to focus on increasing healthy habits such as eating healthy and increasing water/ exercise. Eventually pt will discuss smoking cess treatments when she is ready with PCP     Follow up:   Return 03/20/24 at 10:40 AM  She was informed of the importance of frequent follow up visits to maximize her success with intensive lifestyle modifications for her multiple health conditions.    Subjective:    Chief complaint: Obesity [name] is here to discuss her progress with her obesity treatment plan. She is journaling with 1250-1350 calories/day and 85+ g of prtoein/day with Cat 2 MP as guide. and states she is following her eating plan approximately 75% of the time. Pt is going to gym 30-60 minutes 3 days per week    Interval History:  Phyllis Washington is here for a follow up office visit. Pt has experienced a weight loss of 4 lbs since last OV on 02/13/24. Pt shares that she feels like she should have lost more weight. She reports that journaling did help her with seeing her calories ranging from [phone] and protein intake of 36.6-121 grams but she was not consistent with continuing journaling. She thinks that it is worse on the weekend since she tends not to eat. Her physical activity was not the best according to her.       Pharmacotherapy that aid with weight loss: She is currently taking Metformin  500 mg twice daily.    Review of Systems:  Pertinent positives were addressed with patient today.  Reviewed by clinician on day of visit: allergies, medications, problem list, medical history, surgical history, family history, social history, and previous encounter notes.  Weight Summary and Biometrics   Weight Lost Since Last Visit: 4lb  Weight Gained Since  Last Visit: 0    Vitals Temp: 97.9 F (36.6 C) BP: 114/78 Pulse Rate: 68 SpO2: 100 %   Anthropometric Measurements Height: 5' 6 (1.676 m) Weight: 199 lb (90.3 kg) BMI (Calculated): 32.13 Weight at Last Visit: 203lb Weight Lost Since Last Visit: 4lb Weight Gained Since Last Visit: 0 Starting Weight: 206lb Total Weight Loss (lbs): 7 lb (3.175 kg) Peak Weight: 230lb Waist Measurement : 46 inches   Body Composition  Body Fat %: 40.4 % Fat Mass (lbs): 80.4 lbs Muscle Mass (lbs): 112.6 lbs Total Body Water (lbs): 76.8 lbs Visceral Fat Rating : 9   Other Clinical Data Fasting: no Labs: no Today's Visit #: 5 Starting Date: 12/25/23    Objective:   PHYSICAL EXAM: Blood pressure 114/78, pulse 68, temperature 97.9 F (36.6 C), height 5' 6 (1.676 m), weight 199 lb (90.3 kg), last menstrual period 02/12/2024, SpO2 100%. Body mass index is 32.12 kg/m.  General: she is overweight, cooperative and in no acute distress. PSYCH: Has normal mood, affect and thought process.   HEENT: EOMI, sclerae are anicteric. Lungs: Normal breathing effort, no  conversational dyspnea. Extremities: Moves * 4 Neurologic: A and O * 3, good insight  DIAGNOSTIC DATA REVIEWED: BMET    Component Value Date/Time   NA 141 12/25/2023 1113   K 4.7 12/25/2023 1113   CL 106 12/25/2023 1113   CO2 20 12/25/2023 1113   GLUCOSE 98 12/25/2023 1113   GLUCOSE 103 (H) 11/08/2023 1041   BUN 13 12/25/2023 1113   CREATININE 0.84 12/25/2023 1113   CREATININE 0.77 09/18/2013 1024   CALCIUM 9.2 12/25/2023 1113   GFRNONAA >60 04/08/2008 1319   GFRAA  04/08/2008 1319    >60        The eGFR has been calculated using the MDRD equation. This calculation has not been validated in all clinical   Lab Results  Component Value Date   HGBA1C 5.3 12/25/2023   HGBA1C 5.9 12/24/2015   Lab Results  Component Value Date   INSULIN  8.9 12/25/2023   Lab Results  Component Value Date   TSH 2.710 12/25/2023    CBC    Component Value Date/Time   WBC 8.7 12/25/2023 1113   WBC 9.1 11/08/2023 1041   RBC 4.24 12/25/2023 1113   RBC 4.35 11/08/2023 1041   HGB 12.1 12/25/2023 1113   HCT 38.4 12/25/2023 1113   PLT 285 12/25/2023 1113   MCV 91 12/25/2023 1113   MCH 28.5 12/25/2023 1113   MCH 30.2 09/18/2013 1024   MCHC 31.5 12/25/2023 1113   MCHC 32.6 11/08/2023 1041   RDW 12.3 12/25/2023 1113   Iron Studies No results found for: IRON, TIBC, FERRITIN, IRONPCTSAT Lipid Panel     Component Value Date/Time   CHOL 197 12/25/2023 1113   TRIG 76 12/25/2023 1113   HDL 48 12/25/2023 1113   CHOLHDL 4.1 12/25/2023 1113   CHOLHDL 5 11/08/2023 1041   VLDL 24.8 11/08/2023 1041   LDLCALC 135 (H) 12/25/2023 1113   LDLDIRECT 138.0 10/02/2022 0959   Hepatic Function Panel     Component Value Date/Time   PROT 6.3 12/25/2023 1113   ALBUMIN 4.3 12/25/2023 1113   AST 16 12/25/2023 1113   ALT 11 12/25/2023 1113   ALKPHOS 79 12/25/2023 1113   BILITOT 0.2 12/25/2023 1113   BILIDIR 0.1 09/18/2013 1024   IBILI 0.2 09/18/2013 1024      Component Value Date/Time   TSH 2.710 12/25/2023 1113   Nutritional No results found for: VD25OH  Attestations:   ISonny Laroche, acting as a Stage manager for Phyllis Jenkins, DO., have compiled all relevant documentation for today's office visit on behalf of Phyllis Jenkins, DO, while in the presence of [employer], DO.    I have reviewed the above documentation for accuracy and completeness, and I agree with the above. Phyllis Washington, D.O.  The 21st Century Cures Act was signed into law in 2016 which includes the topic of electronic health records.  This provides immediate access to information in MyChart.  This includes consultation notes, operative notes, office notes, lab results and pathology reports.  If you have any questions about what you read please let us  know at your next visit so we can discuss your concerns and take corrective  action if need be.  We are right here with you.

## 2024-03-20 ENCOUNTER — Ambulatory Visit (INDEPENDENT_AMBULATORY_CARE_PROVIDER_SITE_OTHER): Admitting: Family Medicine

## 2024-03-20 ENCOUNTER — Encounter (INDEPENDENT_AMBULATORY_CARE_PROVIDER_SITE_OTHER): Payer: Self-pay | Admitting: Family Medicine

## 2024-03-20 DIAGNOSIS — Z6831 Body mass index (BMI) 31.0-31.9, adult: Secondary | ICD-10-CM | POA: Diagnosis not present

## 2024-03-20 DIAGNOSIS — F5089 Other specified eating disorder: Secondary | ICD-10-CM

## 2024-03-20 DIAGNOSIS — Z6832 Body mass index (BMI) 32.0-32.9, adult: Secondary | ICD-10-CM

## 2024-03-20 DIAGNOSIS — E88819 Insulin resistance, unspecified: Secondary | ICD-10-CM | POA: Diagnosis not present

## 2024-03-20 MED ORDER — METFORMIN HCL ER 500 MG PO TB24: 1000.0000 mg | ORAL_TABLET | Freq: Every day | ORAL | 0 refills | Status: DC

## 2024-03-20 NOTE — Progress Notes (Signed)
 [name], D.O.  ABFM, ABOM Specializing in Clinical Bariatric Medicine  Office located at: [address] [place], [place]  [address]      A) FOR THE CHRONIC DISEASE OF OBESITY:  Chief complaint: Obesity Phyllis Washington is here to discuss her progress with her obesity treatment plan.   History of present illness / Interval history:  Phyllis Washington is here today for her follow-up office visit.  Since last OV on 03/04/24, pt is down 2 lbs.    03/04/24 14:00 03/20/24 10:00   Body Fat % 40.4 % 41.1 %  Muscle Mass (lbs) 112.6 lbs 110.2 lbs  Fat Mass (lbs) 80.4 lbs 81 lbs  Total Body Water (lbs) 76.8 lbs 77.6 lbs    Counseling done on how various foods will affect these numbers and how to maximize success. - protein intake is important  Total lbs lost to date: - 9 lbs Total Fat Mass in lbs lost to date: -16 lbs Total weight loss percentage to date: - 4.85 %  Pt states that she is unsure if she is doing enough. She is starting to feel discouraged because the weight isn't coming off. She keeps being told by different people different statements on how she should be losing weight. She endorses being around 1200 calories for a week but last week she cut down to 800-900 calories per her husband. She doesn't enjoy the taste of water which is causing an issue with her water intake and feels like she was doing better before her beach trip.      Morbid obesity (HCC) start BMI 33.25  BMI 32.0-32.9,adult current 31.81  Nutrition Therapy She is journaling with 1250-1350 calories/day and 85+ g of protein/day with Cat 2 MP as guide.  and states she is following her eating plan approximately 70 % of the time.   - Tracking Calories/Macros: yes  - Eating More Whole Foods: no   - Adequate Protein Intake: no  - Adequate Water Intake: no   - Skipping Meals: yes  - Sleeping 7-9 Hours/ Night: yes   Marilee is currently in the action stage of change. As such, her goal is to  continue weight management plan.  She has agreed to: journaling with 1250-1350 calories/day and 85+ g of prtoein/day with Cat 2 MP as guide   Physical Activity Pt is doing cardio or strength training 30-60 minutes 2-3 days per week   Iridessa has been advised to work up to 300-450 minutes of moderate intensity aerobic activity a week and strengthening exercises 2-3 times per week for cardiovascular health, weight loss maintenance and preservation of muscle mass.  She has agreed to : Increase physical activity in their day and reduce sedentary time (increase NEAT)., Increase volume of physical activity to a goal of 240 minutes a week, and Combine aerobic and strengthening exercises for efficiency and improved cardiometabolic health.   Behavioral Modifications Evidence-based interventions for health behavior change were utilized today including the discussion of  1) self monitoring techniques:        - block other people out about weight loss journey   2) problem-solving barriers:      -journal         - make good habits   3) self care:  ***  4) SMART goals for next OV:  Journal everyday accurately  Regarding patient's less desirable eating habits and patterns, we employed the technique of small changes.   We discussed the following today: avoiding skipping meals  and increasing water intake , eating enough protein   Additional resources provided today: Handout on Daily Food Journaling Log   Medical Interventions/ Pharmacotherapy Previous Bariatric surgery: n/a Pharmacotherapy for weight loss: She is currently taking Metformin  500 mg twice daily for medical weight loss.    We discussed various medication options to help Teriyah with her weight loss efforts and we both agreed to : Continue with current nutritional and behavioral strategies and weight loss medication (metformin )    B) OBESITY RELATED CONDITIONS ADDRESSED TODAY:  Insulin  resistance Assessment & Plan Lab Results   Component Value Date   HGBA1C 5.3 12/25/2023   HGBA1C 5.4 11/08/2023   HGBA1C 5.5 08/29/2023   INSULIN  8.9 12/25/2023    Metformin  1 tablet twice daily. Good compliance and tolerance. Pt states that she is having cravings when she is not purposeful about eating her protein but denies any excessive hunger. Due to pt still having cravings we have mutually agree to increase dosage to Metformin  XL 2 tablets daily Continue to decrease simple carbs/ sugars; increase fiber and proteins -> follow her meal plan. Explained role of simple carbs and insulin  levels on hunger and cravings. Will refill today.     Other specified eating disorder- emotional eating Assessment & Plan Pt is on obesogenic medications that can make weight loss difficult. She states that she feels like these medications have finally helped stabilize her. She endorses meeting with her psychiatrist and psychologist. Recommended ot to work with them to help optimize her stress management, sleep, and medications. Reminded her that everyone's journey is different and comparison is not what she needs to do.   Medications Discontinued During This Encounter  Medication Reason   metFORMIN  (GLUCOPHAGE ) 500 MG tablet      Meds ordered this encounter  Medications   metFORMIN  (GLUCOPHAGE -XR) 500 MG 24 hr tablet    Sig: Take 2 tablets (1,000 mg total) by mouth daily with lunch.    Dispense:  60 tablet    Refill:  0      Follow up:   Return 04/08/24 at 10:00 AM She was informed of the importance of frequent follow up visits to maximize her success with intensive lifestyle modifications for her multiple health conditions.   Weight Summary and Biometrics   Weight Lost Since Last Visit: 2lb  Weight Gained Since Last Visit: 0lb    Vitals Temp: 98.7 F (37.1 C) BP: 106/71 Pulse Rate: 60 SpO2: 98 %   Anthropometric Measurements Height: 5' 6 (1.676 m) Weight: 197 lb (89.4 kg) BMI (Calculated): 31.81 Weight at Last Visit:  199 Weight Lost Since Last Visit: 2lb Weight Gained Since Last Visit: 0lb Starting Weight: 206lb Total Weight Loss (lbs): 9 lb (4.082 kg) Peak Weight: 230lb Waist Measurement : 46 inches   Body Composition  Body Fat %: 41.1 % Fat Mass (lbs): 81 lbs Muscle Mass (lbs): 110.2 lbs Total Body Water (lbs): 77.6 lbs Visceral Fat Rating : 9   Other Clinical Data Fasting: no Labs: no Today's Visit #: 6 Starting Date: 12/25/23    Objective:   PHYSICAL EXAM: Blood pressure 106/71, pulse 60, temperature 98.7 F (37.1 C), height 5' 6 (1.676 m), weight 197 lb (89.4 kg), last menstrual period 03/12/2024, SpO2 98%. Body mass index is 31.8 kg/m.  General: she is overweight, cooperative and in no acute distress. PSYCH: Has normal mood, affect and thought process.   HEENT: EOMI, sclerae are anicteric. Lungs: Normal breathing effort, no conversational dyspnea. Extremities: Moves * 4 Neurologic:  A and O * 3, good insight  DIAGNOSTIC DATA REVIEWED: BMET    Component Value Date/Time   NA 141 12/25/2023 1113   K 4.7 12/25/2023 1113   CL 106 12/25/2023 1113   CO2 20 12/25/2023 1113   GLUCOSE 98 12/25/2023 1113   GLUCOSE 103 (H) 11/08/2023 1041   BUN 13 12/25/2023 1113   CREATININE 0.84 12/25/2023 1113   CREATININE 0.77 09/18/2013 1024   CALCIUM 9.2 12/25/2023 1113   GFRNONAA >60 04/08/2008 1319   GFRAA  04/08/2008 1319    >60        The eGFR has been calculated using the MDRD equation. This calculation has not been validated in all clinical   Lab Results  Component Value Date   HGBA1C 5.3 12/25/2023   HGBA1C 5.9 12/24/2015   Lab Results  Component Value Date   INSULIN  8.9 12/25/2023   Lab Results  Component Value Date   TSH 2.710 12/25/2023   CBC    Component Value Date/Time   WBC 8.7 12/25/2023 1113   WBC 9.1 11/08/2023 1041   RBC 4.24 12/25/2023 1113   RBC 4.35 11/08/2023 1041   HGB 12.1 12/25/2023 1113   HCT 38.4 12/25/2023 1113   PLT 285 12/25/2023 1113    MCV 91 12/25/2023 1113   MCH 28.5 12/25/2023 1113   MCH 30.2 09/18/2013 1024   MCHC 31.5 12/25/2023 1113   MCHC 32.6 11/08/2023 1041   RDW 12.3 12/25/2023 1113   Iron Studies No results found for: IRON, TIBC, FERRITIN, IRONPCTSAT Lipid Panel     Component Value Date/Time   CHOL 197 12/25/2023 1113   TRIG 76 12/25/2023 1113   HDL 48 12/25/2023 1113   CHOLHDL 4.1 12/25/2023 1113   CHOLHDL 5 11/08/2023 1041   VLDL 24.8 11/08/2023 1041   LDLCALC 135 (H) 12/25/2023 1113   LDLDIRECT 138.0 10/02/2022 0959   Hepatic Function Panel     Component Value Date/Time   PROT 6.3 12/25/2023 1113   ALBUMIN 4.3 12/25/2023 1113   AST 16 12/25/2023 1113   ALT 11 12/25/2023 1113   ALKPHOS 79 12/25/2023 1113   BILITOT 0.2 12/25/2023 1113   BILIDIR 0.1 09/18/2013 1024   IBILI 0.2 09/18/2013 1024      Component Value Date/Time   TSH 2.710 12/25/2023 1113   Nutritional No results found for: VD25OH  Attestations:   ISonny Laroche, acting as a Stage manager for Barnie Jenkins, DO., have compiled all relevant documentation for today's office visit on behalf of Barnie Jenkins, DO, while in the presence of [employer], DO.    I have reviewed the above documentation for accuracy and completeness, and I agree with the above. Barnie JINNY Jenkins, D.O.  The 21st Century Cures Act was signed into law in 2016 which includes the topic of electronic health records.  This provides immediate access to information in MyChart.  This includes consultation notes, operative notes, office notes, lab results and pathology reports.  If you have any questions about what you read please let us  know at your next visit so we can discuss your concerns and take corrective action if need be.  We are right here with you.

## 2024-03-31 ENCOUNTER — Ambulatory Visit: Admitting: Family Medicine

## 2024-04-08 ENCOUNTER — Ambulatory Visit (INDEPENDENT_AMBULATORY_CARE_PROVIDER_SITE_OTHER): Admitting: Family Medicine

## 2024-04-10 ENCOUNTER — Encounter (INDEPENDENT_AMBULATORY_CARE_PROVIDER_SITE_OTHER): Payer: Self-pay

## 2024-04-10 ENCOUNTER — Encounter (INDEPENDENT_AMBULATORY_CARE_PROVIDER_SITE_OTHER): Payer: Self-pay | Admitting: Family Medicine

## 2024-04-10 ENCOUNTER — Ambulatory Visit (INDEPENDENT_AMBULATORY_CARE_PROVIDER_SITE_OTHER): Admitting: Family Medicine

## 2024-04-10 DIAGNOSIS — Z6831 Body mass index (BMI) 31.0-31.9, adult: Secondary | ICD-10-CM | POA: Diagnosis not present

## 2024-04-10 DIAGNOSIS — Z6832 Body mass index (BMI) 32.0-32.9, adult: Secondary | ICD-10-CM

## 2024-04-10 DIAGNOSIS — E88819 Insulin resistance, unspecified: Secondary | ICD-10-CM | POA: Diagnosis not present

## 2024-04-10 MED ORDER — METFORMIN HCL ER 500 MG PO TB24: ORAL_TABLET | ORAL | 0 refills | Status: DC

## 2024-04-10 NOTE — Progress Notes (Signed)
 Phyllis Washington, D.O.  ABFM, ABOM Specializing in Clinical Bariatric Medicine  Office located at: [address] [place], [place]  [address]    Obtain fasting labs next OV.  A) FOR THE CHRONIC DISEASE OF OBESITY:  Chief complaint: Obesity Phyllis Washington is here to discuss her progress with her obesity treatment plan.   History of present illness / Interval history:  Phyllis Washington is here today for her follow-up office visit.  Since last OV on 03/20/24, pt is down 2 lbs. Pt states that she has been doing better with her protein and has been working out a lot. She has gotten up to 32 minutes on the stair master. She endorses feeling better and can tell with the way her clothes fits. She states that she has been taking creatine for the last 2 weeks.     03/20/24 10:00 04/10/24 11:00   Body Fat % 41.1 % 40.7 %  Muscle Mass (lbs) 110.2 lbs 110 lbs  Fat Mass (lbs) 81 lbs 79.6 lbs  Total Body Water (lbs) 77.6 lbs 76.2 lbs  Visceral Fat Rating  9 9    Counseling done on how various foods will affect these numbers and how to maximize success   Total lbs lost to date: -11 lbs Total Fat Mass in lbs lost to date: - 17.4 Total weight loss percentage to date: - 5.34 %    Morbid obesity (HCC) start BMI 33.25 BMI 32.0-32.9,adult current 31.49  Nutrition Therapy She is journaling with 1250-1350 calories/day and 85+ g of protein/day with Cat 2 MP as guide and states she is following her eating plan approximately 75 % of the time.   - Tracking Calories/Macros: yes  - Eating More Whole Foods: yes  - Adequate Protein Intake: yes  - Adequate Water Intake: yes  - Skipping Meals: yes  - Sleeping 7-9 Hours/ Night: yes   Shanecia is currently in the action stage of change. As such, her goal is to continue weight management plan.  She has agreed to: continue current plan   Physical Activity Amoy is going to the gym for 90+  minutes 5 days per week   Dionicia has been advised to  work up to 300-450 minutes of moderate intensity aerobic activity a week and strengthening exercises 2-3 times per week for cardiovascular health, weight loss maintenance and preservation of muscle mass.  She has agreed to : Continue current level of physical activity  and Increase and monitor steps for a goal of 10,000 per day   Behavioral Modifications Evidence-based interventions for health behavior change were utilized today including the discussion of  1) self monitoring techniques:    2) problem-solving barriers:    3) self care:    4) SMART goals for next OV:    - Work to get 12000-15000 steps everyday  Regarding patient's less desirable eating habits and patterns, we employed the technique of small changes.   We discussed the following today: increasing lean protein intake to established goals and continue to work on maintaining a reduced calorie state, getting the recommended amount of protein, incorporating whole foods, making healthy choices, staying well hydrated and practicing mindfulness when eating. Additional resources provided today: Handout on Daily Food Journaling Log   Medical Interventions/ Pharmacotherapy Previous Bariatric surgery: n/a Pharmacotherapy for weight loss: She is currently taking Metformin  XR 2 tablets daily for medical weight loss.    We discussed various medication options to help Phyllis Washington with her weight loss efforts and  we both agreed to : Continue with current nutritional and behavioral strategies and Adequate clinical response to anti-obesity medication, continue current regimen   B) OBESITY RELATED CONDITIONS ADDRESSED TODAY:  Insulin  resistance Assessment & Plan Lab Results  Component Value Date   HGBA1C 5.3 12/25/2023   HGBA1C 5.4 11/08/2023   HGBA1C 5.5 08/29/2023   INSULIN  8.9 12/25/2023    On Metformin  XR 2 tablets daily. Good compliance and tolerance. Pt denies any side effects. She denies excessive hunger but endorses still having  some cravings. Explained to pt that simple carbs causes an increase of hunger and to focus on lean proteins and avoiding simple carbs. Mutually agreed to Increase Metformin  XR to 2 tablets at lunch and 1 tablet at dinner. Continue following prudent meal plan and decreasing simple carbs and sugars.     Medications Discontinued During This Encounter  Medication Reason   metFORMIN  (GLUCOPHAGE -XR) 500 MG 24 hr tablet      Meds ordered this encounter  Medications   metFORMIN  (GLUCOPHAGE -XR) 500 MG 24 hr tablet    Sig: 2 tabs at lunch and 1 at dinner    Dispense:  90 tablet    Refill:  0      Follow up:   Return 04/24/2024 at 10:40 AM  She was informed of the importance of frequent follow up visits to maximize her success with intensive lifestyle modifications for her multiple health conditions.   Weight Summary and Biometrics   Weight Lost Since Last Visit: 2lb  Weight Gained Since Last Visit: 0lb    Vitals Temp: 98.2 F (36.8 C) BP: 105/77 Pulse Rate: 64 SpO2: 99 %   Anthropometric Measurements Height: 5' 6 (1.676 m) Weight: 195 lb (88.5 kg) BMI (Calculated): 31.49 Weight at Last Visit: 197lb Weight Lost Since Last Visit: 2lb Weight Gained Since Last Visit: 0lb Starting Weight: 206lb Total Weight Loss (lbs): 11 lb (4.99 kg) Peak Weight: 230lb   Body Composition  Body Fat %: 40.7 % Fat Mass (lbs): 79.6 lbs Muscle Mass (lbs): 110 lbs Total Body Water (lbs): 76.2 lbs Visceral Fat Rating : 9   Other Clinical Data Fasting: yes Labs: yes Today's Visit #: 7 Starting Date: 12/25/23    Objective:   PHYSICAL EXAM: Blood pressure 105/77, pulse 64, temperature 98.2 F (36.8 C), height 5' 6 (1.676 m), weight 195 lb (88.5 kg), last menstrual period 03/12/2024, SpO2 99%. Body mass index is 31.47 kg/m.  General: she is overweight, cooperative and in no acute distress. PSYCH: Has normal mood, affect and thought process.   HEENT: EOMI, sclerae are  anicteric. Lungs: Normal breathing effort, no conversational dyspnea. Extremities: Moves * 4 Neurologic: A and O * 3, good insight  DIAGNOSTIC DATA REVIEWED: BMET    Component Value Date/Time   NA 141 12/25/2023 1113   K 4.7 12/25/2023 1113   CL 106 12/25/2023 1113   CO2 20 12/25/2023 1113   GLUCOSE 98 12/25/2023 1113   GLUCOSE 103 (H) 11/08/2023 1041   BUN 13 12/25/2023 1113   CREATININE 0.84 12/25/2023 1113   CREATININE 0.77 09/18/2013 1024   CALCIUM 9.2 12/25/2023 1113   GFRNONAA >60 04/08/2008 1319   GFRAA  04/08/2008 1319    >60        The eGFR has been calculated using the MDRD equation. This calculation has not been validated in all clinical   Lab Results  Component Value Date   HGBA1C 5.3 12/25/2023   HGBA1C 5.9 12/24/2015   Lab Results  Component  Value Date   INSULIN  8.9 12/25/2023   Lab Results  Component Value Date   TSH 2.710 12/25/2023   CBC    Component Value Date/Time   WBC 8.7 12/25/2023 1113   WBC 9.1 11/08/2023 1041   RBC 4.24 12/25/2023 1113   RBC 4.35 11/08/2023 1041   HGB 12.1 12/25/2023 1113   HCT 38.4 12/25/2023 1113   PLT 285 12/25/2023 1113   MCV 91 12/25/2023 1113   MCH 28.5 12/25/2023 1113   MCH 30.2 09/18/2013 1024   MCHC 31.5 12/25/2023 1113   MCHC 32.6 11/08/2023 1041   RDW 12.3 12/25/2023 1113   Iron Studies No results found for: IRON, TIBC, FERRITIN, IRONPCTSAT Lipid Panel     Component Value Date/Time   CHOL 197 12/25/2023 1113   TRIG 76 12/25/2023 1113   HDL 48 12/25/2023 1113   CHOLHDL 4.1 12/25/2023 1113   CHOLHDL 5 11/08/2023 1041   VLDL 24.8 11/08/2023 1041   LDLCALC 135 (H) 12/25/2023 1113   LDLDIRECT 138.0 10/02/2022 0959   Hepatic Function Panel     Component Value Date/Time   PROT 6.3 12/25/2023 1113   ALBUMIN 4.3 12/25/2023 1113   AST 16 12/25/2023 1113   ALT 11 12/25/2023 1113   ALKPHOS 79 12/25/2023 1113   BILITOT 0.2 12/25/2023 1113   BILIDIR 0.1 09/18/2013 1024   IBILI 0.2  09/18/2013 1024      Component Value Date/Time   TSH 2.710 12/25/2023 1113   Nutritional No results found for: VD25OH  Attestations:   ISonny Laroche, acting as a Stage manager for Phyllis Jenkins, DO., have compiled all relevant documentation for today's office visit on behalf of Phyllis Jenkins, DO, while in the presence of [employer], DO.   I have reviewed the above documentation for accuracy and completeness, and I agree with the above. Phyllis JINNY Washington, D.O.  The 21st Century Cures Act was signed into law in 2016 which includes the topic of electronic health records.  This provides immediate access to information in MyChart.  This includes consultation notes, operative notes, office notes, lab results and pathology reports.  If you have any questions about what you read please let us  know at your next visit so we can discuss your concerns and take corrective action if need be.  We are right here with you.

## 2024-04-24 ENCOUNTER — Ambulatory Visit (INDEPENDENT_AMBULATORY_CARE_PROVIDER_SITE_OTHER): Admitting: Family Medicine

## 2024-04-24 ENCOUNTER — Encounter (INDEPENDENT_AMBULATORY_CARE_PROVIDER_SITE_OTHER): Payer: Self-pay | Admitting: Family Medicine

## 2024-04-24 DIAGNOSIS — Z6832 Body mass index (BMI) 32.0-32.9, adult: Secondary | ICD-10-CM

## 2024-04-24 DIAGNOSIS — Z6831 Body mass index (BMI) 31.0-31.9, adult: Secondary | ICD-10-CM

## 2024-04-24 DIAGNOSIS — E782 Mixed hyperlipidemia: Secondary | ICD-10-CM

## 2024-04-24 DIAGNOSIS — E88819 Insulin resistance, unspecified: Secondary | ICD-10-CM

## 2024-04-24 DIAGNOSIS — Z79899 Other long term (current) drug therapy: Secondary | ICD-10-CM

## 2024-04-24 NOTE — Progress Notes (Signed)
 [name], D.O.  ABFM, ABOM Specializing in Clinical Bariatric Medicine  Office located at: [address] [place], [place]  [address]    Obtain labs today and review at next OV.   A) FOR THE CHRONIC DISEASE OF OBESITY:  Chief complaint: Obesity Phyllis Washington is here to discuss her progress with her obesity treatment plan.   History of present illness / Interval history:  Phyllis Washington is here today for her follow-up office visit.  Since last OV on 04/10/24, pt is down 1 lb. Pt states that she is feeling a little disappointed on how much weight she has lost because she has been working her butt off. She is averaging about 6000 steps a day. She endorses being able to tell the difference in the way her clothes is fitting her.     04/10/24 11:00 04/24/24 09:00   Body Fat % 40.7 % 40 %  Muscle Mass (lbs) 110 lbs 110.8 lbs  Fat Mass (lbs) 79.6 lbs 77.8 lbs  Total Body Water (lbs) 76.2 lbs 77 lbs  Visceral Fat Rating  9 9   Counseling done on how various foods will affect these numbers and how to maximize success   Total lbs lost to date: -12  lbs Total Fat Mass in lbs lost to date: - 19.2 lbs Total weight loss percentage to date: - 5.83 %    Morbid obesity (HCC) start BMI 33.25 BMI 32.0-32.9,adult current 31.33 Nutrition Therapy She is journaling with 1250-1350 calories/day and 85+ g of protein/day and states she is following her eating plan approximately 75 % of the time.   - Tracking Calories/Macros: yes  - Eating More Whole Foods: yes  - Adequate Protein Intake: yes  - Adequate Water Intake: yes  - Skipping Meals: yes  - Sleeping 7-9 Hours/ Night: yes   Phyllis Washington is currently in the action stage of change. As such, her goal is to continue weight management plan.  She has agreed to: continue current plan   Physical Activity Phyllis Washington is strength training and doing cardio 30 to 60  minutes 3-5 days per week   Phyllis Washington has been advised to work up to  300-450 minutes of moderate intensity aerobic activity a week and strengthening exercises 2-3 times per week for cardiovascular health, weight loss maintenance and preservation of muscle mass.  She has agreed to : Think about enjoyable ways to increase daily physical activity and overcoming barriers to exercise and Increase and monitor steps for a goal of 10,000 per day   Behavioral Modifications Evidence-based interventions for health behavior change were utilized today including the discussion of  1) self monitoring techniques:    - Continue journaling   2) SMART goals for next OV:    - 7000+ steps a day   - 90+ oz of water per day  Regarding patient's less desirable eating habits and patterns, we employed the technique of small changes.   We discussed the following today: increasing lean protein intake to established goals, continue to work on implementation of reduced calorie nutritional plan, and planning for success, discussion on ideal heart rate goal during exercise   Additional resources provided today: Handout on Daily Food Journaling Log   Medical Interventions/ Pharmacotherapy Previous Bariatric surgery: n/a Pharmacotherapy for weight loss: She is currently taking Metformin  XR 2 tablets at lunch and 1 at dinner for medical weight loss.    We discussed various medication options to help Phyllis Washington with her weight loss efforts and we  both agreed to : Adequate clinical response to anti-obesity medication, continue current regimen   B) OBESITY RELATED CONDITIONS ADDRESSED TODAY:  Insulin  resistance Assessment & Plan Lab Results  Component Value Date   HGBA1C 5.3 12/25/2023   HGBA1C 5.4 11/08/2023   HGBA1C 5.5 08/29/2023   INSULIN  8.9 12/25/2023    Metformin  XR 2 tablets at lunch and 1 tablet at dinner. Pt states that her hunger and cravings have decreased since she has started concentrating more on her protein. Will obtain labs today and review at next OV. Continue with  medications and focusing on lean protein intake.     High risk medication use Assessment & Plan On Seroquel 100 mg daily, Risperdal 1 mg daily, Zoloft 100 mg daily, Good compliance and tolerance. Due to pts psych medications it can be quite difficult for her to lose weight. She is doing well with her journey and will continue on focusing on her nutritional meal plan.     Hyperlipidemia, mixed Assessment & Plan Lab Results  Component Value Date   CHOL 197 12/25/2023   HDL 48 12/25/2023   LDLCALC 135 (H) 12/25/2023   LDLDIRECT 138.0 10/02/2022   TRIG 76 12/25/2023   CHOLHDL 4.1 12/25/2023    Pts is not on any medications at this moment. Life style and diet controlled. Previous labs show an elevated LDL. Will obtain labs today and review at next OV.       Follow up:   Return 05/06/2024 at 12:20 PM  She was informed of the importance of frequent follow up visits to maximize her success with intensive lifestyle modifications for her multiple health conditions.   Weight Summary and Biometrics   Weight Lost Since Last Visit: 1lb  Weight Gained Since Last Visit: 0lb    Vitals Temp: 98 F (36.7 C) BP: 109/75 Pulse Rate: 84 SpO2: 99 %   Anthropometric Measurements Height: 5' 6 (1.676 m) Weight: 194 lb (88 kg) BMI (Calculated): 31.33 Weight at Last Visit: 195lb Weight Lost Since Last Visit: 1lb Weight Gained Since Last Visit: 0lb Starting Weight: 206lb Total Weight Loss (lbs): 12 lb (5.443 kg) Peak Weight: 230lb   Body Composition  Body Fat %: 40 % Fat Mass (lbs): 77.8 lbs Muscle Mass (lbs): 110.8 lbs Total Body Water (lbs): 77 lbs Visceral Fat Rating : 9   Other Clinical Data Fasting: yes Labs: yes Today's Visit #: 8 Starting Date: 12/25/23    Objective:   PHYSICAL EXAM: Blood pressure 109/75, pulse 84, temperature 98 F (36.7 C), height 5' 6 (1.676 m), weight 194 lb (88 kg), last menstrual period 04/13/2024, SpO2 99%. Body mass index is 31.31  kg/m.  General: she is overweight, cooperative and in no acute distress. PSYCH: Has normal mood, affect and thought process.   HEENT: EOMI, sclerae are anicteric. Lungs: Normal breathing effort, no conversational dyspnea. Extremities: Moves * 4 Neurologic: A and O * 3, good insight  DIAGNOSTIC DATA REVIEWED: BMET    Component Value Date/Time   NA 141 12/25/2023 1113   K 4.7 12/25/2023 1113   CL 106 12/25/2023 1113   CO2 20 12/25/2023 1113   GLUCOSE 98 12/25/2023 1113   GLUCOSE 103 (H) 11/08/2023 1041   BUN 13 12/25/2023 1113   CREATININE 0.84 12/25/2023 1113   CREATININE 0.77 09/18/2013 1024   CALCIUM 9.2 12/25/2023 1113   GFRNONAA >60 04/08/2008 1319   GFRAA  04/08/2008 1319    >60        The eGFR has been  calculated using the MDRD equation. This calculation has not been validated in all clinical   Lab Results  Component Value Date   HGBA1C 5.3 12/25/2023   HGBA1C 5.9 12/24/2015   Lab Results  Component Value Date   INSULIN  8.9 12/25/2023   Lab Results  Component Value Date   TSH 2.710 12/25/2023   CBC    Component Value Date/Time   WBC 8.7 12/25/2023 1113   WBC 9.1 11/08/2023 1041   RBC 4.24 12/25/2023 1113   RBC 4.35 11/08/2023 1041   HGB 12.1 12/25/2023 1113   HCT 38.4 12/25/2023 1113   PLT 285 12/25/2023 1113   MCV 91 12/25/2023 1113   MCH 28.5 12/25/2023 1113   MCH 30.2 09/18/2013 1024   MCHC 31.5 12/25/2023 1113   MCHC 32.6 11/08/2023 1041   RDW 12.3 12/25/2023 1113   Iron Studies No results found for: IRON, TIBC, FERRITIN, IRONPCTSAT Lipid Panel     Component Value Date/Time   CHOL 197 12/25/2023 1113   TRIG 76 12/25/2023 1113   HDL 48 12/25/2023 1113   CHOLHDL 4.1 12/25/2023 1113   CHOLHDL 5 11/08/2023 1041   VLDL 24.8 11/08/2023 1041   LDLCALC 135 (H) 12/25/2023 1113   LDLDIRECT 138.0 10/02/2022 0959   Hepatic Function Panel     Component Value Date/Time   PROT 6.3 12/25/2023 1113   ALBUMIN 4.3 12/25/2023 1113   AST 16  12/25/2023 1113   ALT 11 12/25/2023 1113   ALKPHOS 79 12/25/2023 1113   BILITOT 0.2 12/25/2023 1113   BILIDIR 0.1 09/18/2013 1024   IBILI 0.2 09/18/2013 1024      Component Value Date/Time   TSH 2.710 12/25/2023 1113   Nutritional No results found for: VD25OH  Attestations:   ISonny Laroche, acting as a stage manager for Barnie Jenkins, DO., have compiled all relevant documentation for today's office visit on behalf of Barnie Jenkins, DO, while in the presence of [employer], DO.   I have reviewed the above documentation for accuracy and completeness, and I agree with the above. Barnie JINNY Jenkins, D.O.  The 21st Century Cures Act was signed into law in 2016 which includes the topic of electronic health records.  This provides immediate access to information in MyChart.  This includes consultation notes, operative notes, office notes, lab results and pathology reports.  If you have any questions about what you read please let us  know at your next visit so we can discuss your concerns and take corrective action if need be.  We are right here with you.

## 2024-04-25 LAB — BASIC METABOLIC PANEL WITH GFR
BUN/Creatinine Ratio: 18 (ref 9–23)
BUN: 16 mg/dL (ref 6–24)
CO2: 19 mmol/L — ABNORMAL LOW (ref 20–29)
Calcium: 9.7 mg/dL (ref 8.7–10.2)
Chloride: 102 mmol/L (ref 96–106)
Creatinine, Ser: 0.88 mg/dL (ref 0.57–1.00)
Glucose: 94 mg/dL (ref 70–99)
Potassium: 4.4 mmol/L (ref 3.5–5.2)
Sodium: 136 mmol/L (ref 134–144)
eGFR: 83 mL/min/1.73 (ref >59)

## 2024-04-25 LAB — TSH: TSH: 4.82 u[IU]/mL — ABNORMAL HIGH (ref 0.450–4.500)

## 2024-04-25 LAB — LIPID PANEL
Chol/HDL Ratio: 3.9 ratio (ref 0.0–4.4)
Cholesterol, Total: 205 mg/dL — ABNORMAL HIGH (ref 100–199)
HDL: 53 mg/dL (ref >39)
LDL Chol Calc (NIH): 135 mg/dL — ABNORMAL HIGH (ref 0–99)
Triglycerides: 95 mg/dL (ref 0–149)
VLDL Cholesterol Cal: 17 mg/dL (ref 5–40)

## 2024-04-25 LAB — MAGNESIUM: Magnesium: 1.9 mg/dL (ref 1.6–2.3)

## 2024-04-25 LAB — T4, FREE: Free T4: 0.96 ng/dL (ref 0.82–1.77)

## 2024-04-25 LAB — INSULIN, RANDOM: INSULIN: 7.6 u[IU]/mL (ref 2.6–24.9)

## 2024-04-25 LAB — VITAMIN B12: Vitamin B-12: 704 pg/mL (ref 232–1245)

## 2024-04-28 ENCOUNTER — Encounter: Payer: Self-pay | Admitting: Radiology

## 2024-05-05 ENCOUNTER — Other Ambulatory Visit (INDEPENDENT_AMBULATORY_CARE_PROVIDER_SITE_OTHER): Payer: Self-pay | Admitting: Family Medicine

## 2024-05-05 DIAGNOSIS — E88819 Insulin resistance, unspecified: Secondary | ICD-10-CM

## 2024-05-06 ENCOUNTER — Ambulatory Visit (INDEPENDENT_AMBULATORY_CARE_PROVIDER_SITE_OTHER): Payer: Self-pay | Admitting: Family Medicine

## 2024-05-06 ENCOUNTER — Encounter (INDEPENDENT_AMBULATORY_CARE_PROVIDER_SITE_OTHER): Payer: Self-pay | Admitting: Family Medicine

## 2024-05-06 DIAGNOSIS — Z6832 Body mass index (BMI) 32.0-32.9, adult: Secondary | ICD-10-CM

## 2024-05-06 DIAGNOSIS — E038 Other specified hypothyroidism: Secondary | ICD-10-CM

## 2024-05-06 DIAGNOSIS — E88819 Insulin resistance, unspecified: Secondary | ICD-10-CM

## 2024-05-06 DIAGNOSIS — K5909 Other constipation: Secondary | ICD-10-CM

## 2024-05-06 DIAGNOSIS — E782 Mixed hyperlipidemia: Secondary | ICD-10-CM

## 2024-05-06 DIAGNOSIS — Z6831 Body mass index (BMI) 31.0-31.9, adult: Secondary | ICD-10-CM

## 2024-05-06 MED ORDER — METFORMIN HCL ER 500 MG PO TB24: ORAL_TABLET | ORAL | 0 refills | Status: DC

## 2024-05-06 NOTE — Progress Notes (Signed)
 [name], D.O.  ABFM, ABOM Specializing in Clinical Bariatric Medicine  Office located at: [address] [place], [place]  [address]      A) FOR THE CHRONIC DISEASE OF OBESITY:  Chief complaint: Obesity Phyllis Washington is here to discuss her progress with her obesity treatment plan.   History of present illness / Interval history:  Phyllis Washington is here today for her follow-up office visit.  Since last OV on 04/24/24, pt is the same weight. Patient states that Phyllis Washington is very constipated and does not feel good. Phyllis Washington averages about 95 oz  of water per day with 90-95 g of protein and 965 to 1336 calories per day. Phyllis Washington endorses feeling stressed recently.     04/24/24 09:00 05/06/24 12:00   Body Fat % 40 % 40.1 %  Muscle Mass (lbs) 110.8 lbs 110.8 lbs  Fat Mass (lbs) 77.8 lbs 78 lbs  Total Body Water (lbs) 77 lbs 77.4 lbs  Visceral Fat Rating  9 9    Counseling done on how various foods will affect these numbers and how to maximize success   Total lbs lost to date: - 12 lbs Total Fat Mass in lbs lost to date: - 19 lbs  Total weight loss percentage to date: - 5.83 %    Morbid obesity (HCC) start BMI 33.25 BMI 32.0-32.9,adult current 31.33 Nutrition Therapy Phyllis Washington is  journaling with 1250-1350 calories/day and 85+ g of protein/day and states Phyllis Washington is following her eating plan approximately 75 % of the time.   - Tracking Calories/Macros: yes  - Eating More Whole Foods: yes  - Adequate Protein Intake: yes  - Adequate Water Intake: yes  - Skipping Meals: yes  - Sleeping 7-9 Hours/ Night: yes   Phyllis Washington is currently in the action stage of change. As such, her goal is to continue weight management plan.  Phyllis Washington has agreed to: continue current plan   Physical Activity Phyllis Washington is strength training,weight or cardio 20 to 60  minutes 3 to 5 days per week   Phyllis Washington has been advised to work up to 300-450 minutes of moderate intensity aerobic activity a week and strengthening  exercises 2-3 times per week for cardiovascular health, weight loss maintenance and preservation of muscle mass.  Phyllis Washington has agreed to : Continue current level of physical activity    Behavioral Modifications Evidence-based interventions for health behavior change were utilized today including the discussion of   1) self monitoring techniques:    - Continue journaling   2) problem-solving barriers:    - Avoid fried foods  3) SMART goals for next OV:    - Increase water intake (1 extra bottle for every 30 min of exercise)   Regarding patient's less desirable eating habits and patterns, we employed the technique of small changes.   We discussed the following today: increasing water intake  and continue to work on implementation of reduced calorie nutritional plan Additional resources provided today: None   Medical Interventions/ Pharmacotherapy Previous Bariatric surgery: n/a Pharmacotherapy for weight loss: Phyllis Washington is currently taking Metformin  XR 2 tablets at lunch and 1 at dinner for medical weight loss.    We discussed various medication options to help Phyllis Washington with her weight loss efforts and we both agreed to : Adequate clinical response to anti-obesity medication, continue current regimen   B) OBESITY RELATED CONDITIONS ADDRESSED TODAY:  Other constipation- acute on chronic Insulin  resistance Assessment & Plan Lab Results  Component Value Date   HGBA1C  5.3 12/25/2023   HGBA1C 5.4 11/08/2023   HGBA1C 5.5 08/29/2023   INSULIN  7.6 04/24/2024   INSULIN  8.9 12/25/2023    Lab Results  Component Value Date   VITAMINB12 704 04/24/2024      Component Value Date/Time   NA 136 04/24/2024 1039   K 4.4 04/24/2024 1039   CL 102 04/24/2024 1039   CO2 19 (L) 04/24/2024 1039   GLUCOSE 94 04/24/2024 1039   GLUCOSE 103 (H) 11/08/2023 1041   BUN 16 04/24/2024 1039   CREATININE 0.88 04/24/2024 1039   CREATININE 0.77 09/18/2013 1024   CALCIUM 9.7 04/24/2024 1039   PROT 6.3  12/25/2023 1113   ALBUMIN 4.3 12/25/2023 1113   AST 16 12/25/2023 1113   ALT 11 12/25/2023 1113   ALKPHOS 79 12/25/2023 1113   BILITOT 0.2 12/25/2023 1113   GFR 82.91 11/08/2023 1041   EGFR 83 04/24/2024 1039   GFRNONAA >60 04/08/2008 1319   On Metformin  XR 2 tablets at lunch and 1 tablet at dinner. With reported good compliance and tolerance. Patient states that Phyllis Washington doesn't feel munchy or excessively hungry. Her insulin  levels have decreased from 8.9 to 7.6. Insulin  levels are still not at goal but are steadily decreasing. Phyllis Washington states that Phyllis Washington has had some increase in her constipation and is drinking apple juice and eating apple sauce. Phyllis Washington endorses taking 1 cap of Miralax daily already. Explained to patient that Phyllis Washington can safely increase her Miralax dosage to twice daily and take a stool softener to help. Phyllis Washington can also take magnesium citrate to immediately help with constipation. Continue with medication (will refill today) and following prudent meal plan and decreasing simple carbs and sugars.   Phyllis Washington kidneys show that her BUN levels are a little elevated which can mean that Phyllis Washington is not well hydrates. Recommended that patient increase her water intake.   B12 levels are at goal. Patient states that Phyllis Washington takes a MVM that has B12 in it. Continue taking MVM. Will reobtain labs in 3-4 months.     Hyperlipidemia, mixed Assessment & Plan Lab Results  Component Value Date   CHOL 205 (H) 04/24/2024   HDL 53 04/24/2024   LDLCALC 135 (H) 04/24/2024   LDLDIRECT 138.0 10/02/2022   TRIG 95 04/24/2024   CHOLHDL 3.9 04/24/2024   The 10-year ASCVD risk score (Arnett DK, et al., 2019) is: 1.9%   Values used to calculate the score:     Age: 43 years     Clincally relevant sex: Female     Is Non-Hispanic African American: No     Diabetic: No     Tobacco smoker: Yes     Systolic Blood Pressure: 101 mmHg     Is BP treated: No     HDL Cholesterol: 53 mg/dL     Total Cholesterol: 205 mg/dL  Patient  is not currently on any medications. Diet and life style controlled. Phyllis Washington HDL levels have increased from 48 to 53. LDL levels are still elevated explained to patient that his could be due to genetics and how fried and fatty foods can cause LDL levels to increase. ASCVD score was reviewed and explained to the patient. No acute concerns today. Continue following prudent meal plan and decreasing saturated and trans fat.      Subclinical hypothyroidism Assessment & Plan Lab Results  Component Value Date   TSH 4.820 (H) 04/24/2024   FREET4 0.96 04/24/2024   Phyllis Washington Free T4 levels are at goal, no acute concerns today. TSH  levels are slightly elevated but a lot better from previous labs. Will continue monitoring.   Medications Discontinued During This Encounter  Medication Reason   metFORMIN  (GLUCOPHAGE -XR) 500 MG 24 hr tablet Reorder     Meds ordered this encounter  Medications   metFORMIN  (GLUCOPHAGE -XR) 500 MG 24 hr tablet    Sig: 2 tabs at lunch and 1 at dinner    Dispense:  90 tablet    Refill:  0     Follow up:   Return 05/29/2024 at 11:00 AM  Phyllis Washington was informed of the importance of frequent follow up visits to maximize her success with intensive lifestyle modifications for her multiple health conditions.   Weight Summary and Biometrics   Weight Lost Since Last Visit: 0lb  Weight Gained Since Last Visit: 0lb   Vitals Temp: 98 F (36.7 C) BP: 101/70 Pulse Rate: 72 SpO2: 98 %   Anthropometric Measurements Height: 5' 6 (1.676 m) Weight: 194 lb (88 kg) BMI (Calculated): 31.33 Weight at Last Visit: 194lb Weight Lost Since Last Visit: 0lb Weight Gained Since Last Visit: 0lb Starting Weight: 206lb Total Weight Loss (lbs): 12 lb (5.443 kg) Peak Weight: 230lb   Body Composition  Body Fat %: 40.1 % Fat Mass (lbs): 78 lbs Muscle Mass (lbs): 110.8 lbs Total Body Water (lbs): 77.4 lbs Visceral Fat Rating : 9   Other Clinical Data Fasting: yes Labs: no Today's  Visit #: 9+ Starting Date: 12/25/23    Objective:   PHYSICAL EXAM: Blood pressure 101/70, pulse 72, temperature 98 F (36.7 C), height 5' 6 (1.676 m), weight 194 lb (88 kg), last menstrual period 04/13/2024, SpO2 98%. Body mass index is 31.31 kg/m.  General: Phyllis Washington is overweight, cooperative and in no acute distress. PSYCH: Has normal mood, affect and thought process.   HEENT: EOMI, sclerae are anicteric. Lungs: Normal breathing effort, no conversational dyspnea. Extremities: Moves * 4 Neurologic: A and O * 3, good insight  DIAGNOSTIC DATA REVIEWED: BMET    Component Value Date/Time   NA 136 04/24/2024 1039   K 4.4 04/24/2024 1039   CL 102 04/24/2024 1039   CO2 19 (L) 04/24/2024 1039   GLUCOSE 94 04/24/2024 1039   GLUCOSE 103 (H) 11/08/2023 1041   BUN 16 04/24/2024 1039   CREATININE 0.88 04/24/2024 1039   CREATININE 0.77 09/18/2013 1024   CALCIUM 9.7 04/24/2024 1039   GFRNONAA >60 04/08/2008 1319   GFRAA  04/08/2008 1319    >60        The eGFR has been calculated using the MDRD equation. This calculation has not been validated in all clinical   Lab Results  Component Value Date   HGBA1C 5.3 12/25/2023   HGBA1C 5.9 12/24/2015   Lab Results  Component Value Date   INSULIN  7.6 04/24/2024   INSULIN  8.9 12/25/2023   Lab Results  Component Value Date   TSH 4.820 (H) 04/24/2024   CBC    Component Value Date/Time   WBC 8.7 12/25/2023 1113   WBC 9.1 11/08/2023 1041   RBC 4.24 12/25/2023 1113   RBC 4.35 11/08/2023 1041   HGB 12.1 12/25/2023 1113   HCT 38.4 12/25/2023 1113   PLT 285 12/25/2023 1113   MCV 91 12/25/2023 1113   MCH 28.5 12/25/2023 1113   MCH 30.2 09/18/2013 1024   MCHC 31.5 12/25/2023 1113   MCHC 32.6 11/08/2023 1041   RDW 12.3 12/25/2023 1113   Iron Studies No results found for: IRON, TIBC, FERRITIN, IRONPCTSAT Lipid Panel  Component Value Date/Time   CHOL 205 (H) 04/24/2024 1039   TRIG 95 04/24/2024 1039   HDL 53 04/24/2024  1039   CHOLHDL 3.9 04/24/2024 1039   CHOLHDL 5 11/08/2023 1041   VLDL 24.8 11/08/2023 1041   LDLCALC 135 (H) 04/24/2024 1039   LDLDIRECT 138.0 10/02/2022 0959   Hepatic Function Panel     Component Value Date/Time   PROT 6.3 12/25/2023 1113   ALBUMIN 4.3 12/25/2023 1113   AST 16 12/25/2023 1113   ALT 11 12/25/2023 1113   ALKPHOS 79 12/25/2023 1113   BILITOT 0.2 12/25/2023 1113   BILIDIR 0.1 09/18/2013 1024   IBILI 0.2 09/18/2013 1024      Component Value Date/Time   TSH 4.820 (H) 04/24/2024 1039   Nutritional No results found for: VD25OH  Attestations:   ISonny Laroche, acting as a medical scribe for Barnie Jenkins, DO., have compiled all relevant documentation for today's office visit on behalf of Barnie Jenkins, DO, while in the presence of [employer], DO.   I have reviewed the above documentation for accuracy and completeness, and I agree with the above. Barnie JINNY Jenkins, D.O.  The 21st Century Cures Act was signed into law in 2016 which includes the topic of electronic health records.  This provides immediate access to information in MyChart.  This includes consultation notes, operative notes, office notes, lab results and pathology reports.  If you have any questions about what you read please let us  know at your next visit so we can discuss your concerns and take corrective action if need be.  We are right here with you.

## 2024-05-06 NOTE — Patient Instructions (Signed)
 The 10-year ASCVD risk score (Arnett DK, et al., 2019) is: 1.9%   Values used to calculate the score:     Age: 43 years     Clincally relevant sex: Female     Is Non-Hispanic African American: No     Diabetic: No     Tobacco smoker: Yes     Systolic Blood Pressure: 101 mmHg     Is BP treated: No     HDL Cholesterol: 53 mg/dL     Total Cholesterol: 205 mg/dL

## 2024-05-08 ENCOUNTER — Ambulatory Visit (INDEPENDENT_AMBULATORY_CARE_PROVIDER_SITE_OTHER): Admitting: Family Medicine

## 2024-05-18 NOTE — Assessment & Plan Note (Signed)
 Encouraged increased hydration and fiber in diet. Daily probiotics. If bowels not moving can use MOM 2 tbls po in 4 oz of warm prune juice by mouth every 2-3 days. If no results then repeat in 4 hours with  Dulcolax suppository pr, may repeat again in 4 more hours as needed. Seek care if symptoms worsen. Consider daily Miralax and/or Dulcolax if symptoms persist.

## 2024-05-18 NOTE — Assessment & Plan Note (Signed)
 Encouraged DASH or MIND diet, decrease po intake and increase exercise as tolerated. Needs 7-8 hours of sleep nightly. Avoid trans fats, eat small, frequent meals every 4-5 hours with lean proteins, complex carbs and healthy fats. Minimize simple carbs, high fat foods and processed foods. She has been engaging in intermittent fasting.

## 2024-05-18 NOTE — Assessment & Plan Note (Signed)
 Continue to monitor

## 2024-05-18 NOTE — Assessment & Plan Note (Signed)
 Encourage heart healthy diet such as MIND or DASH diet, increase exercise, avoid trans fats, simple carbohydrates and processed foods, consider a krill or fish or flaxseed oil cap daily.

## 2024-05-18 NOTE — Assessment & Plan Note (Signed)
Encouraged complete cessation. 

## 2024-05-18 NOTE — Assessment & Plan Note (Signed)
 hgba1c acceptable, minimize simple carbs. Increase exercise as tolerated.

## 2024-05-19 ENCOUNTER — Ambulatory Visit (INDEPENDENT_AMBULATORY_CARE_PROVIDER_SITE_OTHER): Admitting: Family Medicine

## 2024-05-19 ENCOUNTER — Encounter: Payer: Self-pay | Admitting: Family Medicine

## 2024-05-19 VITALS — BP 118/82 | HR 74 | Temp 98.1°F | Resp 16 | Ht 66.0 in | Wt 200.6 lb

## 2024-05-19 DIAGNOSIS — R739 Hyperglycemia, unspecified: Secondary | ICD-10-CM

## 2024-05-19 DIAGNOSIS — Z720 Tobacco use: Secondary | ICD-10-CM | POA: Diagnosis not present

## 2024-05-19 DIAGNOSIS — K5900 Constipation, unspecified: Secondary | ICD-10-CM

## 2024-05-19 DIAGNOSIS — E782 Mixed hyperlipidemia: Secondary | ICD-10-CM | POA: Diagnosis not present

## 2024-05-19 DIAGNOSIS — E038 Other specified hypothyroidism: Secondary | ICD-10-CM

## 2024-05-19 DIAGNOSIS — E6609 Other obesity due to excess calories: Secondary | ICD-10-CM
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
## 2024-05-29 ENCOUNTER — Ambulatory Visit (INDEPENDENT_AMBULATORY_CARE_PROVIDER_SITE_OTHER): Admitting: Family Medicine

## 2024-06-05 ENCOUNTER — Ambulatory Visit (INDEPENDENT_AMBULATORY_CARE_PROVIDER_SITE_OTHER): Admitting: Nurse Practitioner
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
## 2024-06-08 NOTE — Assessment & Plan Note (Signed)
 Encouraged DASH or MIND diet, decrease po intake and increase exercise as tolerated. Needs 7-8 hours of sleep nightly. Avoid trans fats, eat small, frequent meals every 4-5 hours with lean proteins, complex carbs and healthy fats. Minimize simple carbs, high fat foods and processed foods. She has been engaging in intermittent fasting. She has been working hard on diet and activity levels

## 2024-06-08 NOTE — Assessment & Plan Note (Signed)
 hgba1c acceptable, minimize simple carbs. Increase exercise as tolerated.

## 2024-06-08 NOTE — Assessment & Plan Note (Signed)
Encouraged complete cessation. 

## 2024-06-08 NOTE — Assessment & Plan Note (Signed)
 Works with psychiatry

## 2024-06-08 NOTE — Assessment & Plan Note (Signed)
 Encourage heart healthy diet such as MIND or DASH diet, increase exercise, avoid trans fats, simple carbohydrates and processed foods, consider a krill or fish or flaxseed oil cap daily.

## 2024-06-09 ENCOUNTER — Telehealth: Admitting: Family Medicine

## 2024-06-09 ENCOUNTER — Encounter: Payer: Self-pay | Admitting: Family Medicine

## 2024-06-09 VITALS — BP 127/88 | HR 72 | Resp 16 | Ht 66.0 in | Wt 191.0 lb

## 2024-06-09 DIAGNOSIS — Z6830 Body mass index (BMI) 30.0-30.9, adult: Secondary | ICD-10-CM | POA: Diagnosis not present

## 2024-06-09 DIAGNOSIS — E88819 Insulin resistance, unspecified: Secondary | ICD-10-CM

## 2024-06-09 DIAGNOSIS — Z720 Tobacco use: Secondary | ICD-10-CM | POA: Diagnosis not present

## 2024-06-09 DIAGNOSIS — E782 Mixed hyperlipidemia: Secondary | ICD-10-CM

## 2024-06-09 DIAGNOSIS — R739 Hyperglycemia, unspecified: Secondary | ICD-10-CM

## 2024-06-09 DIAGNOSIS — F3110 Bipolar disorder, current episode manic without psychotic features, unspecified: Secondary | ICD-10-CM

## 2024-06-09 DIAGNOSIS — E6609 Other obesity due to excess calories: Secondary | ICD-10-CM

## 2024-06-09 MED ORDER — METFORMIN HCL ER 500 MG PO TB24: ORAL_TABLET | ORAL | 5 refills | Status: AC

## 2024-07-01 ENCOUNTER — Ambulatory Visit (INDEPENDENT_AMBULATORY_CARE_PROVIDER_SITE_OTHER): Admitting: Family Medicine

## 2024-07-13 ENCOUNTER — Telehealth: Admitting: Family

## 2024-07-13 DIAGNOSIS — R197 Diarrhea, unspecified: Secondary | ICD-10-CM | POA: Diagnosis not present

## 2024-07-13 DIAGNOSIS — R112 Nausea with vomiting, unspecified: Secondary | ICD-10-CM

## 2024-07-13 MED ORDER — ONDANSETRON 4 MG PO TBDP: 4.0000 mg | ORAL_TABLET | Freq: Three times a day (TID) | ORAL | 0 refills | Status: AC | PRN
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
## 2024-07-15 ENCOUNTER — Ambulatory Visit: Admitting: Dermatology

## 2024-07-15 ENCOUNTER — Encounter: Payer: Self-pay | Admitting: Dermatology

## 2024-07-15 VITALS — BP 138/84

## 2024-07-15 DIAGNOSIS — L732 Hidradenitis suppurativa: Secondary | ICD-10-CM | POA: Diagnosis not present

## 2024-07-15 DIAGNOSIS — L309 Dermatitis, unspecified: Secondary | ICD-10-CM

## 2024-07-15 MED ORDER — CLINDAMYCIN PHOSPHATE 1 % EX LOTN: TOPICAL_LOTION | CUTANEOUS | 11 refills | Status: AC

## 2024-07-15 MED ORDER — MUPIROCIN 2 % EX OINT: 1.0000 | TOPICAL_OINTMENT | Freq: Two times a day (BID) | CUTANEOUS | 5 refills | Status: AC

## 2024-07-15 NOTE — Progress Notes (Signed)
" ° °  Follow-Up Visit   Subjective  Phyllis Washington is a 44 y.o. female established patient who presents for FOLLOW UP on the diagnoses listed below:  Patient was last evaluated on 01/10/24.   HS: Pt was instructed to apply clindamycin  lotion to axillae and wash areas daily with BPO. Pt is pleased with results as she has remained clear except for small flares around her menstrual. She noted that she would need refills of clindamycin  lotion today.    Are you nursing, pregnant or trying to conceive? no   The following portions of the chart were reviewed this encounter and updated as appropriate: medications, allergies, medical history  Review of Systems:  No other skin or systemic complaints except as noted in HPI or Assessment and Plan.  Objective  Well appearing patient in no apparent distress; mood and affect are within normal limits.   A focused examination was performed of the following areas: B/L axillae   Relevant exam findings are noted in the Assessment and Plan.        Assessment & Plan   HIDRADENITIS SUPPURATIVA  Pt presented with Chronic hidradenitis suppurativa with flares during menstrual periods. Current management with topical treatments has been effective in reducing severity. No oral medications due to potential interactions with other medications. stable  Treatment Plan: - Encouraged pt to decrease smoking - Continue using clindamycin  lotion daily. Refills sent.  - Continue washing areas with BPO - Rx mupirocin  oint - apply BID until clear for open areas - Will add oral Rx if needed at next OV  - Encouraged pt to use clindamycin  & BPO at the groin for flares also  Chronic scalp dermatitis Persistent itching and sores. Previous treatment with Dupixent was effective for itching but not for sores, discontinued due to insurance coverage issues.   Currently using Head and Shoulders shampoo. HIDRADENITIS SUPPURATIVA   This Visit - clindamycin  (CLEOCIN -T) 1  % lotion - Apply topically daily at underarms - mupirocin  ointment (BACTROBAN ) 2 % - Apply 1 Application topically 2 (two) times daily. Use until clear.  Return in about 1 year (around 07/15/2025) for HS.   Documentation: I have reviewed the above documentation for accuracy and completeness, and I agree with the above.  I, Shirron Maranda, CMA II, am acting as scribe for:  Delon Lenis, DO "

## 2024-07-15 NOTE — Patient Instructions (Signed)

## 2024-07-22 ENCOUNTER — Ambulatory Visit: Admitting: Nurse Practitioner

## 2024-07-29 ENCOUNTER — Telehealth: Admitting: Nurse Practitioner

## 2024-07-29 DIAGNOSIS — E669 Obesity, unspecified: Secondary | ICD-10-CM | POA: Diagnosis not present

## 2024-07-29 DIAGNOSIS — E88819 Insulin resistance, unspecified: Secondary | ICD-10-CM | POA: Diagnosis not present

## 2024-07-29 NOTE — Progress Notes (Signed)
 "  Office: [phone]  /  Fax: [phone]  WEIGHT SUMMARY AND BIOMETRICS  TeleHealth Visit:  This visit was completed with telemedicine (audio/video) technology. Phyllis Washington has verbally consented to this TeleHealth visit. The patient is located at home in [place], the provider is located at [facility]. The participants in this visit include the listed provider and patient. The visit was conducted today via MyChart video.  HPI  Chief Complaint: OBESITY  Phyllis Washington is here via video visit to discuss her progress with her obesity treatment plan.    Interval History:  Reported weight:  187  She was seen last by Dr. Midge on 04/24/24.  She is following IF 1pm-7pm.  She is tracking and is averaging around 1000 calories, 85-130 grams of protein, <50 grams of carbs and 28 fats.  She is eating blueberries and strawberries only.  Has been avoiding all other fruits.   She is drinking water and black coffee daily. She is exercising 3-5 days per week-cardio 5 days per week-  she is using either an elliptical (20-30 mins), stair climber (20-30 mins) or treadmill (30-60 mins)- doing resistance training 3 days-she is going to [employer].  Doesn't reports polyphagia or cravings.    Last labs 04/24/24  Pharmacotherapy for weight loss: She is not currently taking medications  for medical weight loss.  Denies side effects.    Previous pharmacotherapy for medical weight loss:  Metformin   Bariatric surgery:  Patient  has not had bariatric surgery.    Insulin  Resistance Last fasting insulin  was 7.6. A1c was 5.3. Polyphagia:No Medication(s): Metformin  XR 2 tablets at lunch and 1 dinnertime.  Denies side effects.  Managed by PCP  Lab Results  Component Value Date   HGBA1C 5.3 12/25/2023   HGBA1C 5.4 11/08/2023   HGBA1C 5.5 08/29/2023   HGBA1C 5.6 05/29/2023   HGBA1C 5.6 02/19/2023   Lab Results  Component Value Date   INSULIN  7.6 04/24/2024   INSULIN  8.9 12/25/2023     PHYSICAL  EXAM:  There were no vitals taken for this visit. There is no height or weight on file to calculate BMI.  General: She is overweight, cooperative, alert, well developed, and in no acute distress. PSYCH: Has normal mood, affect and thought process.   Extremities: No edema.  Neurologic: No gross sensory or motor deficits. No tremors or fasciculations noted.    DIAGNOSTIC DATA REVIEWED:  BMET    Component Value Date/Time   NA 136 04/24/2024 1039   K 4.4 04/24/2024 1039   CL 102 04/24/2024 1039   CO2 19 (L) 04/24/2024 1039   GLUCOSE 94 04/24/2024 1039   GLUCOSE 103 (H) 11/08/2023 1041   BUN 16 04/24/2024 1039   CREATININE 0.88 04/24/2024 1039   CREATININE 0.77 09/18/2013 1024   CALCIUM 9.7 04/24/2024 1039   GFRNONAA >60 04/08/2008 1319   GFRAA  04/08/2008 1319    >60        The eGFR has been calculated using the MDRD equation. This calculation has not been validated in all clinical   Lab Results  Component Value Date   HGBA1C 5.3 12/25/2023   HGBA1C 5.9 12/24/2015   Lab Results  Component Value Date   INSULIN  7.6 04/24/2024   INSULIN  8.9 12/25/2023   Lab Results  Component Value Date   TSH 4.820 (H) 04/24/2024   CBC    Component Value Date/Time   WBC 8.7 12/25/2023 1113   WBC 9.1 11/08/2023 1041   RBC 4.24 12/25/2023 1113   RBC  4.35 11/08/2023 1041   HGB 12.1 12/25/2023 1113   HCT 38.4 12/25/2023 1113   PLT 285 12/25/2023 1113   MCV 91 12/25/2023 1113   MCH 28.5 12/25/2023 1113   MCH 30.2 09/18/2013 1024   MCHC 31.5 12/25/2023 1113   MCHC 32.6 11/08/2023 1041   RDW 12.3 12/25/2023 1113   Iron Studies No results found for: IRON, TIBC, FERRITIN, IRONPCTSAT Lipid Panel     Component Value Date/Time   CHOL 205 (H) 04/24/2024 1039   TRIG 95 04/24/2024 1039   HDL 53 04/24/2024 1039   CHOLHDL 3.9 04/24/2024 1039   CHOLHDL 5 11/08/2023 1041   VLDL 24.8 11/08/2023 1041   LDLCALC 135 (H) 04/24/2024 1039   LDLDIRECT 138.0 10/02/2022 0959    Hepatic Function Panel     Component Value Date/Time   PROT 6.3 12/25/2023 1113   ALBUMIN 4.3 12/25/2023 1113   AST 16 12/25/2023 1113   ALT 11 12/25/2023 1113   ALKPHOS 79 12/25/2023 1113   BILITOT 0.2 12/25/2023 1113   BILIDIR 0.1 09/18/2013 1024   IBILI 0.2 09/18/2013 1024      Component Value Date/Time   TSH 4.820 (H) 04/24/2024 1039   Nutritional No results found for: VD25OH   ASSESSMENT AND PLAN  TREATMENT PLAN FOR OBESITY:  Recommended Dietary Goals  Phyllis Washington is currently in the action stage of change. As such, her goal is to continue weight management plan. She has agreed to keeping a food journal and adhering to recommended goals of 1300-1400 calories and 100 grams protein. Discussed with the patient that she is not eating enough calories and needs to increase her calorie intake.  Will continue to track and will review at next visit.  Patient reports she is burning over 1000 calories during exercising.  I will review her app and exercise in more detail during her next visit.  Would like to see what her bio impedance is showing.  She notes she recently purchased a scale that she will be able to  monitor her body fat % and muscle mass more closely at home.   Behavioral Intervention  We discussed the following Behavioral Modification Strategies today: increasing vegetables, increasing fiber rich foods, increasing water intake , work on tracking and journaling calories using tracking application, reading food labels , keeping healthy foods at home, planning for success, continue to work on maintaining a reduced calorie state, getting the recommended amount of protein, incorporating whole foods, making healthy choices, staying well hydrated and practicing mindfulness when eating., and increase protein intake, fibrous foods (25 grams per day for women, 30 grams for men) and water to improve satiety and decrease hunger signals. .  Additional resources provided today:  NA  Recommended Physical Activity Goals  Phyllis Washington has been advised to work up to 150 minutes of moderate intensity aerobic activity a week and strengthening exercises 2-3 times per week for cardiovascular health, weight loss maintenance and preservation of muscle mass.   She has agreed to Continue current level of physical activity , Think about enjoyable ways to increase daily physical activity and overcoming barriers to exercise, Increase physical activity in their day and reduce sedentary time (increase NEAT)., Continue to gradually increase the amount and intensity of exercise routine, and Combine aerobic and strengthening exercises for efficiency and improved cardiometabolic health.   ASSOCIATED CONDITIONS ADDRESSED TODAY  Action/Plan  Insulin  resistance Heddy will continue to work on weight loss, exercise, and decreasing simple carbohydrates to help decrease the risk of diabetes. Neila  agreed to follow-up with us  as directed to closely monitor her progress.   Continue to follow up with PCP.  Continue meds as directed.    Generalized obesity      She is scheduled for labs with PCP in March   Return in about 2 weeks (around 08/12/2024).SABRA She was informed of the importance of frequent follow up visits to maximize her success with intensive lifestyle modifications for her multiple health conditions.   ATTESTASTION STATEMENTS:  Reviewed by clinician on day of visit: allergies, medications, problem list, medical history, surgical history, family history, social history, and previous encounter notes.   I personally spent a total of 45 minutes in the care of the patient today including preparing to see the patient, getting/reviewing separately obtained history, counseling and educating, documenting clinical information in the EHR, independently interpreting results, and communicating results.    Corean SAUNDERS. Phyllis Hairston FNP-C "

## 2024-08-14 ENCOUNTER — Ambulatory Visit: Admitting: Nurse Practitioner

## 2024-09-04 ENCOUNTER — Ambulatory Visit: Admitting: Student

## 2024-09-04 ENCOUNTER — Ambulatory Visit: Admitting: Family Medicine

## 2024-10-21 ENCOUNTER — Ambulatory Visit

## 2024-10-22 ENCOUNTER — Ambulatory Visit

## 2025-01-13 ENCOUNTER — Ambulatory Visit: Admitting: Dermatology
